# Patient Record
Sex: Male | Born: 1955 | Race: White | Hispanic: No | Marital: Married | Smoking: Never smoker
Health system: Southern US, Community
[De-identification: ages and names within clinical notes are randomized; demographics above are authoritative.]

## PROBLEM LIST (undated history)

## (undated) DIAGNOSIS — J45909 Unspecified asthma, uncomplicated: Secondary | ICD-10-CM

## (undated) DIAGNOSIS — Z8719 Personal history of other diseases of the digestive system: Secondary | ICD-10-CM

## (undated) DIAGNOSIS — M1990 Unspecified osteoarthritis, unspecified site: Secondary | ICD-10-CM

## (undated) DIAGNOSIS — K859 Acute pancreatitis without necrosis or infection, unspecified: Secondary | ICD-10-CM

## (undated) DIAGNOSIS — K851 Biliary acute pancreatitis without necrosis or infection: Secondary | ICD-10-CM

## (undated) DIAGNOSIS — A0472 Enterocolitis due to Clostridium difficile, not specified as recurrent: Secondary | ICD-10-CM

## (undated) DIAGNOSIS — K469 Unspecified abdominal hernia without obstruction or gangrene: Secondary | ICD-10-CM

## (undated) DIAGNOSIS — G35 Multiple sclerosis: Secondary | ICD-10-CM

## (undated) DIAGNOSIS — Z9889 Other specified postprocedural states: Secondary | ICD-10-CM

## (undated) DIAGNOSIS — I10 Essential (primary) hypertension: Secondary | ICD-10-CM

## (undated) DIAGNOSIS — M545 Low back pain, unspecified: Secondary | ICD-10-CM

## (undated) HISTORY — DX: Multiple sclerosis: G35

## (undated) HISTORY — DX: Biliary acute pancreatitis without necrosis or infection: K85.10

## (undated) HISTORY — PX: KNEE ARTHROSCOPY: SUR90

## (undated) HISTORY — DX: Personal history of other diseases of the digestive system: Z87.19

## (undated) HISTORY — DX: Enterocolitis due to Clostridium difficile, not specified as recurrent: A04.72

## (undated) HISTORY — PX: HERNIA REPAIR: SHX51

## (undated) HISTORY — DX: Low back pain, unspecified: M54.50

## (undated) HISTORY — DX: Low back pain: M54.5

## (undated) HISTORY — PX: TONSILLECTOMY: SUR1361

## (undated) HISTORY — DX: Other specified postprocedural states: Z98.890

## (undated) HISTORY — DX: Acute pancreatitis without necrosis or infection, unspecified: K85.90

## (undated) HISTORY — PX: NOSE SURGERY: SHX723

## (undated) HISTORY — PX: WISDOM TOOTH EXTRACTION: SHX21

## (undated) HISTORY — DX: Unspecified abdominal hernia without obstruction or gangrene: K46.9

## (undated) HISTORY — PX: CYSTECTOMY: SUR359

## (undated) HISTORY — DX: Essential (primary) hypertension: I10

---

## 2000-02-03 ENCOUNTER — Ambulatory Visit (HOSPITAL_COMMUNITY): Admission: RE | Admit: 2000-02-03 | Discharge: 2000-02-03 | Payer: Self-pay | Admitting: Internal Medicine

## 2000-02-03 ENCOUNTER — Encounter: Payer: Self-pay | Admitting: Internal Medicine

## 2000-03-06 ENCOUNTER — Ambulatory Visit (HOSPITAL_BASED_OUTPATIENT_CLINIC_OR_DEPARTMENT_OTHER): Admission: RE | Admit: 2000-03-06 | Discharge: 2000-03-06 | Payer: Self-pay | Admitting: Orthopedic Surgery

## 2000-03-06 ENCOUNTER — Encounter (INDEPENDENT_AMBULATORY_CARE_PROVIDER_SITE_OTHER): Payer: Self-pay | Admitting: Specialist

## 2000-09-11 ENCOUNTER — Encounter: Admission: RE | Admit: 2000-09-11 | Discharge: 2000-09-11 | Payer: Self-pay | Admitting: General Surgery

## 2000-09-11 ENCOUNTER — Encounter: Payer: Self-pay | Admitting: General Surgery

## 2000-09-13 ENCOUNTER — Ambulatory Visit (HOSPITAL_BASED_OUTPATIENT_CLINIC_OR_DEPARTMENT_OTHER): Admission: RE | Admit: 2000-09-13 | Discharge: 2000-09-13 | Payer: Self-pay | Admitting: General Surgery

## 2000-09-13 ENCOUNTER — Encounter (INDEPENDENT_AMBULATORY_CARE_PROVIDER_SITE_OTHER): Payer: Self-pay | Admitting: *Deleted

## 2001-10-03 ENCOUNTER — Ambulatory Visit (HOSPITAL_COMMUNITY): Admission: RE | Admit: 2001-10-03 | Discharge: 2001-10-03 | Payer: Self-pay | Admitting: Neurosurgery

## 2001-10-03 ENCOUNTER — Encounter: Payer: Self-pay | Admitting: Internal Medicine

## 2005-08-18 ENCOUNTER — Ambulatory Visit: Payer: Self-pay | Admitting: Internal Medicine

## 2005-09-12 ENCOUNTER — Ambulatory Visit (HOSPITAL_COMMUNITY): Admission: RE | Admit: 2005-09-12 | Discharge: 2005-09-12 | Payer: Self-pay | Admitting: Internal Medicine

## 2005-12-02 ENCOUNTER — Ambulatory Visit (HOSPITAL_COMMUNITY): Admission: RE | Admit: 2005-12-02 | Discharge: 2005-12-02 | Payer: Self-pay | Admitting: Internal Medicine

## 2005-12-02 ENCOUNTER — Ambulatory Visit: Payer: Self-pay | Admitting: Internal Medicine

## 2006-05-08 ENCOUNTER — Ambulatory Visit: Payer: Self-pay | Admitting: Internal Medicine

## 2006-05-08 LAB — CONVERTED CEMR LAB
ALT: 11 units/L (ref 0–40)
AST: 14 units/L (ref 0–37)
Albumin: 3.8 g/dL (ref 3.5–5.2)
Alkaline Phosphatase: 64 units/L (ref 39–117)
BUN: 7 mg/dL (ref 6–23)
Basophils Absolute: 0 10*3/uL (ref 0.0–0.1)
Calcium: 9.2 mg/dL (ref 8.4–10.5)
Chloride: 101 meq/L (ref 96–112)
Cholesterol: 167 mg/dL (ref 0–200)
Creatinine, Ser: 0.8 mg/dL (ref 0.4–1.5)
Eosinophils Absolute: 0.1 10*3/uL (ref 0.0–0.6)
GFR calc non Af Amer: 108 mL/min
HDL: 41.6 mg/dL (ref >39.0)
Hemoglobin, Urine: NEGATIVE
Ketones, ur: NEGATIVE mg/dL
LDL Cholesterol: 105 mg/dL — ABNORMAL HIGH (ref 0–99)
MCHC: 33.5 g/dL (ref 30.0–36.0)
MCV: 91.4 fL (ref 78.0–100.0)
Nitrite: NEGATIVE
PSA: 0.97 ng/mL (ref 0.10–4.00)
Platelets: 193 10*3/uL (ref 150–400)
RBC: 4.73 M/uL (ref 4.22–5.81)
RDW: 11.5 % (ref 11.5–14.6)
Sodium: 139 meq/L (ref 135–145)
Total Bilirubin: 0.6 mg/dL (ref 0.3–1.2)
Total CHOL/HDL Ratio: 4
Urine Glucose: NEGATIVE mg/dL
Urobilinogen, UA: 0.2 (ref 0.0–1.0)
VLDL: 20 mg/dL (ref 0–40)
pH: 8 (ref 5.0–8.0)

## 2006-05-17 ENCOUNTER — Ambulatory Visit: Payer: Self-pay | Admitting: Internal Medicine

## 2006-05-17 ENCOUNTER — Ambulatory Visit (HOSPITAL_COMMUNITY): Admission: RE | Admit: 2006-05-17 | Discharge: 2006-05-17 | Payer: Self-pay | Admitting: Internal Medicine

## 2006-05-21 ENCOUNTER — Ambulatory Visit: Payer: Self-pay | Admitting: Internal Medicine

## 2006-06-01 ENCOUNTER — Ambulatory Visit: Payer: Self-pay | Admitting: Internal Medicine

## 2006-06-01 LAB — CONVERTED CEMR LAB
Anti Nuclear Antibody(ANA): NEGATIVE
Rhuematoid fact SerPl-aCnc: <20 intl units/mL — ABNORMAL LOW (ref 0.0–20.0)
Vit D, 1,25-Dihydroxy: 24 (ref 20–57)

## 2006-06-09 ENCOUNTER — Encounter: Admission: RE | Admit: 2006-06-09 | Discharge: 2006-06-09 | Payer: Self-pay | Admitting: Internal Medicine

## 2007-04-04 ENCOUNTER — Encounter: Payer: Self-pay | Admitting: Internal Medicine

## 2007-06-07 ENCOUNTER — Ambulatory Visit (HOSPITAL_COMMUNITY)
Admission: RE | Admit: 2007-06-07 | Discharge: 2007-06-07 | Payer: Self-pay | Admitting: Physical Medicine and Rehabilitation

## 2007-06-20 ENCOUNTER — Encounter: Payer: Self-pay | Admitting: Internal Medicine

## 2007-06-27 ENCOUNTER — Encounter: Payer: Self-pay | Admitting: Internal Medicine

## 2007-07-04 ENCOUNTER — Encounter: Admission: RE | Admit: 2007-07-04 | Discharge: 2007-07-04 | Payer: Self-pay | Admitting: Neurology

## 2007-07-24 ENCOUNTER — Encounter (INDEPENDENT_AMBULATORY_CARE_PROVIDER_SITE_OTHER): Payer: Self-pay | Admitting: Neurology

## 2007-07-24 ENCOUNTER — Ambulatory Visit (HOSPITAL_COMMUNITY): Admission: RE | Admit: 2007-07-24 | Discharge: 2007-07-24 | Payer: Self-pay | Admitting: Neurology

## 2008-05-14 ENCOUNTER — Ambulatory Visit: Payer: Self-pay | Admitting: Internal Medicine

## 2008-05-14 DIAGNOSIS — R059 Cough, unspecified: Secondary | ICD-10-CM | POA: Insufficient documentation

## 2008-05-14 DIAGNOSIS — G35 Multiple sclerosis: Secondary | ICD-10-CM | POA: Insufficient documentation

## 2008-05-14 DIAGNOSIS — R05 Cough: Secondary | ICD-10-CM

## 2008-08-24 ENCOUNTER — Emergency Department (HOSPITAL_COMMUNITY): Admission: EM | Admit: 2008-08-24 | Discharge: 2008-08-24 | Payer: Self-pay | Admitting: Emergency Medicine

## 2008-10-02 ENCOUNTER — Emergency Department (HOSPITAL_BASED_OUTPATIENT_CLINIC_OR_DEPARTMENT_OTHER): Admission: EM | Admit: 2008-10-02 | Discharge: 2008-10-02 | Payer: Self-pay | Admitting: Emergency Medicine

## 2008-10-02 ENCOUNTER — Ambulatory Visit: Payer: Self-pay | Admitting: Diagnostic Radiology

## 2009-04-13 ENCOUNTER — Ambulatory Visit: Payer: Self-pay | Admitting: Internal Medicine

## 2009-04-13 DIAGNOSIS — J069 Acute upper respiratory infection, unspecified: Secondary | ICD-10-CM

## 2009-04-19 ENCOUNTER — Ambulatory Visit: Payer: Self-pay | Admitting: Internal Medicine

## 2009-04-19 ENCOUNTER — Encounter: Payer: Self-pay | Admitting: Internal Medicine

## 2009-04-19 DIAGNOSIS — J45901 Unspecified asthma with (acute) exacerbation: Secondary | ICD-10-CM

## 2009-05-03 ENCOUNTER — Ambulatory Visit: Payer: Self-pay | Admitting: Internal Medicine

## 2009-06-09 ENCOUNTER — Ambulatory Visit: Payer: Self-pay | Admitting: Internal Medicine

## 2009-06-09 LAB — CONVERTED CEMR LAB
AST: 32 units/L (ref 0–37)
BUN: 9 mg/dL (ref 6–23)
Basophils Absolute: 0.1 10*3/uL (ref 0.0–0.1)
Bilirubin, Direct: 0 mg/dL (ref 0.0–0.3)
Cholesterol: 191 mg/dL (ref 0–200)
Eosinophils Absolute: 0.1 10*3/uL (ref 0.0–0.7)
GFR calc non Af Amer: 82.65 mL/min (ref >60)
Leukocytes, UA: NEGATIVE
Lymphocytes Relative: 30.1 % (ref 12.0–46.0)
MCHC: 34.3 g/dL (ref 30.0–36.0)
Neutrophils Relative %: 55.8 % (ref 43.0–77.0)
Nitrite: NEGATIVE
PSA: 0.99 ng/mL (ref 0.10–4.00)
Potassium: 4.5 meq/L (ref 3.5–5.1)
RBC: 4.34 M/uL (ref 4.22–5.81)
RDW: 12.5 % (ref 11.5–14.6)
Specific Gravity, Urine: 1.01 (ref 1.000–1.030)
TSH: 1.72 microintl units/mL (ref 0.35–5.50)
Total Bilirubin: 0.5 mg/dL (ref 0.3–1.2)
Total CHOL/HDL Ratio: 5
Triglycerides: 263 mg/dL — ABNORMAL HIGH (ref 0.0–149.0)
Urobilinogen, UA: 0.2 (ref 0.0–1.0)
VLDL: 52.6 mg/dL — ABNORMAL HIGH (ref 0.0–40.0)
Vitamin B-12: 494 pg/mL (ref 211–911)

## 2009-06-14 ENCOUNTER — Ambulatory Visit: Payer: Self-pay | Admitting: Internal Medicine

## 2009-06-14 DIAGNOSIS — M545 Low back pain, unspecified: Secondary | ICD-10-CM | POA: Insufficient documentation

## 2009-06-14 DIAGNOSIS — E559 Vitamin D deficiency, unspecified: Secondary | ICD-10-CM

## 2009-06-14 DIAGNOSIS — L219 Seborrheic dermatitis, unspecified: Secondary | ICD-10-CM | POA: Insufficient documentation

## 2009-08-04 ENCOUNTER — Encounter: Admission: RE | Admit: 2009-08-04 | Discharge: 2009-11-01 | Payer: Self-pay | Admitting: Neurology

## 2009-09-28 ENCOUNTER — Encounter (INDEPENDENT_AMBULATORY_CARE_PROVIDER_SITE_OTHER): Payer: Self-pay | Admitting: *Deleted

## 2009-10-13 ENCOUNTER — Telehealth: Payer: Self-pay | Admitting: Internal Medicine

## 2009-10-15 ENCOUNTER — Ambulatory Visit: Payer: Self-pay | Admitting: Internal Medicine

## 2009-10-15 DIAGNOSIS — G4700 Insomnia, unspecified: Secondary | ICD-10-CM

## 2009-10-28 ENCOUNTER — Encounter: Payer: Self-pay | Admitting: Internal Medicine

## 2010-01-28 ENCOUNTER — Telehealth: Payer: Self-pay | Admitting: Internal Medicine

## 2010-03-08 NOTE — Letter (Signed)
Summary: Out of Work  [facility]  [address] [place], [place] [address]   Phone: [phone]  Fax: [phone]    April 19, 2009   Employee:  [name] [facility] [employer]    To Whom It May Concern:   For Medical reasons, please excuse the above named employee from work for the following dates:  Start:   04/13/09  End:   04/26/09  If you need additional information, please feel free to contact our office.         Sincerely,    Etta Grandchild MD

## 2010-03-08 NOTE — Letter (Signed)
Summary: [facility]  [facility]   Imported By: Lester  11/08/2009 09:40:00  _____________________________________________________________________  External Attachment:    Type:   Image     Comment:   External Document

## 2010-03-08 NOTE — Assessment & Plan Note (Signed)
Summary: 4 month follow up-lb   Vital Signs:  Patient profile:   55 year old male Height:      57 inches Weight:      165 pounds BMI:     35.83 Temp:     97.2 degrees F oral Pulse rate:   72 / minute Pulse rhythm:   regular Resp:     16 per minute BP sitting:   118 / 80  (left arm) Cuff size:   regular  Vitals Entered By: Lanier Prude, Beverly Gust) (October 15, 2009 7:52 AM) CC: f/u Is Patient Diabetic? No   Primary Care Provider:  Tresa Garter MD  CC:  f/u.  History of Present Illness: F/u MS and LBP; hard to sleep  Current Medications (verified): 1)  Copaxone 20 Mg/ml Kit (Glatiramer Acetate) .... Take 1 Injection Q Qhs 2)  Multivitamins  Tabs (Multiple Vitamin) .... Take 1 By Mouth Qd 3)  Zyrtec Allergy 10 Mg Tabs (Cetirizine Hcl) .[name].. 1 By Mouth Daily As Needed For Allergies 4)  Vitamin D 1000 Unit Tabs (Cholecalciferol) .... 2 By Mouth Once Daily 5)  Tramadol Hcl 50 Mg Tabs (Tramadol Hcl) .[name].. 1-2 Tabs By Mouth Two Times A Day As Needed Pain 6)  Prednisone 10 Mg Tabs (Prednisone) .[name].. 1 Once Daily  Allergies (verified): No Known Drug Allergies  Past History:  Past Medical History: Last updated: 06/14/2009 MS - dx 6/09 - Dr. Vickey Huger Ortho Dr Ranell Patrick [place] Vit D def Low back pain  Social History: Last updated: 06/14/2009 Marital Status: Married Occupation: Artist, retired Hotel manager since 1995 Never Smoked Alcohol use-no Drug use-no Regular exercise-yes Able to drive  Review of Systems       The patient complains of difficulty walking and depression.  The patient denies fever and chest pain.    Physical Exam  General:  alert, well-developed, well-nourished, and cooperative to examination.    Head:  normocephalic, atraumatic, no abnormalities observed, and no abnormalities palpated.   Nose:  nasal discharge  and mucosal erythema.  no airflow obstruction, no intranasal foreign body, no nasal polyps, no nasal mucosal lesions, no mucosal  friability, no active bleeding or clots, no sinus percussion tenderness, no septum abnormalities, nasal dischargemucosal pallor, and mucosal erythema.   Mouth:  good dentition, pharynx pink and moist, no erythema, no exudates, no posterior lymphoid hypertrophy, no postnasal drip, no pharyngeal crowing, no lesions, no aphthous ulcers, no erosions, no tongue abnormalities, no leukoplakia, and no petechiae.   Neck:  supple, full ROM, no masses, no thyromegaly, no JVD, normal carotid upstroke, no carotid bruits, no cervical lymphadenopathy, and no neck tenderness.   Lungs:  normal respiratory effort, no intercostal retractions, no accessory muscle use, normal breath sounds, no dullness, no fremitus, no crackles, and no wheezes.   Heart:  normal rate, regular rhythm, no murmur, no gallop, no rub, and no JVD.   Abdomen:  soft, non-tender, normal bowel sounds, no distention, no masses, no guarding, no rigidity, no rebound tenderness, no abdominal hernia, no inguinal hernia, no hepatomegaly, and no splenomegaly.   Msk:  No deformity or scoliosis noted of thoracic or lumbar spine.   Neurologic:  No cranial nerve deficits noted. He is ataxic. LE weak B. Skin:  turgor normal, color normal,  no ecchymoses, no petechiae, no purpura, no ulcerations, and no edema.  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 Psych:  memory intact for recent and remote, normally interactive, good eye contact, and not anxious  appearing.     Impression & Recommendations:  Problem # 1:  LOW BACK PAIN (ICD-724.2) Assessment Unchanged  His updated medication list for this problem includes:    Tramadol Hcl 50 Mg Tabs (Tramadol hcl) .[name]... 1-2 tabs by mouth two times a day as needed pain  Problem # 2:  MULTIPLE SCLEROSIS (ICD-340) Assessment: Unchanged On the regimen of medicine(s) reflected in the chart   Try Metanx  Problem # 3:  VITAMIN D DEFICIENCY (ICD-268.9) Assessment: Improved  Problem # 4:  INSOMNIA, CHRONIC  (ICD-307.42) Assessment: Unchanged Neurontin option was discussed  Complete Medication List: 1)  Copaxone 20 Mg/ml Kit (Glatiramer acetate) .... Take 1 injection q qhs 2)  Multivitamins Tabs (Multiple vitamin) .... Take 1 by mouth qd 3)  Zyrtec Allergy 10 Mg Tabs (Cetirizine hcl) .[name].. 1 by mouth daily as needed for allergies 4)  Vitamin D 1000 Unit Tabs (Cholecalciferol) .... 2 by mouth once daily 5)  Tramadol Hcl 50 Mg Tabs (Tramadol hcl) .[name].. 1-2 tabs by mouth two times a day as needed pain 6)  Prednisone 10 Mg Tabs (Prednisone) .[name].. 1 once daily 7)  Metanx 3-35-2 Mg Tabs (L-methylfolate-b6-b12) .... 2 by mouth qd  Patient Instructions: 1)  Please schedule a follow-up appointment in 6 months well w/labs and Vit D and Vit B12 782.0  268.9. Prescriptions: TRAMADOL HCL 50 MG TABS (TRAMADOL HCL) 1-2 tabs by mouth two times a day as needed pain  #120 x 6   Entered and Authorized by:   Tresa Garter MD   Signed by:   Tresa Garter MD on 10/15/2009   Method used:   Print then Give to Patient   RxID:   [id number] VITAMIN D 1000 UNIT TABS (CHOLECALCIFEROL) 2 by mouth once daily  #200 x 3   Entered and Authorized by:   Tresa Garter MD   Signed by:   Tresa Garter MD on 10/15/2009   Method used:   Print then Give to Patient   RxID:   [id number] METANX 3-35-2 MG TABS (L-METHYLFOLATE-B6-B12) 2 by mouth qd  #60 x 12   Entered and Authorized by:   Tresa Garter MD   Signed by:   Tresa Garter MD on 10/15/2009   Method used:   Print then Give to Patient   RxID:   [id number]

## 2010-03-08 NOTE — Assessment & Plan Note (Signed)
Summary: 2 WK ROV /NWS   Vital Signs:  Patient profile:   55 year old male Height:      57 inches Weight:      167 pounds BMI:     36.27 O2 Sat:      97 % on Room air Temp:     98.0 degrees F oral Pulse rate:   62 / minute Pulse rhythm:   regular Resp:     16 per minute BP sitting:   124 / 82  (right arm) Cuff size:   large  Vitals Entered By: Rock Nephew CMA (May 03, 2009 8:24 AM)  Nutrition Counseling: Patient's BMI is greater than 25 and therefore counseled on weight management options.  O2 Flow:  Room air CC: follow-up visit   Primary Care Provider:  Tresa Garter MD  CC:  follow-up visit.  History of Present Illness: He returns for f/up and states that he feels much better.  Asthma History    Initial Asthma Severity Rating:    Age range: 12+ years    Symptoms: 0-2 days/week    Nighttime Awakenings: 0-2/month    Interferes w/ normal activity: no limitations    SABA use (not for EIB): 0-2 days/week    Asthma Severity Assessment: Intermittent   Preventive Screening-Counseling & Management  Alcohol-Tobacco     Alcohol drinks/day: 0     Smoking Status: never  Medications Prior to Update: 1)  Copaxone 20 Mg/ml Kit (Glatiramer Acetate) .... Take 1 Injection Q Qhs 2)  Multivitamins  Tabs (Multiple Vitamin) .... Take 1 By Mouth Qd 3)  Zyrtec Allergy 10 Mg Tabs (Cetirizine Hcl) .[name].. 1 By Mouth Daily As Needed For Allergies 4)  Ampyra 10 Mg Xr12h-Tab (Dalfampridine) .... Two Times A Day 5)  Vitamin D 1000 Unit Tabs (Cholecalciferol) .[name].. 1 By Mouth Qd 6)  Dulera 100-5 Mcg/act Aero (Mometasone Furo-Formoterol Fum) .... 2 Puffs Two Times A Day  Current Medications (verified): 1)  Copaxone 20 Mg/ml Kit (Glatiramer Acetate) .... Take 1 Injection Q Qhs 2)  Multivitamins  Tabs (Multiple Vitamin) .... Take 1 By Mouth Qd 3)  Zyrtec Allergy 10 Mg Tabs (Cetirizine Hcl) .[name].. 1 By Mouth Daily As Needed For Allergies 4)  Vitamin D 1000 Unit Tabs (Cholecalciferol)  .[name].. 1 By Mouth Qd 5)  Dulera 100-5 Mcg/act Aero (Mometasone Furo-Formoterol Fum) .... 2 Puffs Two Times A Day  Allergies (verified): No Known Drug Allergies  Past History:  Past Medical History: Reviewed history from 04/13/2009 and no changes required. MS - dx 6/09 - Dr. Richardean Chimera Ortho Dr Ranell Patrick  Past Surgical History: Reviewed history from 04/19/2009 and no changes required. Tonsillectomy  Family History: Reviewed history from 04/13/2009 and no changes required. No CAD  Social History: Reviewed history from 04/19/2009 and no changes required. 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   Resp:  Denies chest discomfort, chest pain with inspiration, cough, coughing up blood, hypersomnolence, morning headaches, pleuritic, shortness of breath, sputum productive, and wheezing.  Physical Exam  General:  alert, well-developed, well-nourished, and cooperative to examination.    Head:  normocephalic, atraumatic, no abnormalities observed, and no abnormalities palpated.   Mouth:  good dentition, pharynx pink and moist, no erythema, no exudates, no posterior lymphoid  hypertrophy, no postnasal drip, no pharyngeal crowing, no lesions, no aphthous ulcers, no erosions, no tongue abnormalities, no leukoplakia, and no petechiae.   Neck:  supple, full ROM, no masses, no thyromegaly, no JVD, normal carotid upstroke, no carotid bruits, no cervical lymphadenopathy, and no neck tenderness.   Lungs:  normal respiratory effort, no intercostal retractions, no accessory muscle use, normal breath sounds, no dullness, no fremitus, no crackles, and no wheezes.   Heart:  normal rate,  regular rhythm, no murmur, no gallop, no rub, and no JVD.   Abdomen:  soft, non-tender, normal bowel sounds, no distention, no masses, no guarding, no rigidity, no rebound tenderness, no abdominal hernia, no inguinal hernia, no hepatomegaly, and no splenomegaly.   Msk:  No deformity or scoliosis noted of thoracic or lumbar spine.   Pulses:  R and L carotid,radial,femoral,dorsalis pedis and posterior tibial pulses are full and equal bilaterally Extremities:  No clubbing, cyanosis, edema, or deformity noted with normal full range of motion of all joints.   Neurologic:  No cranial nerve deficits noted. Station and gait are normal. Plantar reflexes are down-going bilaterally. DTRs are symmetrical throughout. Sensory, motor and coordinative functions appear intact. Skin:  turgor normal, color normal, no suspicious lesions, no ecchymoses, no petechiae, no purpura, no ulcerations, and no edema.  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 Cervical Nodes:  no anterior cervical adenopathy and no posterior cervical adenopathy.   Psych:  memory intact for recent and remote, normally interactive, good eye contact, and not anxious appearing.     Impression & Recommendations:  Problem # 1:  ASTHMA NOS W/ACUTE EXACERBATION (ICD-493.92) Assessment Improved  His updated medication list for this problem includes:    Dulera 100-5 Mcg/act Aero (Mometasone furo-formoterol fum) .[name]... 2 puffs two times a day  Pulmonary Functions Reviewed: O2 sat: 97 (05/03/2009)  Problem # 2:  UPPER RESPIRATORY INFECTION, ACUTE (ICD-465.9) Assessment: Improved  His updated medication list for this problem includes:    Zyrtec Allergy 10 Mg Tabs (Cetirizine hcl) .[name]... 1 by mouth daily as needed for allergies  Complete Medication List: 1)  Copaxone 20 Mg/ml Kit (Glatiramer acetate) .... Take 1 injection q qhs 2)  Multivitamins Tabs (Multiple vitamin) .... Take 1 by mouth qd 3)  Zyrtec Allergy 10 Mg Tabs (Cetirizine hcl) .[name].. 1 by mouth  daily as needed for allergies 4)  Vitamin D 1000 Unit Tabs (Cholecalciferol) .[name].. 1 by mouth qd 5)  Dulera 100-5 Mcg/act Aero (Mometasone furo-formoterol fum) .... 2 puffs two times a day  Patient Instructions: 1)  Please schedule a follow-up appointment in 4 months. 2)  It is important to use your inhaler properly. Use a spacer, take slow deep breaths and hold them. Rinse your mouth after using.

## 2010-03-08 NOTE — Letter (Signed)
Summary: Referral - not able to see patient  [facility]  [address] [place], [place] [address]   Phone: [phone]  Fax: [phone]    September 28, 2009    [name], M.D. [address] [place], [place] [address]    Re:   [name] DOB:  04-Sep-1955 MRN:   [id number]    Dear Dr. Posey Rea:  Thank you for your kind referral of the above patient.  We have attempted to schedule the recommended procedure Screening Colonoscopy but have not been able to schedule because:   X  The patient was not available by phone and/or has not returned our calls.  ___ The patient declined to schedule the procedure at this time.  We appreciate the referral and hope that we will have the opportunity to treat this patient in the future.    Sincerely,    [facility] [phone]

## 2010-03-08 NOTE — Miscellaneous (Signed)
Summary: [facility]  [facility]   Imported By: Lester Zena 04/21/2009 07:44:17  _____________________________________________________________________  External Attachment:    Type:   Image     Comment:   External Document

## 2010-03-08 NOTE — Assessment & Plan Note (Signed)
Summary: cough,getting worse/plot pt/cd   Vital Signs:  Patient profile:   55 year old male Height:      5.7 inches Weight:      165 pounds BMI:     3583.47 O2 Sat:      96 % on Room air Temp:     97.6 degrees F oral Pulse rate:   66 / minute Pulse rhythm:   regular Resp:     16 per minute BP sitting:   104 / 78  (left arm) Cuff size:   large  Vitals Entered By: Rock Nephew CMA (April 19, 2009 8:59 AM)  Nutrition Counseling: Patient's BMI is greater than 25 and therefore counseled on weight management options.  O2 Flow:  Room air CC: continued cough w/ yellow mucus and occ fever x 04/13/2009, URI symptoms Is Patient Diabetic? No Pain Assessment Patient in pain? no        Primary Care Provider:  Georgina Quint Plotnikov MD  CC:  continued cough w/ yellow mucus and occ fever x 04/13/2009 and URI symptoms.  History of Present Illness:  URI Symptoms      This is a 55 year old man who presents with URI symptoms.  The symptoms began 3 weeks ago.  The severity is described as moderate.  The patient reports productive cough, but denies purulent nasal discharge and sore throat.  Associated symptoms include fever and wheezing.  The patient denies stiff neck, dyspnea, rash, vomiting, diarrhea, use of an antipyretic, and response to antipyretic.  The patient denies sneezing, seasonal symptoms, headache, muscle aches, and severe fatigue.  The patient denies the following risk factors for Strep sinusitis: unilateral facial pain, unilateral nasal discharge, double sickening, Strep exposure, tender adenopathy, and absence of cough.    Preventive Screening-Counseling & Management  Alcohol-Tobacco     Alcohol drinks/day: 0     Smoking Status: never  Caffeine-Diet-Exercise     Does Patient Exercise: yes  Hep-HIV-STD-Contraception     Hepatitis Risk: no risk noted     HIV Risk: no risk noted     STD Risk: no risk noted      Sexual History:  currently monogamous.        Drug Use:  no.          Blood Transfusions:  no.    Medications Prior to Update: 1)  Copaxone 20 Mg/ml Kit (Glatiramer Acetate) .... Take 1 Injection Q Qhs 2)  Multivitamins  Tabs (Multiple Vitamin) .... Take 1 By Mouth Qd 3)  Zyrtec Allergy 10 Mg Tabs (Cetirizine Hcl) .[name].. 1 By Mouth Daily As Needed For Allergies 4)  Ampyra 10 Mg Xr12h-Tab (Dalfampridine) .... Two Times A Day 5)  Doxycycline Monohydrate 100 Mg Caps (Doxycycline Monohydrate) .[name].. 1 By Mouth Two Times A Day With A Glass of Water 6)  Promethazine-Codeine 6.25-10 Mg/37ml Syrp (Promethazine-Codeine) .... 5-10 Ml By Mouth Q Id As Needed Cough 7)  Vitamin D 1000 Unit Tabs (Cholecalciferol) .[name].. 1 By Mouth Qd  Current Medications (verified): 1)  Copaxone 20 Mg/ml Kit (Glatiramer Acetate) .... Take 1 Injection Q Qhs 2)  Multivitamins  Tabs (Multiple Vitamin) .... Take 1 By Mouth Qd 3)  Zyrtec Allergy 10 Mg Tabs (Cetirizine Hcl) .[name].. 1 By Mouth Daily As Needed For Allergies 4)  Ampyra 10 Mg Xr12h-Tab (Dalfampridine) .... Two Times A Day 5)  Doxycycline Monohydrate 100 Mg Caps (Doxycycline Monohydrate) .[name].. 1 By Mouth Two Times A Day With A Glass of Water 6)  Vitamin D  1000 Unit Tabs (Cholecalciferol) .[name].. 1 By Mouth Qd 7)  Dulera 100-5 Mcg/act Aero (Mometasone Furo-Formoterol Fum) .... 2 Puffs Two Times A Day  Allergies (verified): No Known Drug Allergies  Past History:  Past Medical History: Reviewed history from 04/13/2009 and no changes required. MS - dx 6/09 - Dr. Richardean Chimera Ortho Dr Ranell Patrick  Past Surgical History: Tonsillectomy  Family History: Reviewed history from 04/13/2009 and no changes required. No CAD  Social History: Reviewed history from 05/14/2008 and no changes required. 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  The patient denies anorexia, weight gain, vision loss, decreased hearing, hoarseness, chest pain, syncope, peripheral edema, headaches, hemoptysis, abdominal pain, suspicious skin lesions, and enlarged lymph nodes.   Resp:  Complains of cough, shortness of breath, sputum productive, and wheezing; denies chest discomfort, chest pain with inspiration, coughing up blood, excessive snoring, hypersomnolence, morning headaches, and pleuritic.  Physical Exam  General:  alert, well-developed, well-nourished, and cooperative to examination.    Head:  normocephalic, atraumatic, no abnormalities observed, and no abnormalities palpated.   Ears:  R cerumen impaction and L Cerumen impaction.   Nose:  nasal discharge  and mucosal erythema.  no airflow obstruction, no intranasal foreign body, no nasal polyps, no nasal mucosal lesions, no mucosal friability, no active bleeding or clots, no sinus percussion tenderness, no septum abnormalities, nasal dischargemucosal pallor, and mucosal erythema.   Mouth:  good dentition, pharynx pink and moist, no erythema, no exudates, no posterior lymphoid hypertrophy, no postnasal drip, no pharyngeal crowing, no lesions, no aphthous ulcers, no erosions, no tongue abnormalities, no leukoplakia, and no petechiae.   Neck:  supple, full ROM, no masses, no thyromegaly, no JVD, normal carotid upstroke, no carotid bruits, no cervical lymphadenopathy, and no neck tenderness.   Lungs:  R wheezes and rhonchi during expiration, there is some clearing with cough.  left is CTA. there are no rales or decreased breath sounds. Heart:  normal rate, regular rhythm, no murmur, no gallop, no rub, and no JVD.   Abdomen:  soft, non-tender, normal bowel sounds, no distention, no masses, no guarding, no rigidity, no  rebound tenderness, no abdominal hernia, no inguinal hernia, no hepatomegaly, and no splenomegaly.   Msk:  No deformity or scoliosis noted of thoracic or lumbar spine.   Pulses:  R and L carotid,radial,femoral,dorsalis pedis and posterior tibial pulses are full and equal bilaterally Extremities:  No clubbing, cyanosis, edema, or deformity noted with normal full range of motion of all joints.   Neurologic:  No cranial nerve deficits noted. Station and gait are normal. Plantar reflexes are down-going bilaterally. DTRs are symmetrical throughout. Sensory, motor and coordinative functions appear intact. Skin:  turgor normal, color normal, no suspicious lesions, no ecchymoses, no petechiae, no purpura, no ulcerations, and no edema.  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 Cervical Nodes:  no anterior cervical adenopathy and no posterior cervical adenopathy.   Axillary Nodes:  no R axillary adenopathy and no L axillary adenopathy.   Inguinal Nodes:  no R inguinal adenopathy and no L inguinal adenopathy.   Psych:  memory intact for  recent and remote, normally interactive, good eye contact, and not anxious appearing.     Impression & Recommendations:  Problem # 1:  ASTHMA NOS W/ACUTE EXACERBATION (ICD-493.92) Assessment New he was witnessed to do a good inhalation of dulera in the office. His updated medication list for this problem includes:    Dulera 100-5 Mcg/act Aero (Mometasone furo-formoterol fum) .[name]... 2 puffs two times a day  Pulmonary Functions Reviewed: O2 sat: 96 (04/19/2009)  Problem # 2:  UPPER RESPIRATORY INFECTION, ACUTE (ICD-465.9) Assessment: Unchanged  The following medications were removed from the medication list:    Promethazine-codeine 6.25-10 Mg/17ml Syrp (Promethazine-codeine) .[name]... 5-10 ml by mouth q id as needed cough His updated medication list for this problem includes:    Zyrtec Allergy 10 Mg Tabs (Cetirizine hcl) .[name]... 1 by mouth daily as needed for allergies  Instructed  on symptomatic treatment. Call if symptoms persist or worsen.   Problem # 3:  COUGH (ICD-786.2) Assessment: Deteriorated will repeat the chest xray to see if pna has developed Orders: T-2 View CXR ([id number])  Complete Medication List: 1)  Copaxone 20 Mg/ml Kit (Glatiramer acetate) .... Take 1 injection q qhs 2)  Multivitamins Tabs (Multiple vitamin) .... Take 1 by mouth qd 3)  Zyrtec Allergy 10 Mg Tabs (Cetirizine hcl) .[name].. 1 by mouth daily as needed for allergies 4)  Ampyra 10 Mg Xr12h-tab (Dalfampridine) .... Two times a day 5)  Doxycycline Monohydrate 100 Mg Caps (Doxycycline monohydrate) .[name].. 1 by mouth two times a day with a glass of water 6)  Vitamin D 1000 Unit Tabs (Cholecalciferol) .[name].. 1 by mouth qd 7)  Dulera 100-5 Mcg/act Aero (Mometasone furo-formoterol fum) .... 2 puffs two times a day  Patient Instructions: 1)  Please schedule a follow-up appointment in 2 weeks. 2)  It is important to use your inhaler properly. Use a spacer, take slow deep breaths and hold them. Rinse your mouth after using.  3)  Acute bronchitis symptoms for less than 10 days are not helped by antibiotics. take over the counter cough medications. call if no improvment in  5-7 days, sooner if increasing cough, fever, or new symptoms( shortness of breath, chest pain). Prescriptions: DULERA 100-5 MCG/ACT AERO (MOMETASONE FURO-FORMOTEROL FUM) 2 puffs two times a day  #5 inhs x 0   Entered and Authorized by:   Etta Grandchild MD   Signed by:   Etta Grandchild MD on 04/19/2009   Method used:   Samples Given   RxID:   [id number]

## 2010-03-08 NOTE — Progress Notes (Signed)
Summary: SHINGLES  Phone Note Call from Patient Call back at Home Phone [phone]   Summary of Call: Pt's wife called. Dr Corky Crafts agrees with shingles vaccine. Pt would like to have vaccine at office visit on Friday. *Need to call and inform pt to confirm coverage thru insurance prior to office visit.  Initial call taken by: Lamar Sprinkles, CMA,  October 13, 2009 10:48 AM  Follow-up for Phone Call        No answer................Marland KitchenLamar Sprinkles, CMA  October 13, 2009 11:34 AM   Additional Follow-up for Phone Call Additional follow up Details #1::        Left detailed vm on hm # to check w/ins Additional Follow-up by: Lamar Sprinkles, CMA,  October 13, 2009 4:56 PM

## 2010-03-08 NOTE — Assessment & Plan Note (Signed)
Summary: ?pneumonia or flu-lb   Vital Signs:  Patient profile:   55 year old male Weight:      166 pounds BMI:     3605.19 Temp:     96.6 degrees F Pulse rate:   91 / minute BP sitting:   132 / 80  (right arm)  Vitals Entered By: Tora Perches (April 13, 2009 2:55 PM) CC: pt c/o poss, pneu. or flu Is Patient Diabetic? No   Primary Care Provider:  Tresa Garter MD  CC:  pt c/o poss and pneu. or flu.  History of Present Illness: C/o cough and heavy in the chest 3-4 d  Preventive Screening-Counseling & Management  Alcohol-Tobacco     Smoking Status: never  Current Medications (verified): 1)  Copaxone 20 Mg/ml Kit (Glatiramer Acetate) .... Take 1 Injection Q Qhs 2)  Calcium 600/vitamin D 600-400 Mg-Unit Tabs (Calcium Carbonate-Vitamin D) .... Take 1 By Mouth Qd 3)  Multivitamins  Tabs (Multiple Vitamin) .... Take 1 By Mouth Qd 4)  Zyrtec Allergy 10 Mg Tabs (Cetirizine Hcl) .[name].. 1 By Mouth Daily As Needed For Allergies 5)  Ampyra 10 Mg Xr12h-Tab (Dalfampridine) .... Two Times A Day  Allergies (verified): No Known Drug Allergies  Past History:  Past Surgical History: Last updated: 05/14/2008 none  Social History: Last updated: 05/14/2008 Marital Status: Married Children:  Occupation: Artist, retired Hotel manager since 1995  Past Medical History: MS - dx 6/09 - Dr. Richardean Chimera Ortho Dr Ranell Patrick  Family History: No CAD  Physical Exam  General:  alert, well-developed, well-nourished, and cooperative to examination.    Nose:  no external deformity and no nasal discharge.   Mouth:  Oral mucosa and oropharynx without lesions or exudates. Mild OP ertyhema. Teeth in good repair. Neck:  supple, full ROM, no masses, no thyromegaly; no thyroid nodules or tenderness. no JVD or carotid bruits.   Lungs:  Normal respiratory effort, chest expands symmetrically. Lungs are clear to auscultation, no crackles or wheezes. Heart:  normal rate, regular rhythm, no murmur,  and no rub. BLE without edema. normal DP pulses and normal cap refill in all 4 extremities    Abdomen:  soft, non-tender, normal bowel sounds, no distention; no masses and no appreciable hepatomegaly or splenomegaly.   Extremities:  No clubbing, cyanosis, edema, or deformity noted with normal full range of motion of all joints.   Neurologic:  abnormal gait d/t MS. alert & oriented X3.  ataxic gait.   Skin:  Intact without suspicious lesions or rashes Psych:  memory intact for recent and remote, normally interactive, good eye contact, and not anxious appearing.     Impression & Recommendations:  Problem # 1:  UPPER RESPIRATORY INFECTION, ACUTE (ICD-465.9) Assessment New  His updated medication list for this problem includes:    Zyrtec Allergy 10 Mg Tabs (Cetirizine hcl) .[name]... 1 by mouth daily as needed for allergies    Promethazine-codeine 6.25-10 Mg/68ml Syrp (Promethazine-codeine) .[name]... 5-10 ml by mouth q id as needed cough Sick 3/7-3/14  Problem # 2:  COUGH (ICD-786.2) Assessment: New  Orders: T-2 View CXR, Same Day (71020.5TC)  Problem # 3:  MULTIPLE SCLEROSIS (ICD-340) Assessment: New RTC well exam  Complete Medication List: 1)  Copaxone 20 Mg/ml Kit (Glatiramer acetate) .... Take 1 injection q qhs 2)  Multivitamins Tabs (Multiple vitamin) .... Take 1 by mouth qd 3)  Zyrtec Allergy 10 Mg Tabs (Cetirizine hcl) .[name].. 1 by mouth daily as needed for allergies 4)  Ampyra 10 Mg  Xr12h-tab (Dalfampridine) .... Two times a day 5)  Doxycycline Monohydrate 100 Mg Caps (Doxycycline monohydrate) .[name].. 1 by mouth two times a day with a glass of water 6)  Promethazine-codeine 6.25-10 Mg/72ml Syrp (Promethazine-codeine) .... 5-10 ml by mouth q id as needed cough 7)  Vitamin D 1000 Unit Tabs (Cholecalciferol) .[name].. 1 by mouth qd  Patient Instructions: 1)  Try to eat more raw plant food, fresh and dry fruit, raw almonds, leafy vegetables, whole foods and less red meat, less animal fat. Poultry and  fish is better for you than pork and beef. Avoid processed foods (canned soups, hot dogs, sausage, bacon , frozen dinners). Avoid corn syrup, high fructose syrup or aspartam and Splenda  containing drinks. Honey, Agave and Stevia are better sweeteners. Make your own  dressing with olive oil, wine vinegar, lemon juce, garlic etc. for your salads. 2)  Call if you are not better in a reasonable amount of time or if worse. Go to ER if feeling really bad! 3)  Please schedule a follow-up appointment in 2 months well w/labs. 4)  BMP prior to visit, ICD-9: 5)  Hepatic Panel prior to visit, ICD-9: 6)  Lipid Panel prior to visit, ICD-9: 7)  TSH prior to visit, ICD-9: 8)  CBC w/ Diff prior to visit, ICD-9: 9)  Urine-dip prior to visit, ICD-9: 10)  PSA prior to visit, ICD-9: 11)  Vit D 12)  Vit B12 v70.0 340 Prescriptions: PROMETHAZINE-CODEINE 6.25-10 MG/5ML SYRP (PROMETHAZINE-CODEINE) 5-10 ml by mouth q id as needed cough  #300 ml x 0   Entered and Authorized by:   Tresa Garter MD   Signed by:   Tresa Garter MD on 04/13/2009   Method used:   Print then Give to Patient   RxID:   [id number] DOXYCYCLINE MONOHYDRATE 100 MG CAPS (DOXYCYCLINE MONOHYDRATE) 1 by mouth two times a day with a glass of water  #20 x 0   Entered and Authorized by:   Tresa Garter MD   Signed by:   Tresa Garter MD on 04/13/2009   Method used:   Print then Give to Patient   RxID:   [id number]

## 2010-03-08 NOTE — Assessment & Plan Note (Signed)
Summary: cpx-lb   Vital Signs:  Patient profile:   55 year old male Height:      57 inches Weight:      168.25 pounds BMI:     36.54 O2 Sat:      98 % on Room air Temp:     98.2 degrees F oral Pulse rate:   71 / minute BP sitting:   120 / 76  (left arm) Cuff size:   regular  Vitals Entered By: Lucious Groves (Jun 14, 2009 10:30 AM)  O2 Flow:  Room air CC: CPX./kb Is Patient Diabetic? No Pain Assessment Patient in pain? no        Primary Care Provider:  Tresa Garter MD  CC:  CPX./kb.  History of Present Illness: The patient presents for a wellness examination   Current Medications (verified): 1)  Copaxone 20 Mg/ml Kit (Glatiramer Acetate) .... Take 1 Injection Q Qhs 2)  Multivitamins  Tabs (Multiple Vitamin) .... Take 1 By Mouth Qd 3)  Zyrtec Allergy 10 Mg Tabs (Cetirizine Hcl) .[name].. 1 By Mouth Daily As Needed For Allergies 4)  Vitamin D 1000 Unit Tabs (Cholecalciferol) .[name].. 1 By Mouth Qd 5)  Dulera 100-5 Mcg/act Aero (Mometasone Furo-Formoterol Fum) .... 2 Puffs Two Times A Day  Allergies (verified): No Known Drug Allergies  Past History:  Past Surgical History: Last updated: 04/19/2009 Tonsillectomy  Family History: Last updated: 04/13/2009 No CAD  Past Medical History: MS - dx 6/09 - Dr. Vickey Huger Ortho Dr Ranell Patrick [place] Vit D def Low back pain  Social History: Marital Status: Married Occupation: Artist, retired Hotel manager since 1995 Never Smoked Alcohol use-no Drug use-no Regular exercise-yes Able to drive  Review of Systems       The patient complains of muscle weakness, suspicious skin lesions, difficulty walking, and depression.  The patient denies anorexia, fever, weight loss, weight gain, vision loss, decreased hearing, hoarseness, chest pain, syncope, dyspnea on exertion, peripheral edema, prolonged cough, headaches, hemoptysis, abdominal pain, melena, hematochezia, severe indigestion/heartburn, hematuria, incontinence, genital  sores, transient blindness, unusual weight change, abnormal bleeding, enlarged lymph nodes, angioedema, and testicular masses.    Physical Exam  General:  alert, well-developed, well-nourished, and cooperative to examination.    Head:  normocephalic, atraumatic, no abnormalities observed, and no abnormalities palpated.   Eyes:  No corneal or conjunctival inflammation noted. EOMI. Perrla.  Ears:  R cerumen impaction and L Cerumen impaction.   Nose:  nasal discharge  and mucosal erythema.  no airflow obstruction, no intranasal foreign body, no nasal polyps, no nasal mucosal lesions, no mucosal friability, no active bleeding or clots, no sinus percussion tenderness, no septum abnormalities, nasal dischargemucosal pallor, and mucosal erythema.   Mouth:  good dentition, pharynx pink and moist, no erythema, no exudates, no posterior lymphoid hypertrophy, no postnasal drip, no pharyngeal crowing, no lesions, no aphthous ulcers, no erosions, no tongue abnormalities, no leukoplakia, and no petechiae.   Neck:  supple, full ROM, no masses, no thyromegaly, no JVD, normal carotid upstroke, no carotid bruits, no cervical lymphadenopathy, and no neck tenderness.   Lungs:  normal respiratory effort, no intercostal retractions, no accessory muscle use, normal breath sounds, no dullness, no fremitus, no crackles, and no wheezes.   Heart:  normal rate, regular rhythm, no murmur, no gallop, no rub, and no JVD.   Abdomen:  soft, non-tender, normal bowel sounds, no distention, no masses, no guarding, no rigidity, no rebound tenderness, no abdominal hernia, no inguinal hernia, no hepatomegaly, and  no splenomegaly.   Rectal:  Def to GI Prostate:  Def to GI Msk:  No deformity or scoliosis noted of thoracic or lumbar spine.   Pulses:  R and L carotid,radial,femoral,dorsalis pedis and posterior tibial pulses are full and equal bilaterally Extremities:  No clubbing, cyanosis, edema, or deformity noted with normal full range  of motion of all joints.   Neurologic:  No cranial nerve deficits noted. He is ataxic. LE weak B. Skin:  turgor normal, color normal,  no ecchymoses, no petechiae, no purpura, no ulcerations, and no edema.  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 Cervical Nodes:  no anterior cervical adenopathy and no posterior cervical adenopathy.   Inguinal Nodes:  no R inguinal adenopathy and no L inguinal adenopathy.   Psych:  memory intact for recent and remote, normally interactive, good eye contact, and not anxious appearing.     Impression & Recommendations:  Problem # 1:  PHYSICAL EXAMINATION (ICD-V70.0) Assessment New  Health and age related issues were discussed. Available screening tests and vaccinations were discussed as well. Healthy life style including good diet and execise was discussed.  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 1-2 tabs by mouth two times a day as needed pain  Complete Medication List: 1)  Copaxone 20 Mg/ml Kit (Glatiramer acetate) .... Take 1 injection q qhs 2)  Multivitamins Tabs (Multiple vitamin) .... Take 1 by mouth qd 3)  Zyrtec Allergy 10 Mg Tabs (Cetirizine hcl) .[name].. 1 by mouth daily as needed for allergies 4)  Vitamin D 1000 Unit Tabs (Cholecalciferol) .... 2 by mouth once daily 5)  Lotrisone 1-0.05 % Crea (Clotrimazole-betamethasone) .... Use bid 6)  Tramadol Hcl 50 Mg Tabs (Tramadol hcl) .[name].. 1-2 tabs by mouth two times a day as needed pain  Patient  Instructions: 1)  Use stretching and balance exercises that I have provided (15 min. or longer every day)  2)  Please schedule a follow-up appointment in 4 months. 3)  BMP prior to visit, ICD-9: 4)  Hepatic Panel prior to visit, ICD-9: 5)  Lipid Panel prior to visit, ICD-9: 995.20 6)  Vit D 268.9 Prescriptions: TRAMADOL HCL 50 MG TABS (TRAMADOL HCL) 1-2 tabs by mouth two times a day as needed pain  #120 x 6   Entered and Authorized by:   Tresa Garter MD   Signed by:   Tresa Garter MD on 06/14/2009   Method used:   Print then Give to Patient   RxID:   [id number] VITAMIN D 1000 UNIT TABS (CHOLECALCIFEROL) 2 by mouth once daily  #200 x 3   Entered and Authorized by:   Tresa Garter MD   Signed by:   Tresa Garter MD on 06/14/2009   Method used:   Print then Give to Patient   RxID:   [id number] LOTRISONE 1-0.05 % CREA (CLOTRIMAZOLE-BETAMETHASONE) use bid  #45 g x 1   Entered and Authorized by:   Tresa Garter MD   Signed by:  Tresa Garter MD on 06/14/2009   Method used:   Print then Give to Patient   RxID:   [id number]    Pneumovax Vaccine    Vaccine Type: Pneumovax    Site: right deltoid    Mfr: Merck    Dose: 0.5 ml    Route: IM    Given by: Lucious Groves    Exp. Date: 12/01/2010    Lot #: [id number]    VIS given: 09/04/95 version given Jun 14, 2009.

## 2010-03-10 NOTE — Progress Notes (Signed)
Summary: Rf Tramadol  Phone Note Refill Request Message from:  Pharmacy  Refills Requested: Medication #1:  TRAMADOL HCL 50 MG TABS 1-2 tabs by mouth two times a day as needed pain   Dosage confirmed as above?Dosage Confirmed   Supply Requested: 120   Last Refilled: 12/27/2009  Method Requested: Electronic Initial call taken by: Lanier Prude, [facility]),  January 28, 2010 12:02 PM  Follow-up for Phone Call        ok 3 ref Follow-up by: Tresa Garter MD,  January 28, 2010 1:25 PM    Prescriptions: TRAMADOL HCL 50 MG TABS (TRAMADOL HCL) 1-2 tabs by mouth two times a day as needed pain  #120 x 2   Entered by:   Margaret Pyle, CMA   Authorized by:   Tresa Garter MD   Signed by:   Margaret Pyle, CMA on 01/28/2010   Method used:   [facility]ron[facility] to        [phone][facility] (retail)       [address]       [place], [place]  [address]       Ph: [phone] or [phone]       Fax: [phone]   RxID:   [id number]

## 2010-04-05 ENCOUNTER — Encounter: Payer: Self-pay | Admitting: Internal Medicine

## 2010-04-05 ENCOUNTER — Ambulatory Visit (INDEPENDENT_AMBULATORY_CARE_PROVIDER_SITE_OTHER): Payer: Managed Care, Other (non HMO) | Admitting: Internal Medicine

## 2010-04-05 ENCOUNTER — Other Ambulatory Visit

## 2010-04-05 ENCOUNTER — Encounter (INDEPENDENT_AMBULATORY_CARE_PROVIDER_SITE_OTHER): Payer: Self-pay | Admitting: *Deleted

## 2010-04-05 ENCOUNTER — Other Ambulatory Visit: Payer: Self-pay | Admitting: Internal Medicine

## 2010-04-05 DIAGNOSIS — R109 Unspecified abdominal pain: Secondary | ICD-10-CM

## 2010-04-05 DIAGNOSIS — E559 Vitamin D deficiency, unspecified: Secondary | ICD-10-CM

## 2010-04-05 DIAGNOSIS — R51 Headache: Secondary | ICD-10-CM

## 2010-04-05 DIAGNOSIS — R269 Unspecified abnormalities of gait and mobility: Secondary | ICD-10-CM

## 2010-04-05 LAB — BASIC METABOLIC PANEL
BUN: 9 mg/dL (ref 6–23)
CO2: 29 mEq/L (ref 19–32)
Chloride: 106 mEq/L (ref 96–112)
Creatinine, Ser: 0.9 mg/dL (ref 0.4–1.5)

## 2010-04-05 LAB — URINALYSIS, ROUTINE W REFLEX MICROSCOPIC
Hgb urine dipstick: NEGATIVE
Leukocytes, UA: NEGATIVE
Nitrite: NEGATIVE
Total Protein, Urine: NEGATIVE
Urobilinogen, UA: 0.2 (ref 0.0–1.0)

## 2010-04-05 LAB — CBC WITH DIFFERENTIAL/PLATELET
Basophils Relative: 0.4 % (ref 0.0–3.0)
Eosinophils Absolute: 0.2 10*3/uL (ref 0.0–0.7)
MCHC: 34.5 g/dL (ref 30.0–36.0)
MCV: 93.5 fl (ref 78.0–100.0)
Monocytes Absolute: 0.6 10*3/uL (ref 0.1–1.0)
Neutrophils Relative %: 77.5 % — ABNORMAL HIGH (ref 43.0–77.0)
RBC: 4.32 Mil/uL (ref 4.22–5.81)

## 2010-04-05 LAB — HEPATIC FUNCTION PANEL
Albumin: 4.1 g/dL (ref 3.5–5.2)
Bilirubin, Direct: 0.1 mg/dL (ref 0.0–0.3)
Total Protein: 6.5 g/dL (ref 6.0–8.3)

## 2010-04-05 LAB — LIPASE: Lipase: 37 U/L (ref 11.0–59.0)

## 2010-04-05 LAB — CONVERTED CEMR LAB: Vit D, 25-Hydroxy: 51 ng/mL (ref 30–89)

## 2010-04-06 ENCOUNTER — Telehealth: Payer: Self-pay | Admitting: Internal Medicine

## 2010-04-06 ENCOUNTER — Other Ambulatory Visit: Payer: Self-pay | Admitting: Internal Medicine

## 2010-04-07 ENCOUNTER — Inpatient Hospital Stay (HOSPITAL_COMMUNITY): Payer: Managed Care, Other (non HMO)

## 2010-04-07 ENCOUNTER — Emergency Department (HOSPITAL_COMMUNITY): Payer: Managed Care, Other (non HMO)

## 2010-04-07 ENCOUNTER — Inpatient Hospital Stay (HOSPITAL_COMMUNITY)
Admission: EM | Admit: 2010-04-07 | Discharge: 2010-04-10 | DRG: 419 | Disposition: A | Discharge status: Home / Self Care | Payer: Managed Care, Other (non HMO) | Attending: Internal Medicine | Admitting: Internal Medicine

## 2010-04-07 DIAGNOSIS — K859 Acute pancreatitis without necrosis or infection, unspecified: Secondary | ICD-10-CM

## 2010-04-07 DIAGNOSIS — R932 Abnormal findings on diagnostic imaging of liver and biliary tract: Secondary | ICD-10-CM

## 2010-04-07 DIAGNOSIS — R29898 Other symptoms and signs involving the musculoskeletal system: Secondary | ICD-10-CM | POA: Diagnosis present

## 2010-04-07 DIAGNOSIS — J3489 Other specified disorders of nose and nasal sinuses: Secondary | ICD-10-CM | POA: Diagnosis not present

## 2010-04-07 DIAGNOSIS — G35 Multiple sclerosis: Secondary | ICD-10-CM | POA: Diagnosis present

## 2010-04-07 DIAGNOSIS — K8050 Calculus of bile duct without cholangitis or cholecystitis without obstruction: Secondary | ICD-10-CM | POA: Diagnosis present

## 2010-04-07 DIAGNOSIS — K8020 Calculus of gallbladder without cholecystitis without obstruction: Secondary | ICD-10-CM

## 2010-04-07 HISTORY — PX: CHOLECYSTECTOMY: SHX55

## 2010-04-07 LAB — COMPREHENSIVE METABOLIC PANEL
Alkaline Phosphatase: 93 U/L (ref 39–117)
BUN: 5 mg/dL — ABNORMAL LOW (ref 6–23)
CO2: 27 mEq/L (ref 19–32)
Calcium: 9.1 mg/dL (ref 8.4–10.5)
GFR calc non Af Amer: >60 mL/min (ref >60)
Glucose, Bld: 112 mg/dL — ABNORMAL HIGH (ref 70–99)
Total Protein: 6.7 g/dL (ref 6.0–8.3)

## 2010-04-07 LAB — URINALYSIS, ROUTINE W REFLEX MICROSCOPIC
Leukocytes, UA: NEGATIVE
Nitrite: NEGATIVE
Protein, ur: NEGATIVE mg/dL
Urine Glucose, Fasting: NEGATIVE mg/dL

## 2010-04-07 LAB — CBC
HCT: 38.7 % — ABNORMAL LOW (ref 39.0–52.0)
Hemoglobin: 13.6 g/dL (ref 13.0–17.0)
MCHC: 35.1 g/dL (ref 30.0–36.0)
MCV: 89.2 fL (ref 78.0–100.0)
RDW: 12.3 % (ref 11.5–15.5)

## 2010-04-07 LAB — DIFFERENTIAL
Eosinophils Relative: 1 % (ref 0–5)
Lymphocytes Relative: 14 % (ref 12–46)
Lymphs Abs: 1.1 10*3/uL (ref 0.7–4.0)
Monocytes Absolute: 0.7 10*3/uL (ref 0.1–1.0)
Monocytes Relative: 8 % (ref 3–12)

## 2010-04-07 LAB — URINE MICROSCOPIC-ADD ON

## 2010-04-08 ENCOUNTER — Other Ambulatory Visit

## 2010-04-08 ENCOUNTER — Other Ambulatory Visit: Payer: Self-pay

## 2010-04-08 LAB — COMPREHENSIVE METABOLIC PANEL
BUN: 5 mg/dL — ABNORMAL LOW (ref 6–23)
CO2: 24 mEq/L (ref 19–32)
Calcium: 8.5 mg/dL (ref 8.4–10.5)
Chloride: 109 mEq/L (ref 96–112)
Creatinine, Ser: 0.75 mg/dL (ref 0.4–1.5)
GFR calc Af Amer: >60 mL/min (ref >60)
GFR calc non Af Amer: >60 mL/min (ref >60)
Glucose, Bld: 94 mg/dL (ref 70–99)
Total Bilirubin: 1.1 mg/dL (ref 0.3–1.2)

## 2010-04-08 LAB — CBC
HCT: 34.4 % — ABNORMAL LOW (ref 39.0–52.0)
Hemoglobin: 11.9 g/dL — ABNORMAL LOW (ref 13.0–17.0)
MCH: 30.9 pg (ref 26.0–34.0)
MCHC: 34.6 g/dL (ref 30.0–36.0)
MCV: 89.4 fL (ref 78.0–100.0)
RBC: 3.85 MIL/uL — ABNORMAL LOW (ref 4.22–5.81)

## 2010-04-08 LAB — LIPASE, BLOOD: Lipase: 167 U/L — ABNORMAL HIGH (ref 11–59)

## 2010-04-09 LAB — CBC
Hemoglobin: 12.4 g/dL — ABNORMAL LOW (ref 13.0–17.0)
MCH: 30.5 pg (ref 26.0–34.0)
Platelets: 188 10*3/uL (ref 150–400)
RBC: 4.06 MIL/uL — ABNORMAL LOW (ref 4.22–5.81)
WBC: 7.4 10*3/uL (ref 4.0–10.5)

## 2010-04-09 LAB — COMPREHENSIVE METABOLIC PANEL
ALT: 82 U/L — ABNORMAL HIGH (ref 0–53)
AST: 25 U/L (ref 0–37)
Albumin: 3.5 g/dL (ref 3.5–5.2)
CO2: 23 mEq/L (ref 19–32)
Chloride: 109 mEq/L (ref 96–112)
Creatinine, Ser: 0.75 mg/dL (ref 0.4–1.5)
GFR calc Af Amer: >60 mL/min (ref >60)
GFR calc non Af Amer: >60 mL/min (ref >60)
Sodium: 141 mEq/L (ref 135–145)
Total Bilirubin: 1.1 mg/dL (ref 0.3–1.2)

## 2010-04-10 LAB — CBC
MCH: 31 pg (ref 26.0–34.0)
MCV: 88.5 fL (ref 78.0–100.0)
Platelets: 181 10*3/uL (ref 150–400)
RDW: 12.4 % (ref 11.5–15.5)
WBC: 9.3 10*3/uL (ref 4.0–10.5)

## 2010-04-10 LAB — COMPREHENSIVE METABOLIC PANEL
Albumin: 3.5 g/dL (ref 3.5–5.2)
BUN: 2 mg/dL — ABNORMAL LOW (ref 6–23)
Creatinine, Ser: 0.79 mg/dL (ref 0.4–1.5)
Total Protein: 6 g/dL (ref 6.0–8.3)

## 2010-04-11 ENCOUNTER — Other Ambulatory Visit: Payer: Self-pay | Admitting: General Surgery

## 2010-04-13 NOTE — Op Note (Addendum)
[name], [name]               ACCOUNT NO.:  [id number]  MEDICAL RECORD NO.:  [id number]           PATIENT TYPE:  I  LOCATION:  4506                         FACILITY:  [facility]  PHYSICIAN:  Gabrielle Dare. Janee Morn, M.D.DATE OF BIRTH:  Apr 23, 1955  DATE OF PROCEDURE:  04/09/2010 DATE OF DISCHARGE:                              OPERATIVE REPORT   PREOPERATIVE DIAGNOSIS:  Biliary pancreatitis.  POSTOPERATIVE DIAGNOSIS:  Biliary pancreatitis.  PROCEDURE:  Laparoscopic cholecystectomy.  SURGEON:  Gabrielle Dare. Janee Morn, MD  ASSISTANT:  Currie Paris, MD  FINDINGS:  Inflamed gallbladder and we were unable to get a cholangiogram.  PROCEDURE IN DETAIL:  Informed consent was obtained.  The patient was identified in the preop holding area.  He received intravenous antibiotics.  He was brought to the operating room.  General endotracheal anesthesia was administered by the anesthesia staff.  His abdomen was prepped and draped in a sterile fashion.  We did a time-out procedure.  Infraumbilical area was infiltrated with 0.25% Marcaine with epinephrine.  Infraumbilical incision was made.  Subcutaneous tissues were dissected down revealing the anterior fascia.  This was divided sharply along the midline.  The peritoneal cavity was entered under direct vision without difficulty.  A 0 Vicryl purse-string suture was placed around the fascial opening and the Hasson trocar was inserted into the abdomen.  The abdomen was insufflated with carbon dioxide in a sterile fashion.  Under direct vision, an 5-mm epigastric and two 5-mm lateral ports were placed.  Marcaine 0.25% with epinephrine was used at the each port sites.  These were placed under direct vision. Laparoscopic exploration revealed some filmy omental adhesions to the gallbladder.  The dome of the gallbladder was retracted superomedially. The filmy omental adhesions were taken down and cautery was used to get good hemostasis.  This revealed  the infundibulum that had much of filmy adhesions over from the duodenum.  These were all swept down revealing the infundibulum well.  There were a couple areas of bleeding on the omentum from the adhesions.  We carefully cauterized those to get good hemostasis.  I then retracted the infundibulum inferolaterally. Dissection began laterally and progressed medially easily identifying the cystic duct and the cystic artery.  We continued dissecting until we had an excellent window between the cystic duct, infundibulum, and the liver.  We then placed a clip on the infundibulum-cystic duct junction. A small nick was made in the cystic duct.  We then tried to insert a Cook catheter several times to do a cholangiogram, but there was a lot of valves in the cystic duct and we were not able to get it sealed in there with clip, so we could not do cholangiogram.  Catheter was removed.  Three clips were placed proximally on the cystic duct and it was divided.  The cystic artery was then clipped twice proximally and once distally and divided.  Gallbladder was taken off the liver bed with Bovie cautery.  We did encounter a small additional blood vessel which was clipped twice proximally and divided this with cautery.  The gallbladder was taken the rest away off the liver bed  intact and placed in an Endocatch bag and removed from the infraumbilical port site.  The liver bed was copiously irrigated.  Clips were in good position. Hemostasis was assured.  The abdomen was irrigated and irrigation fluid returned clear.  Liver bed remained dry.  Ports were then removed under direct vision after evacuating the irrigation fluid.  Pneumoperitoneum was released.  The infraumbilical fascia was closed by tying the 0 Vicryl purse-string suture with care not to trap any intraabdominal contents.  All 4 wounds were copiously irrigated and skin of each was closed with running 4-0 Vicryl subcuticular stitch followed  by Dermabond.  Sponge, needle, and instrument counts were all correct.  The patient tolerated the procedure well without apparent complication and was taken to the recovery room in stable condition.     Gabrielle Dare Janee Morn, M.D.     BET/MEDQ  D:  04/09/2010  T:  04/10/2010  Job:  [id number]  cc:   Graylin Shiver, M.D.  Electronically Signed by Violeta Gelinas M.D. on 04/12/2010 04:38:05 PM Electronically Signed by Violeta Gelinas M.D. on 04/12/2010 05:19:34 PM Electronically Signed by Violeta Gelinas M.D. on 04/12/2010 07:43:20 PM

## 2010-04-14 LAB — CULTURE, BLOOD (ROUTINE X 2)
Culture  Setup Time: 201203020130
Culture  Setup Time: 201203020130
Culture: NO GROWTH

## 2010-04-14 NOTE — Progress Notes (Signed)
Summary: Rx clarification  ---- Converted from flag ---- ---- 04/06/2010 9:32 AM, Corwin Levins MD wrote: yes, this is the 24 hr tramadol kind, and he can vary the dosing from 1-2 tabs per 24 hrs  ---- 04/06/2010 9:17 AM, Margaret Pyle, CMA wrote: [facility] [address] [facility] ask MD to clarify Tramadol Rx, per phamacist Tramadol XR 100mg  tab is a once daily Rx but JWJ wrote for 1-2 two times a day as needed, please advise ------------------------------       New/Updated Medications: TRAMADOL HCL 100 MG XR24H-TAB (TRAMADOL HCL) 1 - 2 by mouth once daily as needed Prescriptions: TRAMADOL HCL 100 MG XR24H-TAB (TRAMADOL HCL) 1 - 2 by mouth once daily as needed  #60 x 2   Entered and Authorized by:   Margaret Pyle, CMA   Signed by:   Margaret Pyle, CMA on 04/06/2010   Method used:   Telephoned to ...       [facility] [address] (retail)       [address]       [place], [place]  [address]       Ph: [phone] or [phone]       Fax: [phone]   RxID:   [id number]

## 2010-04-14 NOTE — Letter (Signed)
Summary: New Patient letter  [facility]  [address] [place], [place] [address]   Phone: [phone]  Fax: [phone]       04/05/2010 MRN: [id number]  [name] [address] [place], [place]  [address]  [place]  Dear [name],  Welcome to the Gastroenterology Division at [facility].    You are scheduled to see Dr.  Melvia Heaps on May 16, 2010 at 10:00am on the 3rd floor at [facility], [address]  We ask that you try to arrive at our office 15 minutes prior to your appointment time to allow for check-in.  We would like you to complete the enclosed self-administered evaluation form prior to your visit and bring it with you on the day of your appointment.  We will review it with you.  Also, please bring a complete list of all your medications or, if you prefer, bring the medication bottles and we will list them.  Please bring your insurance card so that we may make a copy of it.  If your insurance requires a referral to see a specialist, please bring your referral form from your primary care physician.  Co-payments are due at the time of your visit and may be paid by cash, check or credit card.     Your office visit will consist of a consult with your physician (includes a physical exam), any laboratory testing he/she may order, scheduling of any necessary diagnostic testing (e.g. x-ray, ultrasound, CT-scan), and scheduling of a procedure (e.g. Endoscopy, Colonoscopy) if required.  Please allow enough time on your schedule to allow for any/all of these possibilities.    If you cannot keep your appointment, please call [phone] to cancel or reschedule prior to your appointment date.  This allows [place] the opportunity to schedule an appointment for another patient in need of care.  If you do not cancel or reschedule by 5 p.m. the business day prior to your appointment date, you will be charged a $50.00 late cancellation/no-show fee.    Thank you  for choosing [facility] for your medical needs.  We appreciate the opportunity to care for you.  Please visit [place] at our website  to learn more about our practice.                     Sincerely,                                                             The Gastroenterology Division

## 2010-04-14 NOTE — Assessment & Plan Note (Signed)
Summary: indigestion/cant eat or drink without getting worse/plot/lb   Vital Signs:  Patient profile:   55 year old male Height:      67 inches Weight:      159 pounds BMI:     24.99 O2 Sat:      97 % on Room air Temp:     98.5 degrees F oral Pulse rate:   80 / minute BP sitting:   120 / 78  (left arm) Cuff size:   regular  Vitals Entered By: Zella Ball Ewing CMA (AAMA) (April 05, 2010 11:20 AM)  O2 Flow:  Room air CC: Stomach pain, headache/RE   Primary Care Provider:  Tresa Garter MD  CC:  Stomach pain and headache/RE.  History of Present Illness: here to f/u - acute visit with abd pain;  seeemed to start with a sip or ice tea at the restrauant for lunch on sun Feb 26th;  took food home  b/c pain started and would not start;  took pepci (and more ice tea) when got home , which seemed to exac the pain worse;  pain finally seemed to improve, but still constant, persistent, mild to mod, epigastric, with some nausea with the worst pan, but o/w no n/v, no radiation,  no constipation or bowel change, no blood.  No fever per se and not felt warm or chill, no hot and cold.  Also with onset severe HA starteed about 9AM feb 27th, frontal and back of the head started at same time, severe, nonthrobbing, but constant,  no photophobia, no blurred vision, has stable dizziness with MS but now worse, no ENT issue o/w such as St, earache, ST or cough. Pt denies CP, worsening sob, doe, wheezing, orthopnea, pnd, worsening LE edema, palps, dizziness or syncope  Pt denies new neuro symptoms such as facial or extremity weakness   Pt denies polydipsia, polyuria,  Overall good compliance with meds, trying to follow low chol diet, wt stable, little excercise however . Last  MRI for head approx april 2012 , as he has MRI yearly per [facility] neurology.  Has chronic left sided symtpoms with the MS, - tingling and cold on the left, but recently also on the right (new for him).  No bowel change, gait change, or  falls or injury.  No fever, wt loss, night sweats, loss of appetite (though low to start with) or other constitutional symptoms .    Preventive Screening-Counseling & Management      Drug Use:  no.    Problems Prior to Update: 1)  Headache  (ICD-784.0) 2)  Abdominal Pain  (ICD-789.00) 3)  Abnormality of Gait  (ICD-781.2) 4)  Insomnia, Chronic  (ICD-307.42) 5)  Low Back Pain  (ICD-724.2) 6)  Vitamin D Deficiency  (ICD-268.9) 7)  Seborrheic Dermatitis  (ICD-690.10) 8)  Physical Examination  (ICD-V70.0) 9)  Asthma Nos W/acute Exacerbation  (ICD-493.92) 10)  Upper Respiratory Infection, Acute  (ICD-465.9) 11)  Cough  (ICD-786.2) 12)  Multiple Sclerosis  (ICD-340)  Medications Prior to Update: 1)  Copaxone 20 Mg/ml Kit (Glatiramer Acetate) .... Take 1 Injection Q Qhs 2)  Multivitamins  Tabs (Multiple Vitamin) .... Take 1 By Mouth Qd 3)  Zyrtec Allergy 10 Mg Tabs (Cetirizine Hcl) .[name].. 1 By Mouth Daily As Needed For Allergies 4)  Vitamin D 1000 Unit Tabs (Cholecalciferol) .... 2 By Mouth Once Daily 5)  Tramadol Hcl 50 Mg Tabs (Tramadol Hcl) .[name].. 1-2 Tabs By Mouth Two Times A Day As Needed Pain 6)  Prednisone 10 Mg Tabs (Prednisone) .[name].. 1 Once Daily 7)  Metanx 3-35-2 Mg Tabs (L-Methylfolate-B6-B12) .... 2 By Mouth Qd  Current Medications (verified): 1)  Copaxone 20 Mg/ml Kit (Glatiramer Acetate) .... Take 1 Injection Q Qhs 2)  Multivitamins  Tabs (Multiple Vitamin) .... Take 1 By Mouth Qd 3)  Zyrtec Allergy 10 Mg Tabs (Cetirizine Hcl) .[name].. 1 By Mouth Daily As Needed For Allergies 4)  Vitamin D 1000 Unit Tabs (Cholecalciferol) .... 2 By Mouth Once Daily 5)  Tramadol Hcl 100 Mg Xr24h-Tab (Tramadol Hcl) .[name].. 1 - 2 By Mouth Two Times A Day As Needed 6)  Prednisone 10 Mg Tabs (Prednisone) .[name].. 1 Once Daily 7)  Metanx 3-35-2 Mg Tabs (L-Methylfolate-B6-B12) .... 2 By Mouth Qd 8)  Nexium 40 Mg Cpdr (Esomeprazole Magnesium) .[name].. 1po Once Daily  Allergies (verified): No Known Drug  Allergies  Past History:  Past Surgical History: Last updated: 04/19/2009 Tonsillectomy  Family History: Last updated: 04/13/2009 No CAD  Social History: Last updated: 06/14/2009 Marital Status: Married Occupation: Artist, retired Hotel manager since 1995 Never Smoked Alcohol use-no Drug use-no Regular exercise-yes Able to drive  Risk Factors: Alcohol Use: 0 (05/03/2009) Exercise: yes (04/19/2009)  Risk Factors: Smoking Status: never (05/03/2009)  Past Medical History: MS - dx 6/09 - Dr. Dohmeirr/DrPharr - [facility] Dr Ranell Patrick [place] Vit D def Low back pain  Review of Systems  The patient denies anorexia, fever, vision loss, decreased hearing, hoarseness, chest pain, syncope, dyspnea on exertion, peripheral edema, prolonged cough, hemoptysis, melena, hematochezia, severe indigestion/heartburn, hematuria, muscle weakness, suspicious skin lesions, transient blindness, difficulty walking, depression, unusual weight change, abnormal bleeding, enlarged lymph nodes, and angioedema.         all otherwise negative per pt -  due for vit b12 and D with next labs per Dr Posey Rea  Physical Exam  General:  alert and underweight appearing.   Head:  normocephalic and atraumatic.   Eyes:  vision grossly intact, pupils equal, and pupils round.   Ears:  R ear normal and L ear normal.   Nose:  no external deformity and no nasal discharge.   Mouth:  no gingival abnormalities and pharynx pink and moist.   Neck:  supple and no masses.   Lungs:  normal respiratory effort and normal breath sounds.   Heart:  normal rate and regular rhythm.   Abdomen:  soft and normal bowel sounds.with tender to epigstric area and somewhat RUQ as well;  no guarding or rebound, and no HSM Msk:  no joint tenderness and no joint swelling.   Extremities:  no edema, no erythema  Neurologic:  No cranial nerve deficits noted. He is ataxic. LE weak B. - no sigtnficant change today Skin:  color normal and  no rashes.   Psych:  not depressed appearing and slightly anxious.     Impression & Recommendations:  Problem # 1:  ABDOMINAL PAIN (ICD-789.00) unclear etiology, but new complaint, not clearly simple reflux, cant r/o gastritis, GB or PUD or other;  for labs ot include the h pylori, refer GI, abd u/s  Orders: TLB-BMP (Basic Metabolic Panel-BMET) (80048-METABOL) TLB-CBC Platelet - w/Differential (85025-CBCD) TLB-Hepatic/Liver Function Pnl (80076-HEPATIC) TLB-Lipase (83690-LIPASE) TLB-Udip w/ Micro (81001-URINE) TLB-H. Pylori Abs(Helicobacter Pylori) (86677-HELICO) Gastroenterology Referral (GI) Radiology Referral (Radiology)  Problem # 2:  HEADACHE (ICD-784.0)  His updated medication list for this problem includes:    Tramadol Hcl 100 Mg Xr24h-tab (Tramadol hcl) .[name]... 1 - 2 by mouth two times a day as needed etiology unclear,  gave toradol IM today, and to adjust the tramadol to the ER form as above,  to conisder MRI but he declines as is due for next in April in per neuro at [facility]  Orders: Ketorolac-Toradol 15mg  [phone]) Admin of Therapeutic Inj  intramuscular or subcutaneous (57846)  Problem # 3:  VITAMIN D DEFICIENCY (ICD-268.9) to check labs with next draw, has appt with Dr Posey Rea approx one wk Orders: T-Vitamin D (25-Hydroxy) (96295-28413)  Problem # 4:  ABNORMALITY OF GAIT (ICD-781.2) no change, due to MS, tx and eval as above, also check B12 Orders: TLB-B12 + Folate Pnl ([id number])  Complete Medication List: 1)  Copaxone 20 Mg/ml Kit (Glatiramer acetate) .... Take 1 injection q qhs 2)  Multivitamins Tabs (Multiple vitamin) .... Take 1 by mouth qd 3)  Zyrtec Allergy 10 Mg Tabs (Cetirizine hcl) .[name].. 1 by mouth daily as needed for allergies 4)  Vitamin D 1000 Unit Tabs (Cholecalciferol) .... 2 by mouth once daily 5)  Tramadol Hcl 100 Mg Xr24h-tab (Tramadol hcl) .[name].. 1 - 2 by mouth two times a day as needed 6)  Prednisone 10 Mg Tabs (Prednisone) .[name].. 1 once  daily 7)  Metanx 3-35-2 Mg Tabs (L-methylfolate-b6-b12) .... 2 by mouth qd 8)  Nexium 40 Mg Cpdr (Esomeprazole magnesium) .[name].. 1po once daily  Patient Instructions: 1)  you had the pain shot today in the office (toradol) 2)  Please go to the Lab in the basement for your blood and/or urine tests today 3)  You will be contacted about the referral(s) to: abdomen ultrasound, and GI referral 4)  Please call the number on the [facility] Card for results of your testing  5)  please re-start the nexium 40 mg per day 6)  you can also take the generic for tramadol (the 24 hr form) as needed for pain 7)  Continue all previous medications as before this visit  8)  Please keep your appt with neurology in April as planned, and Dr Posey Rea next wk as planned as well  ( no need for other blood work next wk) Prescriptions: TRAMADOL HCL 100 MG XR24H-TAB (TRAMADOL HCL) 1 - 2 by mouth two times a day as needed  #60 x 2   Entered and Authorized by:   Corwin Levins MD   Signed by:   Corwin Levins MD on 04/05/2010   Method used:   Print then Give to Patient   RxID:   [id number] NEXIUM 40 MG CPDR (ESOMEPRAZOLE MAGNESIUM) 1po once daily  #30 x 11   Entered and Authorized by:   Corwin Levins MD   Signed by:   Corwin Levins MD on 04/05/2010   Method used:   Print then Give to Patient   RxID:   [id number]    Medication Administration  Injection # 1:    Medication: Ketorolac-Toradol 15mg     Diagnosis: HEADACHE (ICD-784.0)    Route: IM    Site: LUOQ gluteus    Exp Date: 02/2011    Lot #: [id number]    Mfr: Novaplus    Comments: Patient received Ketorolac-Toradol 30mg     Patient tolerated injection without complications    Given by: Zella Ball Ewing CMA Duncan Dull) (April 05, 2010 12:58 PM)  Orders Added: 1)  TLB-B12 + Folate Pnl [82746_82607-B12/FOL] 2)  T-Vitamin D (25-Hydroxy) [41660-63016] 3)  TLB-BMP (Basic Metabolic Panel-BMET) [80048-METABOL] 4)  TLB-CBC Platelet - w/Differential [85025-CBCD] 5)   TLB-Hepatic/Liver Function Pnl [80076-HEPATIC] 6)  TLB-Lipase [83690-LIPASE] 7)  TLB-Udip w/ Micro [81001-URINE]  8)  TLB-H. Pylori Abs(Helicobacter Pylori) [86677-HELICO] 9)  Ketorolac-Toradol 15mg  [J1885] 10)  Admin of Therapeutic Inj  intramuscular or subcutaneous [96372] 11)  Gastroenterology Referral [GI] 12)  Radiology Referral [Radiology] 13)  Est. Patient Level V [16109]

## 2010-04-15 ENCOUNTER — Encounter (INDEPENDENT_AMBULATORY_CARE_PROVIDER_SITE_OTHER): Payer: Managed Care, Other (non HMO) | Admitting: Internal Medicine

## 2010-04-15 ENCOUNTER — Encounter: Payer: Self-pay | Admitting: Internal Medicine

## 2010-04-15 DIAGNOSIS — R197 Diarrhea, unspecified: Secondary | ICD-10-CM

## 2010-04-15 DIAGNOSIS — K869 Disease of pancreas, unspecified: Secondary | ICD-10-CM | POA: Insufficient documentation

## 2010-04-15 DIAGNOSIS — R350 Frequency of micturition: Secondary | ICD-10-CM

## 2010-04-17 NOTE — Discharge Summary (Signed)
[name], [name]               ACCOUNT NO.:  [id number]  MEDICAL RECORD NO.:  [id number]           PATIENT TYPE:  I  LOCATION:  4506                         FACILITY:  [facility]  PHYSICIAN:  [name], M.D.  DATE OF BIRTH:  1955/03/07  DATE OF ADMISSION:  04/07/2010 DATE OF DISCHARGE:  04/10/2010                              DISCHARGE SUMMARY   PRIMARY MD:  Herbert Quint. Plotnikov, MD  PRIMARY NEUROLOGIST:  At [facility], MD  PRIMARY NEUROLOGIST:  At [facility], [name].  DISCHARGE DIAGNOSES: 1. Acute gallstone pancreatitis. 2. Cholelithiasis/choledocholithiasis, status post laparoscopic     cholecystectomy, April 09, 2010. 3. Multiple sclerosis.  DISCHARGE MEDICATIONS: 1. Flonase nasal spray, one spray each nostril p.r.n. daily for nasal     congestion. 2. Oxycodone/APAP 5/325 one to two tablets p.o. p.r.n. q.4 hourly for     pain, a total of 20 pills have been dispensed. 3. Protonix 40 mg p.o. daily for 7 days. 4. Baclofen 10 mg p.o. t.i.d. 5. Gas-X OTC 1 capsule p.o. p.r.n. daily for gas. 6. Pepcid OTC 1 tablet p.o. p.r.n. q. evening for heartburn. 7. Tramadol 50 mg 1-2 tablets p.o. p.r.n. b.i.d. for pain. 8. Vitamin D 1000 units p.o. b.i.d.  PROCEDURES: 1. Abdominal ultrasound scan on April 07, 2010.  This showed biliary     sludge with probable multiple small stones.  The patient is tender     over gallbladder but there are no other sonographic changes to     suggest acute cholecystitis.  Pancreas is obscured.  The common     bile duct is mildly dilated without demonstration of intraluminal     stones.  No other acute or significant findings.  CONSULTATIONS: 1. Herbert Boop, MD, [facility], gastroenterologist. 2. Herbert Mould. Derrell Lolling, MD, general surgeon. 3. Herbert Dare. Janee Morn, MD, general surgeon.  ADMISSION HISTORY:  As in H and P notes of April 07, 2010, dictated by this MD; however, in brief this is a 55 year old male with known history of  primary progressive multiple sclerosis diagnosed in 1999.  He has synovitis right index and long finger extensors, requiring surgery on February 18, 2010, status post right inguinal hernia repair with mesh in April 2002, asymptomatic left inguinal hernia, status post arthroscopic right knee surgery in 1995, status post nasal septal surgery for deviation in 1988, presenting with an approximately a 5-day history of abdominal pain on and off.  He was seen by his primary MD on April 05, 2010, for continued symptoms, evaluated by [name].  He was scheduled for abdominal ultrasound scan on April 08, 2010; however, because of escalating symptoms he presented to the emergency department on April 07, 2010, and was admitted for further evaluation, investigation, and management.  CLINICAL COURSE: 1. Acute gallstone pancreatitis.  Review of the patient's medical     records demonstrated that LFTs as of April 05, 2010, showed     alkaline phosphatase of 64, AST 15, lipase of 37.  However, the     same labs on April 07, 2010, showed alkaline phosphatase of 93, AST     227, ALT  182, lipase of 2000.  It was felt that the patient had     intermittent obstructing choledocholithiasis, resulting in acute     gallstone pancreatitis.  Abdominal ultrasound scan confirmed     multiple small gallstones and mild common bile duct dilatation.     The patient was placed on bowel rest, intravenous fluid hydration,     parenteral proton pump inhibitors, analgesics, and empiric Zosyn.     GI consultation was kindly provided by [name], who     discontinued the Zosyn as there appeared to be no active evidence     of infection, but felt the patient may indeed have passed a     common bile duct stone and did not recommend ERCP.  Alternatively,     he did recommend surgical consultation.  Over the next couple of     days of hospitalization, the patient's acute pancreatitis speedy     resolved with steady  diminution of lipase levels.  As a matter of     fact, there was a dramatic drop in lipase to 167 on April 08, 2010,     and 65 on April 09, 2010.  By April 10, 2010, lipase level had     normalized completely at 39, and the patient was able to tolerate a     regular diet.  2. Cholelithiasis.  As described above, surgical consultation was     requested, which was kindly provided by [name].  As the     patient did indeed have symptomatic cholecystitis, laparoscopic     cholecystectomy was recommended.  This was carried out on April 09, 2010, in an uncomplicated procedure.  By April 10, 2010, the patient     had return of bowel function, was able to tolerate a regular diet,     was ambulating, did not exhibit acute pain.  He was therefore     cleared by surgeons for discharge.  3. Multiple sclerosis.  The patient has primary progressive multiple     sclerosis and is under the care of [name] at [facility], as well as [name] at [facility].  There were no problems referable to this, during the     course of his hospitalization.  DISPOSITION:  The patient was on April 10, 2010, cleared by surgeons for discharge, and was therefore discharged accordingly.  He did have some nasal congestion during this hospitalization for which he has been prescribed p.r.n. Flonase nasal spray.  ACTIVITY:  As tolerated.  Recommended to increase activity slowly.  DIET:  Low-fat diet.  SPECIAL INSTRUCTIONS:  The patient is recommended to return to regular duties on April 24, 2010, FMLA form has been completed.  FOLLOWUP INSTRUCTIONS:  The patient is to follow up with his primary MD, [name], in coming week and has been instructed to call for an appointment.  He is to follow up with [name], surgeon, in 2-3 weeks, telephone number [phone].  He is instructed to call for an appointment and has verbalized  understanding.  In addition, he is follow up with his primary neurologist, [name], and [name] at prior scheduled appointment.     [name], M.D.     CO/MEDQ  D:  04/10/2010  T:  04/11/2010  Job:  [id number]  cc:   Herbert Quint. Plotnikov, MD Melvyn Novas, M.D. Laurell Josephs  E. Janee [name], M.D. [name].  Electronically Signed by [name] M.D. on 04/17/2010 09:33:55 AM

## 2010-04-17 NOTE — H&P (Addendum)
[name], [name]               ACCOUNT NO.:  [id number]  MEDICAL RECORD NO.:  [id number]           PATIENT TYPE:  E  LOCATION:  [facility]                         FACILITY:  [facility]  PHYSICIAN:  Isidor Holts, M.D.  DATE OF BIRTH:  1955-11-22  DATE OF ADMISSION:  04/07/2010 DATE OF DISCHARGE:                             HISTORY & PHYSICAL   PRIMARY PHYSICIAN:  [name], MD  PRIMARY NEUROLOGIST:  Melvyn Novas, MD, at [facility].  Primary neurologist at [facility], Dr. [name].  CHIEF COMPLAINT:  Upper abdominal pain on and off for approximately 5 days, now much worse, also nausea.  HISTORY OF PRESENT ILLNESS:  This is a 55 year old male. For past medical history, see below.  The patient is an excellent historian and history was supplemented by the patient's spouse, who was present at the emergency department.  According to them, the patient had been in his usual state of health until about 1 p.m. on April 03, 2010, while at [employer].  He had a sip of ice tea, and minutes, afterwards he developed sudden onset of upper abdominal pain, which persisted for approximately 45 minutes, without any radiation.  He got home, took 2 Pepcid, and then another sip of ice tea.  This resulted in relapse of the pain, which is continued for approximately 45 minutes, then subsided, with residual fullness.  On March 28, 2010, because of continuing upper abdominal soreness, he went to his primary MD's office, saw Dr. [name] who was covering for the primary MD in the office, and there he was evaluated and given a prescription for Nexium and Ultracet, which he never filled.  Blood work was done and he was scheduled for abdominal ultrasound scan on April 08, 2010.  He managed to go to work on April 02, 2010.  In the evening, he ate the dinner of burger, fries, and ice tea and then as soon as he got to bed at about 11 p.m., the pain recurred i.e., upper abdominal,  with no radiation.  He took Pepcid twice, this was unhelpful.  Pain continued to escalate.  He woke his wife at about 1:13 a.m. on April 07, 2010.  The wife gave him some Pepcid, which did not seem to help.  However, he finally fell asleep at about 4 a.m. on April 07, 2010, although, he still had pain, but this was at least bearable.  He subsequently woke up in the a.m. of April 07, 2010, with continued severe pain and came to the emergency department.  He has had occasional nausea, but no vomiting.  No diarrhea.  No fever or chills. No chest pain or shortness of breath.  PAST MEDICAL HISTORY: 1. Multiple sclerosis diagnosed in 1999, described as primary     progressive. 2. Tenosynovitis of the right index and long finger extensors with his     hand surgery, February 18, 2010. 3. Status post right inguinal hernia repair with mesh, April 2002. 4. Left inguinal hernia, asymptomatic. 5. Status post right knee arthroscopic surgery, 1995. 6. Status post nasoseptal surgery for deviation, 1988.  ALLERGIES:  NO KNOWN DRUG ALLERGIES. MEDICATION  HISTORY: 1. Gas-X OTC 1 capsule p.r.n. daily for "gas." 2. Pepcid OTC 1 tablet p.o. p.r.n. every evening for heartburn. 3. Vitamin D 1000 units p.o. b.i.d. 4. Tramadol 50 mg 1-2 tablets p.o. b.i.d. p.r.n. for pain. 5. Baclofen 10 mg p.o. t.i.d.  REVIEW OF SYSTEMS:  As per history and chief complaint, otherwise negative.  SOCIAL HISTORY:  The patient used to be in [employer], but retired in January 06, 1994.  He is married, has 2 offspring.  Nonsmoker and nondrinker.  Has no history of drug abuse.  FAMILY HISTORY:  The patient's father died at age 53 years.  He had lung cancer, but was otherwise healthy and had quit smoking years prior to that.  The patient's mother is still living.  She has diabetes mellitus and is hypertensive.  PHYSICAL EXAMINATION:  VITAL SIGNS:  Temperature 98.1, pulse 74 per minute regular, respiratory rate 16, BP 146/87  mmHg, and pulse oximetry 97% on room air. GENERAL:  The patient did not appear to be in obvious acute distress at the time of this evaluation following intravenous analgesics administered in the emergency department.  Alert, communicative, and not short of breath at rest. HEENT:  No clinical pallor or jaundice.  No conjunctival injection. Hydration status seems fair. NECK:  Supple.  Throat is clear.  JVP not seen.  No palpable lymphadenopathy.  No palpable goiter. CHEST:  Clear to auscultation.  No wheezes.  No crackles. HEART:  S1 and S2 heard normal, regular.  No murmurs. ABDOMEN:  Flat and soft.  The patient has epigastric tenderness to palpation and also mild right upper quadrant tenderness.  No guarding. However, no rebound.  Bowel sounds are heard.  No palpable organomegaly. EXTREMITIES:  Lower extremity examination showed minimal edema noted bilaterally. MUSCULOSKELETAL:  Quite unremarkable. CENTRAL NERVOUS SYSTEM:  No focal neurologic deficit on gross examination.  INVESTIGATIONS:  CBC; WBC 7.9, hemoglobin 13.6, hematocrit 38.7, and platelets 181.  Electrolytes; sodium 142, potassium 4.0, chloride 107, CO2 of 27, BUN 5, and creatinine 0.82.  Glucose 112.  Alkaline phosphatase 93, AST 227, ALT 182, and lipase over 2000.  Urinalysis is negative.  Abdominal ultrasound scan on April 27, 2010, shows biliary sludge with multiple gallstones.  However, no sonographic changes of acute cholecystitis.  There was mildly dilated common bile duct without demonstration of intraluminal stones.  Pancreas is obscured by overlying gas.  No focal liver lesion is identifying.  Common bile duct measures 8.7 mm.  ASSESSMENT AND PLAN: 1. Acute gallstone pancreatitis.  A review of the patient's electronic     medical records demonstrate that the patient's LFTs as of April 05, 2010, showed an alkaline phosphatase of 64, AST 15, and lipase     at that time was normal at 37.0.  It appears  likely the patient did     have intermittent obstructed choledocholithiasis, associated with     biliary colic, at that time of initial symptomatology on     April 03, 2010.  However, his repeat LFTs of April 03, 2010,     shows alkaline phosphatase of 93, AST 227, ALT 182 with a lipase of     over 2000, consistent with common bile duct obstruction and     gallstone pancreatitis.  This is substantiated by imaging studies     demonstrating dilated common bile duct as well as cholelithiasis.     We shall place the patient on bowel rest, administer with  intravenous fluid hydration, utilize proton pump inhibitor,     analgesics, and antiemetics.  Follow lipase levels and arrange     abdominal/pelvic CT scan to elucidate anatomy.  2. Cholelithiasis/choledocholithiasis.  Refer to #1 above.  We     shall request GI consultation for possible ERCP and also surgical     consultation for cholecystectomy, due to presence of symptomatic     gallstones.  3. History of multiple sclerosis.  This is primary progressive and     appears to be stable at the present time.    Note:  We shall place the patient on empiric broad-spectrum antibiotic coverage with Zosyn     for now, although there is no clinical evidence of infection.  White cell count appears to be     normal.  He is apyrexial, however, should he spike a temperature,     we shall do blood cultures.    Further management will depend on clinical course.     Isidor Holts, M.D.     CO/MEDQ  D:  04/07/2010  T:  04/07/2010  Job:  [id number]  cc:   [name], MD Melvyn Novas, M.D. Drue Flirt, M.D.  Electronically Signed by Isidor Holts M.D. on 05/04/2010 12:52:40 PM

## 2010-04-19 NOTE — Assessment & Plan Note (Signed)
Summary: PHYSICAL / LB /NWS   Vital Signs:  Patient profile:   55 year old male Height:      67 inches Weight:      155 pounds BMI:     24.36 Temp:     98.6 degrees F oral Pulse rate:   76 / minute Pulse rhythm:   regular Resp:     16 per minute BP sitting:   130 / 80  (left arm) Cuff size:   regular  Vitals Entered By: Lanier Prude, Beverly Gust) (April 15, 2010 8:38 AM) CC: f/u Is Patient Diabetic? No Comments pt  had cholecystectomy 1 wk ago. He is not taking Copaxone, Prednisone or Metanex   Primary Care Provider:  Tresa Garter MD  CC:  f/u.  History of Present Illness: The patient presents for a post-hospital visit for   DISCHARGE DIAGNOSES: 1. Acute gallstone pancreatitis. 2. Cholelithiasis/choledocholithiasis status post laparoscopic     cholecystectomy, April 09, 2010. 3. Multiple sclerosis.  DISCHARGE MEDICATIONS: 1. Flonase nasal spray, one spray each nostril p.r.n. daily for nasal     congestion. 2. Oxycodone/APAP 5/325 one to two tablets p.o. p.r.n. q.4 hourly for     pain, a total of 20 pills have been dispensed. 3. Protonix 40 mg p.o. daily for 7 days. 4. Baclofen 10 mg p.o. t.i.d. 5. Gas-X OTC 1 capsule p.o. p.r.n. daily for gas. 6. Pepcid OTC 1 tablet p.o. p.r.n. q. evening for heartburn. 7. Tramadol 50 mg 1-2 tablets p.o. p.r.n. b.i.d. for pain. 8. Vitamin D 1000 units p.o. b.i.d.   C/o urinary frequency x  1 month  Current Medications (verified): 1)  Copaxone 20 Mg/ml Kit (Glatiramer Acetate) .... Take 1 Injection Q Qhs 2)  Multivitamins  Tabs (Multiple Vitamin) .... Take 1 By Mouth Qd 3)  Zyrtec Allergy 10 Mg Tabs (Cetirizine Hcl) .[name].. 1 By Mouth Daily As Needed For Allergies 4)  Vitamin D 1000 Unit Tabs (Cholecalciferol) .... 2 By Mouth Once Daily 5)  Tramadol Hcl 100 Mg Xr24h-Tab (Tramadol Hcl) .[name].. 1 - 2 By Mouth Once Daily As Needed 6)  Prednisone 10 Mg Tabs (Prednisone) .[name].. 1 Once Daily 7)  Metanx 3-35-2 Mg Tabs  (L-Methylfolate-B6-B12) .... 2 By Mouth Qd 8)  Nexium 40 Mg Cpdr (Esomeprazole Magnesium) .[name].. 1po Once Daily 9)  Flonase 50 Mcg/act Susp (Fluticasone Propionate) .[name].. 1 Spray in Each Nostril Daily. 10)  Protonix 40 Mg Tbec (Pantoprazole Sodium) .[name].. 1 By Mouth Once Daily 11)  Baclofen 10 Mg Tabs (Baclofen) .[name].. 1 By Mouth Three Times A Day  Allergies (verified): No Known Drug Allergies  Past History:  Social History: Last updated: 06/14/2009 Marital Status: Married Occupation: Artist, retired Hotel manager since 1995 Never Smoked Alcohol use-no Drug use-no Regular exercise-yes Able to drive  Past Medical History: Reviewed history from 04/05/2010 and no changes required. MS - dx 6/09 - Dr. Dohmeirr/DrPharr - [facility] Dr Ranell Patrick SD Vit D def Low back pain  Past Surgical History: Tonsillectomy Cholecystectomy 2012  Physical Exam  General:  alert and not underweight appearing.   Head:  normocephalic and atraumatic.   Eyes:  vision grossly intact, pupils equal, and pupils round.   Ears:  R ear normal and L ear normal.   Mouth:  no gingival abnormalities and pharynx pink and moist.   Neck:  supple and no masses.   Lungs:  normal respiratory effort and normal breath sounds.   Heart:  normal rate and regular rhythm.   Abdomen:  soft and normal bowel sounds.NT. scars are healingno masses, no hepatomegaly, and no splenomegaly.   Msk:  no joint tenderness and no joint swelling.   Neurologic:  No cranial nerve deficits noted. He is ataxic. LE weak B Skin:  SD on face   Psych:  not depressed appearing and slightly anxious.     Impression & Recommendations:  Problem # 1:  GALLSTONE PANCREATITIS (ICD-577.9) Assessment New We reviewed his recent events and labs with prcedures  Problem # 2:  MULTIPLE SCLEROSIS (ICD-340) Assessment: Unchanged On the regimen of medicine(s) reflected in the chart    Problem # 3:  LOW BACK PAIN (ICD-724.2) Assessment:  Unchanged  His updated medication list for this problem includes:    Tramadol Hcl 100 Mg Xr24h-tab (Tramadol hcl) .[name]... 1 - 2 by mouth once daily as needed    Baclofen 10 Mg Tabs (Baclofen) .[name]... 1 by mouth three times a day  Problem # 4:  VITAMIN D DEFICIENCY (ICD-268.9) Assessment: Improved On the regimen of medicine(s) reflected in the chart    Problem # 5:  FREQUENCY, URINARY (ICD-788.41) Assessment: New  His updated medication list for this problem includes:    Rapaflo 8 Mg Caps (Silodosin) .[name]... 1 by mouth once daily for bladder  Problem # 6:  DIARRHEA, ACUTE (ICD-787.91) post - surg most likely Assessment: New  Complete Medication List: 1)  Copaxone 20 Mg/ml Kit (Glatiramer acetate) .... Take 1 injection q qhs 2)  Multivitamins Tabs (Multiple vitamin) .... Take 1 by mouth qd 3)  Zyrtec Allergy 10 Mg Tabs (Cetirizine hcl) .[name].. 1 by mouth daily as needed for allergies 4)  Vitamin D 1000 Unit Tabs (Cholecalciferol) .... 2 by mouth once daily 5)  Tramadol Hcl 100 Mg Xr24h-tab (Tramadol hcl) .[name].. 1 - 2 by mouth once daily as needed 6)  Prednisone 10 Mg Tabs (Prednisone) .[name].. 1 once daily 7)  Metanx 3-35-2 Mg Tabs (L-methylfolate-b6-b12) .... 2 by mouth qd 8)  Nexium 40 Mg Cpdr (Esomeprazole magnesium) .[name].. 1po once daily 9)  Flonase 50 Mcg/act Susp (Fluticasone propionate) .[name].. 1 spray in each nostril daily. 10)  Protonix 40 Mg Tbec (Pantoprazole sodium) .[name].. 1 by mouth once daily 11)  Baclofen 10 Mg Tabs (Baclofen) .[name].. 1 by mouth three times a day 12)  Rapaflo 8 Mg Caps (Silodosin) .[name].. 1 by mouth once daily for bladder  Patient Instructions: 1)  Please schedule a follow-up appointment in 3 months well w/labs. Prescriptions: PROTONIX 40 MG TBEC (PANTOPRAZOLE SODIUM) 1 by mouth once daily  #30 x 11   Entered and Authorized by:   Tresa Garter MD   Signed by:   Tresa Garter MD on 04/15/2010   Method used:   Print then Give to Patient   RxID:    [id number] RAPAFLO 8 MG CAPS (SILODOSIN) 1 by mouth once daily for bladder  #30 x 12   Entered and Authorized by:   Tresa Garter MD   Signed by:   Tresa Garter MD on 04/15/2010   Method used:   Print then Give to Patient   RxID:   [id number]    Orders Added: 1)  Est. Patient Level IV [84696]

## 2010-04-20 ENCOUNTER — Emergency Department (HOSPITAL_COMMUNITY)
Admission: EM | Admit: 2010-04-20 | Discharge: 2010-04-20 | Disposition: A | Discharge status: Other Acute Inpatient Hospital | Payer: Managed Care, Other (non HMO) | Source: Home / Self Care | Attending: Emergency Medicine | Admitting: Emergency Medicine

## 2010-04-20 ENCOUNTER — Inpatient Hospital Stay (HOSPITAL_COMMUNITY)
Admission: AD | Admit: 2010-04-20 | Discharge: 2010-04-24 | DRG: 439 | Disposition: A | Discharge status: Home / Self Care | Payer: Managed Care, Other (non HMO) | Source: Ambulatory Visit | Attending: Surgery | Admitting: Surgery

## 2010-04-20 DIAGNOSIS — Y836 Removal of other organ (partial) (total) as the cause of abnormal reaction of the patient, or of later complication, without mention of misadventure at the time of the procedure: Secondary | ICD-10-CM | POA: Diagnosis present

## 2010-04-20 DIAGNOSIS — G35 Multiple sclerosis: Secondary | ICD-10-CM | POA: Diagnosis present

## 2010-04-20 DIAGNOSIS — Y92009 Unspecified place in unspecified non-institutional (private) residence as the place of occurrence of the external cause: Secondary | ICD-10-CM

## 2010-04-20 DIAGNOSIS — K8050 Calculus of bile duct without cholangitis or cholecystitis without obstruction: Secondary | ICD-10-CM | POA: Diagnosis present

## 2010-04-20 DIAGNOSIS — K9186 Retained cholelithiasis following cholecystectomy: Secondary | ICD-10-CM | POA: Diagnosis present

## 2010-04-20 DIAGNOSIS — R1011 Right upper quadrant pain: Secondary | ICD-10-CM

## 2010-04-20 DIAGNOSIS — Z8249 Family history of ischemic heart disease and other diseases of the circulatory system: Secondary | ICD-10-CM

## 2010-04-20 DIAGNOSIS — J45909 Unspecified asthma, uncomplicated: Secondary | ICD-10-CM | POA: Diagnosis present

## 2010-04-20 DIAGNOSIS — K859 Acute pancreatitis without necrosis or infection, unspecified: Principal | ICD-10-CM | POA: Diagnosis present

## 2010-04-20 NOTE — Consult Note (Signed)
[name], [place]               ACCOUNT NO.:  [id number]  MEDICAL RECORD NO.:  [id number]           PATIENT TYPE:  I  LOCATION:  4506                         FACILITY:  [facility]  PHYSICIAN:  Angelia Mould. Derrell Lolling, M.D.DATE OF BIRTH:  02/28/1955  DATE OF CONSULTATION:  04/07/2010 DATE OF DISCHARGE:                                CONSULTATION   REFERRING PHYSICIAN:  Isidor Holts, MD  CONSULTING SURGEON:  Angelia Mould. Derrell Lolling, MD  REASON FOR CONSULTATION:  Biliary pancreatitis.  HISTORY OF PRESENT ILLNESS:  Mr. [name] is a pleasant 55 year old gentleman with history of multiple sclerosis, who states he started having midepigastric abdominal discomfort about midday Sunday this past weekend.  He states that the pain stopped him in his tracks just he was about to sit down for lunch, so much so that he had to go home and postpone eating.  He states this pain lasted about 45 minutes to an hour, tried to take a little bit of over-the-counter Pepcid without much relief.  This subsequently eventually faded or became very mild and dull pain and was very tolerable.  He had an additional episode Tuesday evening that lasted well into the night but again gradually subsided to a tolerable level and again last night.  However, at this time, his wife recommended and forced him to come in to the emergency department.  In the meantime, he had seen his primary care physician who had ordered some outpatient labs which were apparently okay, and also ordered an ultrasound which the patient had not yet done.  However, upon coming to the emergency department today, he was having repeat labs and found to have an elevated lipase and subsequently had an ultrasound ordered, showing evidence of gallstones and sludge with a slightly dilated common bile duct.  The patient denies any fever or chills.  He denies any nausea or vomiting, but has had diminished appetite.  He denies any diarrhea or changes in his bowel  habits, having normal-colored stools.  He denies any chest pain or shortness of breath.  He denies any dysuria or hematuria.  He denies any significant prior abdominal pain like this in the past.  He denies any recent travel history, abnormal ingestions, or trauma to his abdomen.  PAST MEDICAL HISTORY:  As noted, the patient has primary progressive multiple sclerosis.  PAST SURGICAL HISTORY:  The patient had a nasal septal repair in 1988, the right knee arthroscopy in 1995, and a right inguinal hernia repair with mesh in 2002 by Dr. Carolynne Edouard.  FAMILY HISTORY:  Noncontributory to the present case.  SOCIAL HISTORY:  The patient is married.  He denies any tobacco or illicit drug use.  He states he previously used alcohol but quit approximately 5 years ago.  MEDICATIONS:  Include tramadol, baclofen, and vitamin D.  ALLERGIES:  No known drug or latex allergies.  REVIEW OF SYSTEMS:  Please see history of present illness for pertinent findings, otherwise complete 10-system review found negative.  PHYSICAL EXAMINATION:  GENERAL:  A 55 year old Caucasian male who does not appear in much significant distress.  He is nontoxic appearing. VITAL SIGNS:  Showed temperature  of 98.1, heart rate of 74, blood pressure 146/87, respiratory rate of 16, oxygen saturation 97% on room air. HEENT:  Unremarkable. NECK:  Supple without lymphadenopathy.  Trachea is midline.  No thyromegaly or masses. LUNGS:  Clear to auscultation.  No wheezes, rhonchi, or rales.  Normal respiratory effort without use of accessory muscles. HEART:  Regular rate and rhythm.  No murmurs, gallops, or rubs. Carotids are 2+ and brisk without bruits.  Peripheral pulses intact and symmetrical. ABDOMEN:  Soft, nondistended.  No mass effect or hernias are appreciated.  Right inguinal scar is evident, consistent with his inguinal hernia history.  The patient is mildly tender in the epigastrium, but no evidence of peritonitis. RECTAL:   Deferred. MUSCULOSKELETAL:  Limited due to the patient's multiple sclerosis. SKIN:  Warm and dry with good turgor.  No rashes, lesions, or nodules. NEUROLOGIC:  The patient is alert and oriented x3.  DIAGNOSTICS:  CBC performed today shows a white blood cell count of 7.9, hemoglobin of 13.6, hematocrit of 38.7, platelet count of 181. Metabolic panel shows a sodium of 142, potassium of 4.0, chloride of 107, CO2 of 27, BUN of 5, creatinine of 0.8, glucose of 112.  Liver enzymes shows a bilirubin of 0.7, alkaline phosphatase of 93, AST of 227, ALT of 182, lipase significantly elevated greater than 2000.  IMAGING:  Ultrasound performed today again shows gallbladder to be filled with sludge and stones.  No evidence of wall thickening or pericholecystic fluid, common bile duct is mildly dilated at 8.7 mm. Liver and pancreas were not visualized to be abnormal.  IMPRESSION: 1. Biliary pancreatitis. 2. Cholelithiasis. 3. Multiple sclerosis.  RECOMMENDATIONS:  Agree with admission and IV fluids and antibiotics as indicated.  The CT scan is pending to evaluate the pancreas as well as possibility of common bile duct stones.  I believe a Gastroenterology consult has been ordered but I believe they will probably wait on the CT scan and followup enzymes.  I agree with the followup enzymes in the morning.  If his enzymes trend down, would recommend cholecystectomy this admission once his pancreatitis is essentially resolved.  He may or may not need of preoperative ERCP per the GI Service.  We will happily follow along with this patient.  Our plan of care and recommendations were discussed with the patient and his wife who understand and agree to comply.     Brayton El, PA-C   ______________________________ Angelia Mould. Derrell Lolling, M.D.    KB/MEDQ  D:  04/07/2010  T:  04/08/2010  Job:  [id number]  cc:   Isidor Holts, M.D.  Electronically Signed by Brayton El  on 04/19/2010 11:01:12  AM Electronically Signed by Claud Kelp M.D. on 04/20/2010 09:36:46 AM

## 2010-04-21 ENCOUNTER — Inpatient Hospital Stay (HOSPITAL_COMMUNITY): Payer: Managed Care, Other (non HMO)

## 2010-04-21 ENCOUNTER — Encounter: Payer: Self-pay | Admitting: Internal Medicine

## 2010-04-21 DIAGNOSIS — R17 Unspecified jaundice: Secondary | ICD-10-CM

## 2010-04-21 DIAGNOSIS — R1011 Right upper quadrant pain: Secondary | ICD-10-CM

## 2010-04-21 DIAGNOSIS — K859 Acute pancreatitis without necrosis or infection, unspecified: Secondary | ICD-10-CM

## 2010-04-21 LAB — COMPREHENSIVE METABOLIC PANEL
ALT: 492 U/L — ABNORMAL HIGH (ref 0–53)
Alkaline Phosphatase: 223 U/L — ABNORMAL HIGH (ref 39–117)
CO2: 27 mEq/L (ref 19–32)
GFR calc non Af Amer: >60 mL/min (ref >60)
Glucose, Bld: 116 mg/dL — ABNORMAL HIGH (ref 70–99)
Potassium: 4.4 mEq/L (ref 3.5–5.1)
Sodium: 138 mEq/L (ref 135–145)
Total Protein: 6.2 g/dL (ref 6.0–8.3)

## 2010-04-21 LAB — LIPASE, BLOOD: Lipase: 1417 U/L — ABNORMAL HIGH (ref 11–59)

## 2010-04-21 LAB — CBC
HCT: 36.9 % — ABNORMAL LOW (ref 39.0–52.0)
Hemoglobin: 12.4 g/dL — ABNORMAL LOW (ref 13.0–17.0)
RBC: 4.11 MIL/uL — ABNORMAL LOW (ref 4.22–5.81)
WBC: 9.3 10*3/uL (ref 4.0–10.5)

## 2010-04-22 ENCOUNTER — Inpatient Hospital Stay (HOSPITAL_COMMUNITY): Payer: Managed Care, Other (non HMO)

## 2010-04-22 DIAGNOSIS — R932 Abnormal findings on diagnostic imaging of liver and biliary tract: Secondary | ICD-10-CM

## 2010-04-22 LAB — COMPREHENSIVE METABOLIC PANEL
Albumin: 3.3 g/dL — ABNORMAL LOW (ref 3.5–5.2)
BUN: 5 mg/dL — ABNORMAL LOW (ref 6–23)
Calcium: 8.4 mg/dL (ref 8.4–10.5)
Creatinine, Ser: 0.65 mg/dL (ref 0.4–1.5)
Potassium: 3.7 mEq/L (ref 3.5–5.1)
Total Protein: 6.3 g/dL (ref 6.0–8.3)

## 2010-04-22 LAB — DIFFERENTIAL
Eosinophils Absolute: 0 10*3/uL (ref 0.0–0.7)
Eosinophils Relative: 0 % (ref 0–5)
Lymphs Abs: 0.7 10*3/uL (ref 0.7–4.0)

## 2010-04-22 LAB — CBC
MCHC: 33.2 g/dL (ref 30.0–36.0)
MCV: 90.5 fL (ref 78.0–100.0)
Platelets: 256 10*3/uL (ref 150–400)
RDW: 13 % (ref 11.5–15.5)
WBC: 11.7 10*3/uL — ABNORMAL HIGH (ref 4.0–10.5)

## 2010-04-22 MED ORDER — GADOBENATE DIMEGLUMINE 529 MG/ML IV SOLN
14.0000 mL | Freq: Once | INTRAVENOUS | Status: AC
  Administered 2010-04-22: 14 mL via INTRAVENOUS

## 2010-04-23 LAB — CBC
Hemoglobin: 11.9 g/dL — ABNORMAL LOW (ref 13.0–17.0)
MCH: 29.9 pg (ref 26.0–34.0)
MCV: 89.7 fL (ref 78.0–100.0)
RBC: 3.98 MIL/uL — ABNORMAL LOW (ref 4.22–5.81)

## 2010-04-23 LAB — COMPREHENSIVE METABOLIC PANEL
ALT: 272 U/L — ABNORMAL HIGH (ref 0–53)
Albumin: 3 g/dL — ABNORMAL LOW (ref 3.5–5.2)
Alkaline Phosphatase: 338 U/L — ABNORMAL HIGH (ref 39–117)
BUN: 4 mg/dL — ABNORMAL LOW (ref 6–23)
Chloride: 104 mEq/L (ref 96–112)
Glucose, Bld: 88 mg/dL (ref 70–99)
Potassium: 4 mEq/L (ref 3.5–5.1)
Sodium: 137 mEq/L (ref 135–145)
Total Bilirubin: 3.6 mg/dL — ABNORMAL HIGH (ref 0.3–1.2)

## 2010-04-23 LAB — DIFFERENTIAL
Lymphocytes Relative: 8 % — ABNORMAL LOW (ref 12–46)
Lymphs Abs: 0.9 10*3/uL (ref 0.7–4.0)
Monocytes Relative: 12 % (ref 3–12)
Neutro Abs: 8.6 10*3/uL — ABNORMAL HIGH (ref 1.7–7.7)
Neutrophils Relative %: 79 % — ABNORMAL HIGH (ref 43–77)

## 2010-04-23 LAB — LIPASE, BLOOD: Lipase: 92 U/L — ABNORMAL HIGH (ref 11–59)

## 2010-04-24 LAB — COMPREHENSIVE METABOLIC PANEL
Albumin: 3 g/dL — ABNORMAL LOW (ref 3.5–5.2)
Alkaline Phosphatase: 294 U/L — ABNORMAL HIGH (ref 39–117)
BUN: 8 mg/dL (ref 6–23)
Chloride: 102 mEq/L (ref 96–112)
Creatinine, Ser: 0.67 mg/dL (ref 0.4–1.5)
Glucose, Bld: 98 mg/dL (ref 70–99)
Total Bilirubin: 1.9 mg/dL — ABNORMAL HIGH (ref 0.3–1.2)

## 2010-04-24 LAB — AMYLASE: Amylase: 103 U/L (ref 0–105)

## 2010-04-24 LAB — LIPASE, BLOOD: Lipase: 58 U/L (ref 11–59)

## 2010-04-26 NOTE — Procedures (Signed)
Summary: ERCP  Patient: Gadiel John Note: All result statuses are Final unless otherwise noted.  Tests: (1) ERCP (ERC)   ERC ERCP                  DONE     [facility]     [address]     [place], [place]  [address]          ERCP PROCEDURE REPORT          PATIENT:  [name], Herbert  MR#:  [id number]     BIRTHDATE:  01/30/1950  GENDER:  male          ENDOSCOPIST:  Hedwig Morton. Juanda Chance, MD     ASSISTANT:          PROCEDURE DATE:  04/21/2010     PROCEDURE:  ERCP, Diagnostic          INDICATIONS:  abdominal pain, abnormal Liver Function Tests s/p     lap chole, recirrent abd. pain, r/o CBD stone          MEDICATIONS:   Versed 10 mg, Fentanyl 100 mcg, glucagon 0.5 mg     TOPICAL ANESTHETIC:  Cetacaine Spray          DESCRIPTION OF PROCEDURE:   After the risks benefits and     alternatives of the procedure were thoroughly explained, informed     consent was obtained.  The XB-[id number] ([id number]) endoscope was     introduced through the mouth and advanced to the second portion of     the duodenum.          The pancreatic duct was filled to the tail and appeared to be     normal. Care was taken not to overfill the ductal system. normal     appearing MPD  The common bile duct was unsuccessfully cannulated.     The scope was then completely withdrawn from the patient and the     procedure terminated.          COMPLICATIONS:  None          ENDOSCOPIC IMPRESSION:     1) Normal pancreatic duct     biliary opacification failed, due to unability to cannulate CBD,     only MPD was filling, Papila appeared closed and there was no bile     exiting from the Papilla     RECOMMENDATIONS:     see chart, will proceed with MRCP, then decide if another     attempt for CBD cannulation          ______________________________     Hedwig Morton. Juanda Chance, MD          CC:          n.     eSIGNED:   Hedwig Morton. Casimir Barcellos at 04/21/2010 04:31 PM          [name],  [id number]  Note: An exclamation mark (!) indicates a result that was not dispersed into the flowsheet. Document Creation Date: 04/21/2010 4:32 PM _______________________________________________________________________  (1) Order result status: Final Collection or observation date-time: 04/21/2010 16:22 Requested date-time:  Receipt date-time:  Reported date-time:  Referring Physician:   Ordering Physician: Lina Sar [phone]) Specimen Source:  Source: Launa Grill Order Number: [id number] Lab site:

## 2010-04-26 NOTE — H&P (Signed)
[name], TALENT               ACCOUNT NO.:  [id number]  MEDICAL RECORD NO.:  [id number]           PATIENT TYPE:  I  LOCATION:  5121                         FACILITY:  [facility]  PHYSICIAN:  Lennie Muckle, MD      DATE OF BIRTH:  1955/04/24  DATE OF ADMISSION:  04/20/2010 DATE OF DISCHARGE:                             HISTORY & PHYSICAL   ADMISSION DIAGNOSIS:  Gallstone pancreatitis.  PRIMARY CARE PHYSICIAN:  [name], MD  HISTORY OF PRESENT ILLNESS:  [name] is a 55 year old male who had laparoscopic cholecystectomy with cholangiogram on April 09, 2010.  He had been admitted on the first with gallstone pancreatitis, had an elevated lipase, which had normalized.  He underwent his cholecystectomy and was unable to have the cholangiogram due to valves within the cystic duct and the inflammation around the gallbladder.  He had resolution of his symptoms, he was actually doing well until around this morning at 3 a.m. with  acute onset of epigastric discomfort.  The pain was severe. He had episode of nausea and vomiting.  He continues to have discomfort in the epigastric region.  No fevers or chills.  Has not had anything to eat today.  He continues to be uncomfortable.  He came in to the office for evaluation.  We will send him over for serum chemistry panel, which revealed a bilirubin of 2.3, he does have elevation of his AST and ALT to 773 and 583 respectively.  Alkaline phosphatase is 176, glucose elevated at 161, and white count 11.1.  PAST MEDICAL HISTORY:  Reflux pancreatitis.  PAST SURGICAL HISTORY: 1. Sinus surgery. 2. Laparoscopic cholecystectomy. 3. Hernia repair. 4. Tonsillectomy. 5. Hand surgery.  FAMILY HISTORY:  Colon cancer and hypertension.  SOCIAL HISTORY:  He is married, works as an Pension scheme manager.  REVIEW OF SYSTEMS:  History of asthma, bronchitis, shortness of breath, history of weight loss, headaches, fevers, joint pain, and  hoarseness.  MEDICATIONS:  Multiple at home and include, 1. Baclofen. 2. Tramadol. 3. Vitamin D. 4. Protonix. 5. Flonase. 6. Vicodin.  PHYSICAL EXAMINATION:  VITAL SIGNS:  He is a ill-appearing male, blood pressure is 142/86, pulse 72, temp 97.4, and weight is 149. HEENT:  Head is normocephalic.  Extraocular muscles are intact.  Pupils are equal and round.  Sclerae and conjunctivae are clear.  No scleral icterus.  Nares are clear without drainage.  Oral mucosa is slightly dry.  Dentition is good. NECK:  Normal range of motion.  No tenderness.  No lymphadenopathy along the neck or supraclavicular area. CHEST:  Clear to auscultation bilaterally.  Normal expansion and excursion. CARDIOVASCULAR:  Regular rate and rhythm.  No murmurs, gallops, or rubs. ABDOMEN:  Mildly distended, mildly tender to palpation.  No organomegaly.  No masses.  Incisions are without evidence of infection. There is no hernias on examination. MUSCULOSKELETAL:  No pain with palpation of joints.  No deformities are seen.  ASSESSMENT/PLAN:  History of gallstone pancreatitis and new onset epigastric discomfort could be possibility of a retained stone.  Due to his elevation of the liver enzymes, I am going to send him  to the [facility] for direct admit.  I will have Gastroenterology consult for possible MRCP prior to having any intervention.  He understands and will admit for IV fluid hydration.  Repeat serum chemistries in the morning.     Lennie Muckle, MD     ALA/MEDQ  D:  04/20/2010  T:  04/20/2010  Job:  [id number]  cc:   [name], MD  Electronically Signed by Bertram Savin MD on 04/26/2010 12:05:46 PM

## 2010-05-15 LAB — POCT CARDIAC MARKERS
CKMB, poc: <1 ng/mL — ABNORMAL LOW (ref 1.0–8.0)
CKMB, poc: <1 ng/mL — ABNORMAL LOW (ref 1.0–8.0)
Myoglobin, poc: 45 ng/mL (ref 12–200)
Troponin i, poc: <0.05 ng/mL (ref 0.00–0.09)

## 2010-05-15 LAB — CBC
HCT: 43.1 % (ref 39.0–52.0)
Platelets: 195 10*3/uL (ref 150–400)
RDW: 12.4 % (ref 11.5–15.5)
WBC: 9.4 10*3/uL (ref 4.0–10.5)

## 2010-05-15 LAB — DIFFERENTIAL
Basophils Absolute: 0 10*3/uL (ref 0.0–0.1)
Eosinophils Relative: 0 % (ref 0–5)
Lymphocytes Relative: 16 % (ref 12–46)
Lymphs Abs: 1.5 10*3/uL (ref 0.7–4.0)
Neutro Abs: 7.2 10*3/uL (ref 1.7–7.7)
Neutrophils Relative %: 76 % (ref 43–77)

## 2010-05-15 LAB — POCT I-STAT, CHEM 8
Calcium, Ion: 1.14 mmol/L (ref 1.12–1.32)
Chloride: 104 mEq/L (ref 96–112)
Glucose, Bld: 95 mg/dL (ref 70–99)
HCT: 45 % (ref 39.0–52.0)
Hemoglobin: 15.3 g/dL (ref 13.0–17.0)
Potassium: 4.3 mEq/L (ref 3.5–5.1)

## 2010-05-15 LAB — PROTIME-INR
INR: 1 (ref 0.00–1.49)
Prothrombin Time: 13.8 seconds (ref 11.6–15.2)

## 2010-05-16 ENCOUNTER — Other Ambulatory Visit (INDEPENDENT_AMBULATORY_CARE_PROVIDER_SITE_OTHER): Payer: Managed Care, Other (non HMO)

## 2010-05-16 ENCOUNTER — Encounter: Payer: Self-pay | Admitting: Physician Assistant

## 2010-05-16 ENCOUNTER — Ambulatory Visit (INDEPENDENT_AMBULATORY_CARE_PROVIDER_SITE_OTHER): Payer: Managed Care, Other (non HMO) | Admitting: Physician Assistant

## 2010-05-16 ENCOUNTER — Ambulatory Visit: Admitting: Gastroenterology

## 2010-05-16 VITALS — BP 120/80 | HR 64 | Ht 67.0 in | Wt 148.6 lb

## 2010-05-16 DIAGNOSIS — K859 Acute pancreatitis without necrosis or infection, unspecified: Secondary | ICD-10-CM

## 2010-05-16 DIAGNOSIS — R932 Abnormal findings on diagnostic imaging of liver and biliary tract: Secondary | ICD-10-CM

## 2010-05-16 DIAGNOSIS — K851 Biliary acute pancreatitis without necrosis or infection: Secondary | ICD-10-CM

## 2010-05-16 DIAGNOSIS — R945 Abnormal results of liver function studies: Secondary | ICD-10-CM

## 2010-05-16 LAB — HEPATIC FUNCTION PANEL
ALT: 39 U/L (ref 0–53)
AST: 24 U/L (ref 0–37)
Albumin: 4 g/dL (ref 3.5–5.2)
Alkaline Phosphatase: 102 U/L (ref 39–117)
Bilirubin, Direct: 0.3 mg/dL (ref 0.0–0.3)
Total Protein: 6.8 g/dL (ref 6.0–8.3)

## 2010-05-16 NOTE — Progress Notes (Signed)
Subjective:    Patient ID: [name], male    DOB: 06-Jul-1955, 55 y.o.   MRN: [id number]  HPI [name] is a very nice 55 year old white male who was initially seen by Dr. Leone Payor in consultation while he was hospitalized in March. He had presented with abdominal pain and was found to have gallstone-induced pancreatitis. His symptoms rapidly improved, and he had surgical consultation with Dr. Derrell Lolling. He underwent laparoscopic cholecystectomy on 3/3 and tolerated this well. He did not have a cholangiogram done. The patient was discharged in approximately 10 days later developed recurrent intense upper abdominal pain and was readmitted to the hospital. At that time he had elevated liver enzymes with a bilirubin of 5.4 as well as elevated transaminases. His lipase was also greater than 1400. He was then seen in consultation by Dr. Lina Sar  and underwent ERCP on 04/21/2010. This was unsuccessful with inability to cannulate the common bile duct. He then underwent MRCP which showed common bile duct dilation extending into the biliary tree with intrahepatic biliary ductal dilation there was also pancreatic duct dilation consistent with obstruction at the ampulla, no etiology identified- differential suggested including edema/mass effect from pancreatitis versus occult lesion. The patient says his pain lasted for about 13 hours and then gradually resolved. He has not had any recurrent abdominal pain since discharge from the hospital and has been feeling well. His bilirubin had dropped to 1.9 at the time of discharge. He presents now for followup. He had lost weight with the  gallstone pancreatitis and has actually gained 3 pounds back he denies any problems with nausea indigestion abdominal pain fever etc. He does have an acid and says that his legs have been worse since he was ill    Review of Systems  HENT: Negative.   Eyes: Negative.   Respiratory: Negative.   Cardiovascular: Negative.     Gastrointestinal: Negative.   Genitourinary: Negative.   Musculoskeletal: Positive for gait problem.  Skin: Negative.   Neurological: Positive for weakness.  Hematological: Negative.   Psychiatric/Behavioral: Negative.        Objective:   Physical Exam Well-developed white male in no acute distress, he ambulates with difficulty.  Alert and oriented x3  HEENT; nontraumatic normocephalic EOMI PERRLA sclera anicteric  Neck; supple no JVD cardiovascular regular rate and rhythm with S1-S2 no murmur rub or gallop Pulmonary; clear bilaterally  Abdomen; soft nontender nondistended no palpable mass or hepatosplenomegal y Rectal exam; not done Skin benign  Psych; mood and affect normal and appropriate   .                                         Outpatient Prescriptions Prior to Visit  Medication Sig Dispense Refill  . baclofen (LIORESAL) 10 MG tablet Take 10 mg by mouth 3 (three) times daily.        . cetirizine (ZYRTEC) 10 MG tablet Take 10 mg by mouth daily.        . cholecalciferol (VITAMIN D) 1000 UNITS tablet Take 1,000 Units by mouth daily.        . fluticasone (FLONASE) 50 MCG/ACT nasal spray 2 sprays by Nasal route daily.        . Multiple Vitamin (MULTIVITAMIN) capsule Take 1 capsule by mouth daily.        . pantoprazole (PROTONIX) 40 MG tablet Take 40 mg by mouth daily.        [name]  traMADol (ULTRAM-ER) 100 MG 24 hr tablet Take 100 mg by mouth daily.        . Silodosin (RAPAFLO) 8 MG CAPS Take by mouth.        . esomeprazole (NEXIUM) 40 MG capsule Take 40 mg by mouth daily before breakfast.        . glatiramer (COPAXONE) 20 MG/ML injection Inject 20 mg into the skin daily.        [name] L-Methylfolate-B6-B12 (METANX) 3-35-2 MG TABS Take by mouth 2 (two) times daily.        . predniSONE (DELTASONE) 10 MG tablet Take 10 mg by mouth daily.             Assessment & Plan:  #17 55 year old white male with recent episode of gallstone pancreatitis. He is status post cholecystectomy on 04/09/2010  and re- presented about 10 days later with recurrent abdominal pain and at that time had elevated LFTs and evidence for pancreatitis. ERCP was unsuccessful with inability to cannulate the common bile duct and MRCP showed ductal dilation but no evidence for filling defect- there was question of obstruction at the level of the ampulla possibly secondary to edema from pancreatitis.  Patient currently asymptomatic and feeling well  Plan Will repeat liver enzymes and lipase today Schedule followup office visit with Dr. Leone Payor in approximately one month as he will likely need followup ERCP. Patient is advised to call for any recurrent symptoms in the interim  #2 Multiple sclerosis;

## 2010-05-16 NOTE — Progress Notes (Signed)
I think he should have ERCP ASAP since he's at increased risk for recurrent pancreatitis

## 2010-05-16 NOTE — Patient Instructions (Signed)
Please go to the basement level to have your labs drawn.  We will call you with the results. We have made you an appointment to follow up with Dr. Stan Head on 06-21-2010 at 9:45 AM. Appointment card given.

## 2010-05-17 ENCOUNTER — Telehealth: Payer: Self-pay | Admitting: *Deleted

## 2010-05-17 NOTE — Telephone Encounter (Signed)
Left a message for patient to call me back.

## 2010-05-17 NOTE — Telephone Encounter (Signed)
Message copied by Jesse Fall on Tue May 17, 2010  8:25 AM ------      Message from: [place], [place]      Created: Mon May 16, 2010  3:43 PM       Please let pt know his labs are normal. I will review with DR. Gessner-we may  Want to do ercp sooner. We will call him next week after DR gessner returns

## 2010-05-18 ENCOUNTER — Telehealth: Payer: Self-pay | Admitting: *Deleted

## 2010-05-18 NOTE — Telephone Encounter (Signed)
Patient given results and recommendations as per Mike Gip, PA

## 2010-05-18 NOTE — Telephone Encounter (Signed)
Patient given lab results and recommendations as per Mike Gip, PA

## 2010-05-18 NOTE — Telephone Encounter (Signed)
Message copied by Jesse Fall on Wed May 18, 2010  8:23 AM ------      Message from: [place], [place]      Created: Mon May 16, 2010  3:43 PM       Please let pt know his labs are normal. I will review with DR. Gessner-we may  Want to do ercp sooner. We will call him next week after DR gessner returns

## 2010-05-24 ENCOUNTER — Encounter: Payer: Self-pay | Admitting: Internal Medicine

## 2010-05-26 ENCOUNTER — Telehealth: Payer: Self-pay | Admitting: Internal Medicine

## 2010-05-26 NOTE — Telephone Encounter (Signed)
This encounter was created in error - please disregard.

## 2010-05-26 NOTE — Telephone Encounter (Signed)
I called the patient and explained that Dr. Christella Hartigan and I think an EUS is appropriate to evaluate for persistent bile duct abnormality and any retained CBD stones. Dr. Christella Hartigan has agreed to do this so we will need to schedule an EUS for the patient - will need to check with him re: exactly what he needs.

## 2010-05-27 ENCOUNTER — Telehealth: Payer: Self-pay | Admitting: Gastroenterology

## 2010-05-27 DIAGNOSIS — K859 Acute pancreatitis without necrosis or infection, unspecified: Secondary | ICD-10-CM

## 2010-05-27 NOTE — Telephone Encounter (Signed)
Patty, Can you please get in touch with him to schedule upper EUS, radial +/- linear, next available EUS Thursday (may3?).  Diagnosis recent pancreatitis, 793.3

## 2010-05-27 NOTE — Telephone Encounter (Signed)
Message copied by Chales Abrahams on Fri May 27, 2010 11:23 AM ------      Message from: Stan Head      Created: Thu May 26, 2010  4:51 PM      Regarding: schedule EUS       Please see telephone note in chart.      I explained rational for EUS and he is agreeable so please arrange.      Thanks

## 2010-05-27 NOTE — Telephone Encounter (Signed)
Pt scheduled for 06/09/10 EUS

## 2010-05-30 NOTE — Telephone Encounter (Signed)
Ok, thanks.

## 2010-06-07 NOTE — Discharge Summary (Signed)
NAMEJOESIAH, LONON               ACCOUNT NO.:  [id number]  MEDICAL RECORD NO.:  [id number]           PATIENT TYPE:  I  LOCATION:  5148                         FACILITY:  [facility]  PHYSICIAN:  Velora Heckler, MD      DATE OF BIRTH:  [birth year]  DATE OF ADMISSION:  04/21/2010 DATE OF DISCHARGE:  04/24/2010                              DISCHARGE SUMMARY   HISTORY OF PRESENT ILLNESS:  Mr. Everage is a pleasant 55 year old gentleman who was originally seen in early March with evidence of biliary pancreatitis.  He improved and did not need an ERCP at that time.  He underwent a laparoscopic cholecystectomy.  However, the patient was unable to get intraoperative cholangiogram after the time of surgery.  However, he did well from the procedure.  His postoperative enzymes were within normal limits and therefore decision was made to discharge the patient on April 11, 2010.  However, the patient subsequently after almost 2 weeks developed severe abdominal pain again with some nausea and vomiting.  He presented back to the office and Dr. Freida Busman evaluated the patient, sent him for outpatient labs, which showed elevated bilirubin and elevated liver enzymes as well as lipase.  There was concern the patient had residual pancreatitis secondary to a retained common bile duct stone.  Therefore, the decision was made to admit the patient on April 20, 2010, for inpatient management, Gastroenterology consult, and possible ERCP.  SUMMARY OF HOSPITAL COURSE:  The patient was admitted on April 20, 2010. At the time of his admission, his bilirubin was elevated at 5.4 as well as elevated transaminases.  His lipase was elevated at greater than 1400.  His gastroenterologist consulted on the patient and subsequently planned for ERCP.  The ERCP was performed and apparently they were unable to cannulate the ampulla.  The patient was subsequently ordered an MRCP on April 22, 2010, which showed evidence of pancreatic  duct as well as a common bile duct to be dilated.  There were no obvious filling defects.  This was suggested to be either due to retained stone causing obstruction or possibly mass effect from edema from the pancreatitis. The patient's lipase and liver enzymes, however, began trending down with his bilirubin going down to approximately 3 and then ultimately to 1.9 and a lipase trending down ultimately to a normal level.  The patient symptomatically improved as well and was started on a liquid diet, which he tolerated and eventually was advanced to a thicker diet, which he tolerated as well.  In conjunction with Gastroenterology, it was felt that a repeat ERCP was not necessary, and the patient would be stable for discharge home as of April 24, 2010.  DISCHARGE DIAGNOSES: 1. 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 2. Status post laparoscopic cholecystectomy in March 2012. 3. History of primary progressive multiple sclerosis.  PLAN:  The patient would be discharged in a satisfactory condition.  He has postoperative plans for followup with both [facility] as well as Gastroenterology.  He is given prescription for pain medicine p.r.n. to use.  He is to contact  our office with any questions or concerns.     Brayton El, PA-C   ______________________________ Velora Heckler, MD    KB/MEDQ  D:  05/10/2010  T:  05/11/2010  Job:  [id number]  Electronically Signed by Brayton El  on 05/30/2010 02:08:25 PM Electronically Signed by Darnell Level MD on 06/07/2010 11:37:19 AM

## 2010-06-09 ENCOUNTER — Ambulatory Visit (HOSPITAL_COMMUNITY)
Admission: RE | Admit: 2010-06-09 | Discharge: 2010-06-09 | Disposition: A | Discharge status: Home / Self Care | Payer: Managed Care, Other (non HMO) | Source: Ambulatory Visit | Attending: Gastroenterology | Admitting: Gastroenterology

## 2010-06-09 ENCOUNTER — Encounter: Payer: Managed Care, Other (non HMO) | Admitting: Gastroenterology

## 2010-06-09 DIAGNOSIS — R932 Abnormal findings on diagnostic imaging of liver and biliary tract: Secondary | ICD-10-CM

## 2010-06-09 DIAGNOSIS — G35 Multiple sclerosis: Secondary | ICD-10-CM | POA: Insufficient documentation

## 2010-06-09 DIAGNOSIS — K449 Diaphragmatic hernia without obstruction or gangrene: Secondary | ICD-10-CM

## 2010-06-09 DIAGNOSIS — K838 Other specified diseases of biliary tract: Secondary | ICD-10-CM

## 2010-06-09 DIAGNOSIS — K859 Acute pancreatitis without necrosis or infection, unspecified: Secondary | ICD-10-CM

## 2010-06-09 DIAGNOSIS — Z9089 Acquired absence of other organs: Secondary | ICD-10-CM | POA: Insufficient documentation

## 2010-06-09 DIAGNOSIS — R7989 Other specified abnormal findings of blood chemistry: Secondary | ICD-10-CM | POA: Insufficient documentation

## 2010-06-14 ENCOUNTER — Telehealth: Payer: Self-pay

## 2010-06-14 NOTE — Telephone Encounter (Signed)
I spoke with the patient he is feeling much better.  I will cancel the upcoming appointment.  He declines to schedule a screening colon at this time.  He states he will think about it and call back if he is interested.

## 2010-06-14 NOTE — Telephone Encounter (Signed)
Message copied by Darcey Nora on Tue Jun 14, 2010  9:09 AM ------      Message from: Stan Head      Created: Fri Jun 10, 2010  5:00 PM       He is coming to see me later in May.      If he is feeling well he does not need to see me.      i do not think as far as I can tell, that he has had a screening colonoscopy. He should if he has not. Could set that up and just have a pre-visit or could see me in office and we could do then.            ----- Message -----         From: Rob Bunting, MD         Sent: 06/09/2010   9:56 AM           To: Iva Boop, MD                        EUS looked normal: he feels perfectly fine.            Can follow clinically.            dan

## 2010-06-21 ENCOUNTER — Ambulatory Visit: Payer: Managed Care, Other (non HMO) | Admitting: Internal Medicine

## 2010-06-22 ENCOUNTER — Encounter (INDEPENDENT_AMBULATORY_CARE_PROVIDER_SITE_OTHER): Payer: Self-pay | Admitting: General Surgery

## 2010-06-24 NOTE — Assessment & Plan Note (Signed)
[facility]                           PRIMARY CARE OFFICE NOTE   [name], [name]                      MRN:          [id number]  DATE:05/17/2006                            DOB:          [birth year]    The patient is a 55 year old male who presents for a wellness  examination.   ALLERGIES:  None.   PAST MEDICAL HISTORY:  Hernia surgery several years ago.  History of  right knee surgery.   FAMILY HISTORY:  Father had cancer, likely of the lungs.  Mother has  diabetes.   SOCIAL HISTORY:  He is married with 2 grown children.  He does not  smoke.  Exercising regular.  He has been working on [employer].   REVIEW OF SYSTEMS:  No chest pain or shortness of breath.  No syncope.  No neurologic complaints.  Complains of pain in the left lower back.  Often times when it is cold he feels he starts to limp and feels like  his left hip is locking on him.  No other joint involvement.  No fever  or chills, no urinary symptoms.  The rest of the 18 point review of  systems is negative.   PHYSICAL EXAMINATION:  Blood pressure 104/69, pulse 69, temperature  97.3, weight 155 pounds.  Looks well, in no acute distress.  HEENT:  With moist mucosa.  NECK:  Supple.  No thyromegalyor bruit.  LUNGS:  Clear.  No wheeze or rales.  HEART:  S1 and S2.  No murmur.  No gallop.  ABDOMEN:  Soft and nontender.  No organomegaly or mass felt.  There is a  small left inguinal hernia palpable.  Lower extremities without edema.  He is alert, oriented and cooperative.  He denies being depressed.  Skin examination reveals flaky skin consistent with seborrheic  dermatitis on the face.  LS spine without deformities.  Left sacroiliac joint is tender to  palpation and painfull with the range of motion.  Hips  are normal.  Straight leg elevations negative bilaterally.  Prostate exam normal. Rectal - normal.   Labs on May 08, 2006 CBC normal, CMET normal, cholesterol 167, TSH  normal, PSA 0.97.  Urinalysis normal.  EKG with normal sinus rhythm.   ASSESSMENT/PLAN:  1. Normal wellness examination.  Age/health related issues discussed.      Healthy lifestyle discussed.  Obtain chest x-ray.  He will check      his house for radon presence.  Get a colonoscopy with Dr. Marina Goodell.      Repeat exam in 12 months.  2. 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 Obtain x-ray of      the left SI joint.  Naproxen 500 p.o. b.i.d.  Osteopathic consult      per Dr. Artist Pais.  May need additional blood work.  3. Cough.  Have radiology obtain chest x-ray.  4. Seborrheic dermatitis of the face.  Lotrisone prescribed to use      twice daily.  5. Left inguinal hernia, asymptomatic.  He will let me know if it      needs to be repaired.  [name], MD  Electronically Signed    AVP/MedQ  DD: 05/21/2006  DT: 05/21/2006  Job #: [id number]   cc:   Tama Headings. Marina Goodell, M.D.  Barbette Hair. Artist Pais, DO

## 2010-06-24 NOTE — Op Note (Signed)
[place]. [facility]  Patient:    [name], [name]                      MRN: [id number] Proc. Date: 09/13/00 Adm. Date:  [id number] Disc. Date: [id number] Attending:  Caleen Essex                           Operative Report  PREOPERATIVE DIAGNOSIS:  Right inguinal hernia.  POSTOPERATIVE DIAGNOSIS:  Right inguinal hernia.  PROCEDURE:  Right inguinal hernia with mesh.  SURGEON:  Ollen Gross. Vernell Morgans, M.D.  ANESTHESIA:  General endotracheal.  DESCRIPTION OF PROCEDURE:  After informed consent was obtained, the patient was brought to the operating room and placed in the supine position on the operating room table.  After adequate induction of general anesthesia, the patients right groin and abdomen were prepped with Betadine and draped in the usual sterile manner.  An incision was then made with a 15 blade knife from the right edge of the pubic bone towards the anterior-superior iliac spine. This incision was carried down through the skin and subcutaneous tissue using the Bovie electrocautery until the fascia of the external oblique was encountered.  The external oblique fascia was incised with the 15 blade knife along its fibers towards the apex of the external ring.  A pair of Metzenbaum scissors was then placed underneath this fascia layer to make sure there were no adhesions of the nerve, and then the Metzenbaums were then used to open the external oblique layer to the apex of the external ring.  The spermatic cord was readily identified and surrounded between two fingers at the edge of the pubic tubercle.  A 1/2-inch Penrose drain was then placed around the cord structures for retraction purposes.  The vas deferens was identified, and care was take to keep this separate from the dissection area.  Using blunt dissection and DeBakey forceps, the structures of the spermatic cord were separated, and the hernia sac was identified.  The hernia sac was opened,  and there were no visceral contents within the sac.  The sac was then ligated near its base with a 2-0 suture ligature.  The sac was excised and sent to pathology for identification.  The floor of the inguinal canal was then reinforced with a 3 x 6 piece of Prolene mesh.  This was sewed inferiorly in a running fashion with a 2-0 Prolene stitch to the shelving edge of the inguinal ligament.  The mesh was then tacked in multiple places superiorly also using 2-0 Prolene sutures to the strength layer of the aponeurotic muscular transversalis layer.  The tails of the mesh were wrapped around the spermatic cord, and these were anchored laterally with interrupted 2-0 Prolene sutures, taking care not to make the opening for the cord too tight.  The rest of the tails of the mesh was then tucked up laterally underneath the external oblique fascia.  Once this was complete, the external oblique was reapproximated using a running 2-0 Prolene stitch.  The Campers fascia and the subcutaneous tissue was reapproximated using interrupted 3-0 Vicryl stitches, and the skin was closed with a running 4-0 Monocryl subcuticular stitch.  Benzoin and Steri-Strips, and a sterile dressing were applied.  The patient tolerated the procedure well.  At the end of the case, all needle, sponge, and instrument counts were correct.  The patient was awakened and taken to the recovery room in  stable condition. DD:  09/17/00 TD:  09/17/00 Job: [id number]

## 2010-06-24 NOTE — Op Note (Signed)
[place]. [facility]  Patient:    [name], [name]                        MRN: [id number] Proc. Date: 03/06/00 Adm. Date:  [id number] Attending:  Susa Day                           Operative Report  PREOPERATIVE DIAGNOSIS:  Tenosynovitis, right index and long finger extensor tendons, overlying carpal bosse.  POSTOPERATIVE DIAGNOSIS:  Tenosynovitis, right index and long finger extensor tendons, overlying carpal bosse.  OPERATION PERFORMED: 1. Tenosynovectomy, left fourth dorsal compartment involving primarily the    index and long finger extensor tendons and extensor carpi radialis brevis. 2. Resection of a carpal bosse, i.e. adjacent osteophytes on the index    metacarpal base, long finger metacarpal base and capitate.  SURGEON:  Katy Fitch. Sypher, Montez Hageman., M.D.  ASSISTANT:  Jonni Sanger, P.A.  ANESTHESIA:  General by LMA.  SUPERVISING ANESTHESIOLOGIST:  Dr. Jacklynn Bue.  INDICATIONS FOR PROCEDURE:  The patient is a 55 year old man who presented for evaluation of pain and swelling on the dorsal aspect of his right hand.  He was kindly referred by his family physician, Dr. Alwyn Ren.  Evaluation in the office revealed signs of a carpal bosse and marked tenosynovitis or ganglion developing in the fourth dorsal compartment.  We recommended excisional biopsy for diagnosis and resolution of his bosse.  DESCRIPTION OF PROCEDURE:  Matheson Vandehei was brought to the operating room and placed in supine position on the operating table.  Following induction of general anesthesia by LMA, the right arm was prepped with Betadine soap and solution and sterilely draped.  A pneumatic tourniquet was applied to the proximal brachium.  Following exsanguination of the limb with an Esmarch bandage, the arterial tourniquet was inflated to 220 mmHg.  The procedure commenced with a transverse incision directly over the mass.  Subcutaneous tissues were carefully  divided taking care to identify the fourth dorsal compartment and dorsal neurovascular structures.  The compartment was bulging with inflammatory tenosynovium.  The fourth dorsal compartment was incised longitudinally and a complete tenosynovectomy was performed to the level of the proximal row of the carpus stripping all tenosynovium off of the extensor carpi radialis brevis, the extensors to the index finger and the extensor to the long finger.  The extensor to the ring and small finger were not involved. The carpal bosse was prominent deep to the tendons and probably the source of the tenosynovitis.  The feeding vessels to the tenosynovium were electrocauterized by bipolar current.  A 6 mm osteotome was then used to remove the osteophyte at the base of the long finger metacarpal.  A House curet was used to then remove the adjacent osteophytes on the index finger metacarpal and the capitate. Dissection was completed with hyaline articular cartilage could be seen along all of the margins of the articulation with the long finger metacarpal.  The capsule which had been carefully preserved on a proximally based flap was then repaired with mattress sutures of 4-0 Mersilene.  The wound was irrigated and repaired with intradermal 3-0 Prolene.  The wound was dressed with Steri-Strips, sterile gauze and a volar plaster splint. There were no apparent complications.  The patient was transferred to the recovery room with stable vital signs. DD:  03/06/00 TD:  03/06/00 Job: [id number]

## 2010-07-01 ENCOUNTER — Other Ambulatory Visit: Payer: Self-pay | Admitting: Internal Medicine

## 2010-07-13 ENCOUNTER — Other Ambulatory Visit: Payer: Self-pay | Admitting: Internal Medicine

## 2010-07-13 ENCOUNTER — Other Ambulatory Visit: Payer: Managed Care, Other (non HMO)

## 2010-07-13 DIAGNOSIS — Z0000 Encounter for general adult medical examination without abnormal findings: Secondary | ICD-10-CM

## 2010-07-13 DIAGNOSIS — Z0389 Encounter for observation for other suspected diseases and conditions ruled out: Secondary | ICD-10-CM

## 2010-07-14 ENCOUNTER — Other Ambulatory Visit (INDEPENDENT_AMBULATORY_CARE_PROVIDER_SITE_OTHER): Payer: Managed Care, Other (non HMO)

## 2010-07-14 DIAGNOSIS — Z0000 Encounter for general adult medical examination without abnormal findings: Secondary | ICD-10-CM

## 2010-07-14 DIAGNOSIS — Z0389 Encounter for observation for other suspected diseases and conditions ruled out: Secondary | ICD-10-CM

## 2010-07-14 LAB — LIPID PANEL
Cholesterol: 172 mg/dL (ref 0–200)
VLDL: 23.6 mg/dL (ref 0.0–40.0)

## 2010-07-14 LAB — CBC WITH DIFFERENTIAL/PLATELET
Eosinophils Relative: 1.5 % (ref 0.0–5.0)
HCT: 41.8 % (ref 39.0–52.0)
Hemoglobin: 14.1 g/dL (ref 13.0–17.0)
Lymphs Abs: 1.3 10*3/uL (ref 0.7–4.0)
MCV: 94 fl (ref 78.0–100.0)
Monocytes Absolute: 0.5 10*3/uL (ref 0.1–1.0)
Monocytes Relative: 8.9 % (ref 3.0–12.0)
Neutro Abs: 3.3 10*3/uL (ref 1.4–7.7)
RDW: 12.8 % (ref 11.5–14.6)
WBC: 5.2 10*3/uL (ref 4.5–10.5)

## 2010-07-14 LAB — BASIC METABOLIC PANEL
BUN: 15 mg/dL (ref 6–23)
Calcium: 8.7 mg/dL (ref 8.4–10.5)
Creatinine, Ser: 0.9 mg/dL (ref 0.4–1.5)
GFR: 99.3 mL/min (ref >60.00)
Glucose, Bld: 106 mg/dL — ABNORMAL HIGH (ref 70–99)
Potassium: 4 mEq/L (ref 3.5–5.1)

## 2010-07-14 LAB — TSH: TSH: 1.54 u[IU]/mL (ref 0.35–5.50)

## 2010-07-14 LAB — HEPATIC FUNCTION PANEL
AST: 17 U/L (ref 0–37)
Bilirubin, Direct: 0.1 mg/dL (ref 0.0–0.3)
Total Bilirubin: 0.6 mg/dL (ref 0.3–1.2)

## 2010-07-14 LAB — URINALYSIS
Hgb urine dipstick: NEGATIVE
Nitrite: NEGATIVE
Specific Gravity, Urine: 1.02 (ref 1.000–1.030)
Total Protein, Urine: NEGATIVE
Urine Glucose: NEGATIVE

## 2010-07-15 ENCOUNTER — Telehealth: Payer: Self-pay | Admitting: *Deleted

## 2010-07-15 NOTE — Telephone Encounter (Signed)
Rec rf req for tramadol 100mg  1-2 qd prn. # 60. Last filled 06-17-10. Ok to rf?

## 2010-07-18 MED ORDER — TRAMADOL HCL ER 100 MG PO TB24: 100.0000 mg | ORAL_TABLET | Freq: Every day | ORAL | Status: DC | PRN

## 2010-07-18 NOTE — Telephone Encounter (Signed)
OK to fill this prescription with additional refills x5 Thank you!  

## 2010-07-20 ENCOUNTER — Ambulatory Visit (INDEPENDENT_AMBULATORY_CARE_PROVIDER_SITE_OTHER): Payer: Managed Care, Other (non HMO) | Admitting: Internal Medicine

## 2010-07-20 ENCOUNTER — Encounter: Payer: Self-pay | Admitting: Internal Medicine

## 2010-07-20 VITALS — BP 100/74 | HR 76 | Temp 98.3°F | Resp 16 | Ht 67.0 in | Wt 147.0 lb

## 2010-07-20 DIAGNOSIS — G35 Multiple sclerosis: Secondary | ICD-10-CM

## 2010-07-20 DIAGNOSIS — L219 Seborrheic dermatitis, unspecified: Secondary | ICD-10-CM

## 2010-07-20 DIAGNOSIS — R269 Unspecified abnormalities of gait and mobility: Secondary | ICD-10-CM

## 2010-07-20 DIAGNOSIS — G4700 Insomnia, unspecified: Secondary | ICD-10-CM

## 2010-07-20 DIAGNOSIS — Z0000 Encounter for general adult medical examination without abnormal findings: Secondary | ICD-10-CM | POA: Insufficient documentation

## 2010-07-20 DIAGNOSIS — J45901 Unspecified asthma with (acute) exacerbation: Secondary | ICD-10-CM

## 2010-07-20 MED ORDER — TRAMADOL HCL ER 100 MG PO TB24: 100.0000 mg | ORAL_TABLET | Freq: Every day | ORAL | Status: DC | PRN

## 2010-07-20 MED ORDER — PANTOPRAZOLE SODIUM 40 MG PO TBEC: 40.0000 mg | DELAYED_RELEASE_TABLET | Freq: Every day | ORAL | Status: DC

## 2010-07-20 NOTE — Assessment & Plan Note (Signed)
Use MDI prn 

## 2010-07-20 NOTE — Assessment & Plan Note (Addendum)
Worse. On Rx 

## 2010-07-20 NOTE — Assessment & Plan Note (Signed)
On Rx 

## 2010-07-20 NOTE — Assessment & Plan Note (Signed)
We discussed age appropriate health related issues, including available/recomended screening tests and vaccinations. We discussed a need for adhering to healthy diet and exercise. Labs/EKG were reviewed/ordered. All questions were answered.   

## 2010-07-20 NOTE — Progress Notes (Signed)
  Subjective:    Patient ID: [name], male    DOB: [birth year], 55 y.o.   MRN: [id number]  HPI  The patient is here for a wellness exam. The patient has been doing well overall without major physical or psychological issues going on lately except for MS  Review of Systems  Constitutional: Negative for appetite change, fatigue and unexpected weight change.  HENT: Negative for nosebleeds, congestion, sore throat, sneezing, trouble swallowing and neck pain.   Eyes: Negative for itching and visual disturbance.  Respiratory: Negative for cough.   Cardiovascular: Negative for chest pain, palpitations and leg swelling.  Gastrointestinal: Negative for nausea, diarrhea, blood in stool and abdominal distention.  Genitourinary: Negative for frequency and hematuria.  Musculoskeletal: Positive for gait problem. Negative for back pain and joint swelling.  Skin: Negative for rash.  Neurological: Positive for weakness and numbness. Negative for dizziness, tremors and speech difficulty.  Psychiatric/Behavioral: Negative for sleep disturbance, dysphoric mood and agitation. The patient is not nervous/anxious.        Objective:   Physical Exam  Constitutional: He is oriented to person, place, and time. He appears well-developed.  HENT:  Mouth/Throat: Oropharynx is clear and moist.  Eyes: Conjunctivae are normal. Pupils are equal, round, and reactive to light.  Neck: Normal range of motion. No JVD present. No thyromegaly present.  Cardiovascular: Normal rate, regular rhythm, normal heart sounds and intact distal pulses.  Exam reveals no gallop and no friction rub.   No murmur heard. Pulmonary/Chest: Effort normal and breath sounds normal. No respiratory distress. He has no wheezes. He has no rales. He exhibits no tenderness.  Abdominal: Soft. Bowel sounds are normal. He exhibits no distension and no mass. There is no tenderness. There is no rebound and no guarding.  Musculoskeletal: Normal range  of motion. He exhibits no edema and no tenderness.       Ataxic LEs are weak  Lymphadenopathy:    He has no cervical adenopathy.  Neurological: He is alert and oriented to person, place, and time. He has normal reflexes. No cranial nerve deficit. He exhibits normal muscle tone. Coordination normal.  Skin: Skin is warm and dry. No rash noted.  Psychiatric: He has a normal mood and affect. His behavior is normal. Judgment and thought content normal.       Raspy voice  Lab Results  Component Value Date   WBC 5.2 07/14/2010   HGB 14.1 07/14/2010   HCT 41.8 07/14/2010   PLT 167.0 07/14/2010   CHOL 172 07/14/2010   TRIG 118.0 07/14/2010   HDL 45.80 07/14/2010   LDLDIRECT 118.4 06/09/2009   ALT 19 07/14/2010   AST 17 07/14/2010   NA 142 07/14/2010   K 4.0 07/14/2010   CL 101 07/14/2010   CREATININE 0.9 07/14/2010   BUN 15 07/14/2010   CO2 30 07/14/2010   TSH 1.54 07/14/2010   PSA 1.30 07/14/2010   INR 1.0 08/24/2008      Assessment & Plan:

## 2010-08-11 ENCOUNTER — Other Ambulatory Visit: Payer: Self-pay | Admitting: Internal Medicine

## 2010-08-11 NOTE — Telephone Encounter (Signed)
Ok to Rf? 

## 2010-08-18 ENCOUNTER — Telehealth: Payer: Self-pay | Admitting: *Deleted

## 2010-08-18 NOTE — Telephone Encounter (Signed)
rec rf req for Tramadol 50mg . 1-2 po bid. # 120. Ok to Rf?

## 2010-08-19 ENCOUNTER — Other Ambulatory Visit: Payer: Self-pay | Admitting: *Deleted

## 2010-08-19 MED ORDER — TRAMADOL HCL ER 100 MG PO TB24: 100.0000 mg | ORAL_TABLET | Freq: Every day | ORAL | Status: DC | PRN

## 2010-08-19 NOTE — Telephone Encounter (Signed)
OK to fill this prescription with additional refills x5 Thank you!  

## 2010-10-07 ENCOUNTER — Inpatient Hospital Stay (HOSPITAL_COMMUNITY): Payer: Managed Care, Other (non HMO)

## 2010-10-07 ENCOUNTER — Observation Stay (HOSPITAL_COMMUNITY)
Admission: AD | Admit: 2010-10-07 | Discharge: 2010-10-08 | Disposition: A | Discharge status: Home / Self Care | Payer: Managed Care, Other (non HMO) | Source: Ambulatory Visit | Attending: Neurology | Admitting: Neurology

## 2010-10-07 DIAGNOSIS — R259 Unspecified abnormal involuntary movements: Secondary | ICD-10-CM | POA: Insufficient documentation

## 2010-10-07 DIAGNOSIS — G35 Multiple sclerosis: Principal | ICD-10-CM | POA: Insufficient documentation

## 2010-10-07 DIAGNOSIS — R269 Unspecified abnormalities of gait and mobility: Secondary | ICD-10-CM | POA: Insufficient documentation

## 2010-10-07 DIAGNOSIS — R209 Unspecified disturbances of skin sensation: Secondary | ICD-10-CM | POA: Insufficient documentation

## 2010-10-07 LAB — COMPREHENSIVE METABOLIC PANEL
Alkaline Phosphatase: 79 U/L (ref 39–117)
BUN: 14 mg/dL (ref 6–23)
Calcium: 9.2 mg/dL (ref 8.4–10.5)
Creatinine, Ser: 0.76 mg/dL (ref 0.50–1.35)
GFR calc Af Amer: >60 mL/min (ref >60)
Glucose, Bld: 98 mg/dL (ref 70–99)
Total Protein: 7 g/dL (ref 6.0–8.3)

## 2010-10-07 LAB — CBC
HCT: 40.2 % (ref 39.0–52.0)
Hemoglobin: 14 g/dL (ref 13.0–17.0)
MCH: 31 pg (ref 26.0–34.0)
MCHC: 34.8 g/dL (ref 30.0–36.0)

## 2010-10-07 LAB — TSH: TSH: 1.672 u[IU]/mL (ref 0.350–4.500)

## 2010-10-07 LAB — VITAMIN B12: Vitamin B-12: 502 pg/mL (ref 211–911)

## 2010-10-07 MED ORDER — GADOBENATE DIMEGLUMINE 529 MG/ML IV SOLN
13.0000 mL | Freq: Once | INTRAVENOUS | Status: AC
  Administered 2010-10-07: 13 mL via INTRAVENOUS

## 2010-10-10 NOTE — Discharge Summary (Signed)
[name], [name]               ACCOUNT NO.:  [id number]  MEDICAL RECORD NO.:  [id number]  LOCATION:  3031                         FACILITY:  [facility]  PHYSICIAN:  Thana Farr, MD    DATE OF BIRTH:  [birth year]  DATE OF ADMISSION:  10/07/2010 DATE OF DISCHARGE:  10/08/2010                              DISCHARGE SUMMARY   ADMISSION DIAGNOSES: 1. Gait instability. 2. Paresthesias and pain in bilateral lower extremities. 3. Primary progressive multiple sclerosis.  DISCHARGE DIAGNOSIS:   1. Progression of primary progressive multiple sclerosis. 2. BLE pain 3. Gait instability  HISTORY:  [name] is a 55 year old male that was admitted with painful paresthesias in the anterior thighs bilaterally as well as lower back pain, and difficulty with gait.  The patient is followed by outpatient neurologist, Dr. Porfirio Mylar Dohmeier who referred him for admission.  PAST MEDICAL HISTORY:  As dictated in H and P.  HOME MEDICATIONS:  As dictated in my H and P.  INITIAL PHYSICAL EXAM:  The patient was able to ambulate, but did have approximately 4/5 strength in the left lower extremity.  There was also decreased sensation in the left thigh.  Deep tendon reflexes are 2+. Plantars are equivocal.  Finger-to-nose and heel-to-shin intact.  HOSPITAL COURSE:  The patient had MRI of the LS spine to rule out active lesion from MS.  Some minimal degenerative changes were noted but no cord abnormalities were seen.  Cord was intact.  The patient did not have any bowel or bladder symptoms.  On day #2, ambulated without assistance with a limp.  Has been on p.o. steroids in the past, but had side effects to such and did not feel his current symptoms were at a point that he would like to go on p.o. steroids again.  This was discussed at length.  The patient was started on Neurontin and his baclofen was increased.  This did significantly improve his dysesthesias and pain.  With no further intervention  to be provided at this time, it was decided that the patient would be discharged to home.  DISCHARGE CONDITION:  Good.  PLAN: 1. The patient is to continue on baclofen 15 mg p.o. t.i.d. 2. The patient started on Neurontin.  He did have some lightheadedness     as a consequence and therefore to continue with 100 mg at home that     he may increase to 200 mg as needed t.i.d. 3. The patient remained out of work for a week. 4. The patient is to continue activity as tolerated.  He did decline     physical therapy and reports that he has exercises that he has     received previously at home from physical therapy to continue and     is to employ such. 5. The patient is to follow with Dr. Vickey Huger on Tuesday of next week.  DISCHARGE MEDICATIONS:  With the patient to continue home medications with only changes to include increase of baclofen to 15 mg p.o. t.i.d. and addition of Neurontin 100 to 200 mg p.o. t.i.d.  Discharge required an hour of conversation with family and face-to-face time in discharge planning.  ______________________________ Thana Farr, MD     LR/MEDQ  D:  10/08/2010  T:  10/08/2010  Job:  [id number]  Electronically Signed by Thana Farr MD on 10/10/2010 06:36:50 AM

## 2010-10-10 NOTE — H&P (Signed)
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  HISTORY:  Mr. Faro is a 55 year old male who was diagnosed with multiple sclerosis in June 2010, by spinal tap.  Has been on multiple new modulating agents.  None have been helpful for his disease.  He is currently on no injectables.  Reports that on Monday he began to note a burning sensation on his thighs anteriorly and bilaterally.  Initially, this pain was controllable, but as of yesterday the pain began to be more severe.  Despite his baseline medications, he was unable to rest last night.  On this morning, he was unable to control his legs well for ambulating.  Called his primary neurologist who recommended admission. The patient has had imaging with last imaging being September 16, 2010.  At that time, he had multiple lesions in both the cervical region and in the brain that was felt to be chronic demyelinating lesions, but no acute lesions were noted.  The patient also had an MRI of the thoracic and lumbar spine.  Along with the demyelinating plaques, there were noted disk bulges at T3-4, T5-6 and T7-8, but no evidence of any cord compromise.  The patient is not admitted for further evaluation.  The patient along with his weakness has had burning sensation in the anterior aspect of his thighs bilaterally.  He rates this uncomfortable sensation and pain at a maximum of 8/10.  It has now began to involve his low back and upper buttocks region as well. The patient also reports worsening of his headache.  Describes the headache is bifrontal.  Rates the headache at a 3-4/10 pain  level.  The patient does have headaches at baseline, but they are not usually this  persistent.  This particular headache has been persistent since Monday of this week.  PAST MEDICAL HISTORY:  Primary progressive multiple sclerosis.  MEDICATIONS AT HOME: 1. Baclofen 10 mg p.o. t.i.d. 2. Protonix 40 mg p.o. daily. 3. Tramadol 10-20 mg p.o. daily. 4. Vitamin D3 2000 international units daily. 5. Multivitamins.  SOCIAL HISTORY:  The patient is married.  He works with [employer]. There is no history of alcohol, tobacco or illicit drug abuse.  He is a Systems analyst.  He was exposed to anthrax vaccinations.  PHYSICAL EXAMINATION:  HEENT:  Unremarkable. LUNGS:  Clear to auscultation. CARDIOVASCULAR:  Single S1-S2. NEUROLOGIC:  On mental status testing, the patient is alert and oriented.  He can follow commands without difficulty.  Speech is fluent. On cranial nerve testing II, visual fields grossly intact.  III, IV, VI extraocular movements intact.  V and VII smile symmetric.  VIII grossly intact.  IX and 10 positive gag.  XI bilateral shoulder shrug.  XII midline tongue extension on motor exam, the patient has 5/5 in the bilateral upper extremities.  He gets  5/5 strength in the right lower extremity as well.  The patient has 4/5 strength in the left hip flexor. He has 5-/5 in the quadriceps on the left.  Hamstrings are 3+/5. Plantar flexion is 4/5.  Plantar extension is 4/5.  On sensory testing, the patient has some decreased pinprick on the medial aspect of the calf on the right.  There is also some decreased pinprick on the lateral aspect of the calf, although less so than that medially.  No other sensory findings are noted.  Deep tendon reflexes are 2+ throughout. Plantars are mute bilaterally.  On cerebellar testing, finger-to-nose and heel-to-shin intact.  ASSESSMENT:  Mr. Eley is a 55 year old male with a primary progressive multiple sclerosis and now worsening of his gait  and pain.  He also describes some mild headache as well.  He has had a steroids in the past, but he experienced uncomfortable tachycardia and palpitations related to such.  Unless absolutely necessary does not wish to have steroids again.  He has not responded to injectables in the past.  PLAN: 1. The patient has MRI of the LS spine with contrast to look for new     acute lesions. 2. We will attempt pain management with increase in baclofen to 15 mg     p.o. t.i.d. 3. Blood work to be performed to rule out other possibilities of pain and     paresthesias to include B12, TSH with routine studies performed     which includes CBC and CMP as well.          ______________________________ Thana Farr, MD     LR/MEDQ  D:  10/07/2010  T:  10/07/2010  Job:  [id number]  Electronically Signed by Thana Farr MD on 10/10/2010 06:35:01 AM

## 2010-11-03 LAB — OLIGOCLONAL BANDS, CSF + SERM
Albumin, CSF: 19 mg/dL (ref 0–35)
CSF Oligoclonal Bands: POSITIVE
IgG/Albumin Ratio, CSF: 0.27 ratio — ABNORMAL HIGH (ref 0.09–0.25)

## 2010-11-03 LAB — CSF CELL COUNT WITH DIFFERENTIAL
Tube #: 3
WBC, CSF: 2

## 2010-11-03 LAB — PROTEIN AND GLUCOSE, CSF
Glucose, CSF: 57
Total  Protein, CSF: 35

## 2010-11-03 LAB — CSF CULTURE W GRAM STAIN

## 2010-11-14 ENCOUNTER — Telehealth: Payer: Self-pay | Admitting: *Deleted

## 2010-11-14 NOTE — Telephone Encounter (Signed)
OK to fill this prescription with additional refills x11 Thank you!  

## 2010-11-14 NOTE — Telephone Encounter (Signed)
Pt left VM req RFs -  1. Gabapentin 100mg  1 po tid #150 2. Bacolfen 10 mg 1 and 1/2 tid 3. meloxicam 7.5 mg 1 qd

## 2010-11-14 NOTE — Telephone Encounter (Signed)
rec rf req for Gabapentin 100mg  1 po tid #150. Rx was originally written on 09.1.12 by Dr. Thana Farr. Please advise ok to Rf?

## 2010-11-15 MED ORDER — GABAPENTIN 100 MG PO CAPS: 100.0000 mg | ORAL_CAPSULE | Freq: Three times a day (TID) | ORAL | Status: DC

## 2010-11-15 MED ORDER — MELOXICAM 7.5 MG PO TABS: 7.5000 mg | ORAL_TABLET | Freq: Every day | ORAL | Status: DC

## 2010-11-15 MED ORDER — BACLOFEN 10 MG PO TABS: 10.0000 mg | ORAL_TABLET | Freq: Three times a day (TID) | ORAL | Status: DC

## 2010-11-15 NOTE — Telephone Encounter (Signed)
Left detailed mess informing pt Rfs done.

## 2010-11-17 ENCOUNTER — Other Ambulatory Visit: Payer: Self-pay

## 2010-11-17 NOTE — Telephone Encounter (Signed)
Pt called stating that the refilled quantity of Gabapentin and Meloxicam was incorrect. Pt was Rx'd Meloicam 7.5 1 tab bid and Gabapentin 100 mg caps, 2 caps tid. Both Rxs have been updated, okay to refill?

## 2010-11-18 ENCOUNTER — Telehealth: Payer: Self-pay | Admitting: *Deleted

## 2010-11-18 NOTE — Telephone Encounter (Signed)
[name], CMA Certified Medical Assistant Signed  11/17/2010 9:11 AM     Pt called stating that the refilled quantity of Gabapentin and Meloxicam was incorrect. Pt was Rx'd Meloicam 7.5 1 tab bid and Gabapentin 100 mg caps, 2 caps tid. Both Rxs have been updated, okay to refill?

## 2010-11-22 MED ORDER — GABAPENTIN 100 MG PO CAPS: 200.0000 mg | ORAL_CAPSULE | Freq: Three times a day (TID) | ORAL | Status: DC

## 2010-11-22 MED ORDER — MELOXICAM 7.5 MG PO TABS: 7.5000 mg | ORAL_TABLET | Freq: Two times a day (BID) | ORAL | Status: DC

## 2010-11-22 NOTE — Telephone Encounter (Signed)
done

## 2010-11-22 NOTE — Telephone Encounter (Signed)
Pls correct OK x 12 mo each Thx

## 2011-01-12 ENCOUNTER — Other Ambulatory Visit: Payer: Self-pay | Admitting: Internal Medicine

## 2011-01-24 ENCOUNTER — Encounter: Payer: Self-pay | Admitting: Internal Medicine

## 2011-01-24 ENCOUNTER — Ambulatory Visit (INDEPENDENT_AMBULATORY_CARE_PROVIDER_SITE_OTHER): Payer: Managed Care, Other (non HMO) | Admitting: Internal Medicine

## 2011-01-24 VITALS — BP 118/86 | HR 72 | Temp 98.4°F | Resp 16 | Wt 152.0 lb

## 2011-01-24 DIAGNOSIS — R209 Unspecified disturbances of skin sensation: Secondary | ICD-10-CM

## 2011-01-24 DIAGNOSIS — K4090 Unilateral inguinal hernia, without obstruction or gangrene, not specified as recurrent: Secondary | ICD-10-CM

## 2011-01-24 DIAGNOSIS — M545 Low back pain: Secondary | ICD-10-CM

## 2011-01-24 DIAGNOSIS — R269 Unspecified abnormalities of gait and mobility: Secondary | ICD-10-CM

## 2011-01-24 DIAGNOSIS — R202 Paresthesia of skin: Secondary | ICD-10-CM | POA: Insufficient documentation

## 2011-01-24 DIAGNOSIS — Z23 Encounter for immunization: Secondary | ICD-10-CM

## 2011-01-24 DIAGNOSIS — G35 Multiple sclerosis: Secondary | ICD-10-CM

## 2011-01-24 MED ORDER — BACLOFEN 20 MG PO TABS: 20.0000 mg | ORAL_TABLET | Freq: Four times a day (QID) | ORAL | Status: DC | PRN

## 2011-01-24 NOTE — Progress Notes (Signed)
  Subjective:    Patient ID: [name], male    DOB: Dec 21, 1955, 55 y.o.   MRN: [id number]  HPI  The patient is here to follow up on chronic MS - worse, planning to go on disability  In 6 mo. C/o weakness, spasms, headaches and chronic moderate gait imbalance symptoms not controlled with medicines and exercise. C/o R hernia.    Review of Systems  Constitutional: Negative for appetite change, fatigue and unexpected weight change.  HENT: Negative for nosebleeds, congestion, sore throat, sneezing, trouble swallowing and neck pain.   Eyes: Negative for itching and visual disturbance.  Respiratory: Negative for cough.   Cardiovascular: Negative for chest pain, palpitations and leg swelling.  Gastrointestinal: Negative for nausea, diarrhea, blood in stool and abdominal distention.  Genitourinary: Negative for frequency and hematuria.  Musculoskeletal: Positive for back pain, arthralgias and gait problem. Negative for joint swelling.  Skin: Negative for rash.  Neurological: Negative for dizziness, tremors, speech difficulty and weakness.  Psychiatric/Behavioral: Positive for sleep disturbance. Negative for dysphoric mood and agitation. The patient is not nervous/anxious.        Objective:   Physical Exam  Constitutional: He is oriented to person, place, and time. He appears well-developed.  HENT:  Mouth/Throat: Oropharynx is clear and moist.  Eyes: Conjunctivae are normal. Pupils are equal, round, and reactive to light.  Neck: Normal range of motion. No JVD present. No thyromegaly present.  Cardiovascular: Normal rate, regular rhythm, normal heart sounds and intact distal pulses.  Exam reveals no gallop and no friction rub.   No murmur heard. Pulmonary/Chest: Effort normal and breath sounds normal. No respiratory distress. He has no wheezes. He has no rales. He exhibits no tenderness.  Abdominal: Soft. Bowel sounds are normal. He exhibits mass (L inguinal hernia). He exhibits no  distension. There is no tenderness. There is no rebound and no guarding.  Musculoskeletal: Normal range of motion. He exhibits tenderness (LS). He exhibits no edema.  Lymphadenopathy:    He has no cervical adenopathy.  Neurological: He is alert and oriented to person, place, and time. He displays abnormal reflex. No cranial nerve deficit. He exhibits abnormal muscle tone (hyperrefexia). Coordination abnormal.  Skin: Skin is warm and dry. Rash (mild SD) noted.  Psychiatric: He has a normal mood and affect. His behavior is normal. Judgment and thought content normal.  L 5 cm ing hernia       Assessment & Plan:

## 2011-01-24 NOTE — Assessment & Plan Note (Signed)
Continue with current prescription therapy as reflected on the Med list.  

## 2011-01-24 NOTE — Assessment & Plan Note (Signed)
Dr Ingram 

## 2011-01-24 NOTE — Assessment & Plan Note (Signed)
Cane

## 2011-01-24 NOTE — Patient Instructions (Signed)
Duragesic patch - we can try

## 2011-01-24 NOTE — Assessment & Plan Note (Addendum)
Continue with current prescription therapy as reflected on the Med list. Mobic. Neurontin. Tramadol

## 2011-02-16 ENCOUNTER — Encounter (INDEPENDENT_AMBULATORY_CARE_PROVIDER_SITE_OTHER): Payer: Self-pay | Admitting: General Surgery

## 2011-02-16 ENCOUNTER — Ambulatory Visit (INDEPENDENT_AMBULATORY_CARE_PROVIDER_SITE_OTHER): Payer: Managed Care, Other (non HMO) | Admitting: General Surgery

## 2011-02-16 VITALS — BP 132/90 | HR 68 | Temp 98.2°F | Resp 12 | Ht 67.0 in | Wt 152.4 lb

## 2011-02-16 DIAGNOSIS — K4090 Unilateral inguinal hernia, without obstruction or gangrene, not specified as recurrent: Secondary | ICD-10-CM

## 2011-02-16 NOTE — Progress Notes (Signed)
Patient ID: [name], male   DOB: [birth year], 56 y.o.   MRN: [id number]  Chief Complaint  Patient presents with  . Other    est pt new prob- eval hernia    HPI [name] is a 56 y.o. male.  He is referred to me by Dr. Posey Rea for evaluation of a symptomatic left inguinal hernia.  This patient has multiple sclerosis and ambulates with a cane. 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 He has chronic back pain.  He had a right inguinal hernia repair surgery years ago and has done well with that. He had a cholecystectomy by [facility] surgery one year ago and has done well with that. He now reports a one year history of a painful burning sensation in his left groin he can feel a bulge which is reducible. He would like something done about this. HPI  Past Medical History  Diagnosis Date  . Vitamin D deficiency   . Low back pain   . MS (multiple sclerosis)   . Pancreatitis hosp[italized with gallstone pancreatitis 3/12[  . Gallstone pancreatitis   . History of inguinal hernia repair   . Hernia     Past Surgical History  Procedure Date  . Tonsillectomy   . Hernia repair   . Knee arthroscopy     right  . Nose surgery   . Cystectomy     on back and R hand  . Wisdom tooth extraction   . Cholecystectomy 04/2010    Family History  Problem Relation Age of Onset  . Cancer Father     ?  . Diabetes Mother     Social History History  Substance Use Topics  . Smoking status: Never Smoker   . Smokeless tobacco: Never Used  . Alcohol Use: No    No Known Allergies  Current Outpatient Prescriptions  Medication Sig Dispense Refill  . baclofen (LIORESAL) 20 MG tablet Take 1 tablet (20 mg total) by mouth 4 (four) times daily as needed.  120 each  5  . cetirizine (ZYRTEC) 10 MG tablet Take 10 mg by mouth daily.        . cholecalciferol (VITAMIN D) 1000 UNITS tablet Take 1,000 Units by mouth daily.        . [employer] VITAMIN D3 1000 UNITS capsule TAKE  ONE CAPSULE BY MOUTH TWICE DAILY  200 capsule  2  . fluticasone (FLONASE) 50 MCG/ACT nasal spray 2 sprays by Nasal route daily.        [name] gabapentin (NEURONTIN) 100 MG capsule Take 2 capsules (200 mg total) by mouth 3 (three) times daily.  180 capsule  11  . meloxicam (MOBIC) 7.5 MG tablet Take 1 tablet (7.5 mg total) by mouth 2 (two) times daily.  60 tablet  11  . Multiple Vitamin (MULTIVITAMIN) capsule Take 1 capsule by mouth daily.        . pantoprazole (PROTONIX) 40 MG tablet Take 1 tablet (40 mg total) by mouth daily.  90 tablet  3  . Silodosin (RAPAFLO) 8 MG CAPS Take by mouth.        . traMADol (ULTRAM-ER) 100 MG 24 hr tablet Take 1-2 tablets (100-200 mg total) by mouth daily as needed for pain.  180 tablet  1    Review of Systems Review of Systems  Constitutional: Negative for fever, chills and unexpected weight change.  HENT: Positive for voice change. Negative for hearing loss, congestion, sore throat and trouble  swallowing.   Eyes: Negative for visual disturbance.  Respiratory: Negative for cough and wheezing.   Cardiovascular: Negative for chest pain, palpitations and leg swelling.  Gastrointestinal: Negative for nausea, vomiting, abdominal pain, diarrhea, constipation, blood in stool, abdominal distention, anal bleeding and rectal pain.  Genitourinary: Negative for hematuria and difficulty urinating.  Musculoskeletal: Positive for myalgias, back pain and gait problem. Negative for arthralgias.  Skin: Negative for rash and wound.  Neurological: Positive for weakness. Negative for seizures, syncope and headaches.  Hematological: Negative for adenopathy. Does not bruise/bleed easily.  Psychiatric/Behavioral: Negative for confusion.    Blood pressure 132/90, pulse 68, temperature 98.2 F (36.8 C), temperature source Temporal, resp. rate 12, height 5\' 7"  (1.702 m), weight 152 lb 6.4 oz (69.128 kg).  Physical Exam Physical Exam  Constitutional: He is oriented to person, place,  and time. He appears well-developed and well-nourished. No distress.  HENT:  Head: Normocephalic.  Nose: Nose normal.  Mouth/Throat: No oropharyngeal exudate.  Eyes: Conjunctivae and EOM are normal. Pupils are equal, round, and reactive to light. Right eye exhibits no discharge. Left eye exhibits no discharge. No scleral icterus.  Neck: Normal range of motion. Neck supple. No JVD present. No tracheal deviation present. No thyromegaly present.  Cardiovascular: Normal rate, regular rhythm, normal heart sounds and intact distal pulses.   No murmur heard. Pulmonary/Chest: Effort normal and breath sounds normal. No stridor. No respiratory distress. He has no wheezes. He has no rales. He exhibits no tenderness.  Abdominal: Soft. Bowel sounds are normal. He exhibits no distension and no mass. There is no tenderness. There is no rebound and no guarding.    Genitourinary:     Musculoskeletal: Normal range of motion. He exhibits no edema and no tenderness.  Lymphadenopathy:    He has no cervical adenopathy.  Neurological: He is alert and oriented to person, place, and time. He has normal reflexes. He exhibits abnormal muscle tone. Coordination abnormal.       Able to stand and ambulate. Obvious balance problem. No tremor..  Skin: Skin is warm and dry. No rash noted. He is not diaphoretic. No erythema. No pallor.  Psychiatric: He has a normal mood and affect. His behavior is normal. Judgment and thought content normal.    Data Reviewed  I reviewed Dr. Loren Racer office notes. Assessment    Reducible left inguinal hernia, symptomatic.  History right inguinal hernia repair without complications.  Multiple sclerosis with disability.  Status post laparoscopic cholecystectomy, postop ERCP and stone removal  Chronic back pain    Plan    We'll schedule for elective repair of his left inguinal hernia with mesh. We will do this as an open technique.  I have discussed the indications and  details of surgery with him. Risks and complications have been outlined, including but not limited to bleeding, infection, recurrence of the hernia, nerve damage with chronic pain or numbness, injury to the intestine or bladder or testicle, and other on proceeding problems. He understands all these issues. His questions were answered. He agrees with this plan.     Angelia Mould. Derrell Lolling, M.D., [facility] General and Minimally invasive Surgery Breast and Colorectal Surgery Office:   [phone] Pager:   [phone]    02/16/2011, 9:16 AM

## 2011-02-16 NOTE — Patient Instructions (Signed)
You will be scheduled for elective surgery to repair your left inguinal hernia with mesh.  Inguinal Hernia, Adult Muscles help keep everything in the body in its proper place. But if a weak spot in the muscles develops, something can poke through. That is called a hernia. When this happens in the lower part of the belly (abdomen), it is called an inguinal hernia. (It takes its name from a part of the body in this region called the inguinal canal.) A weak spot in the wall of muscles lets some fat or part of the small intestine bulge through. An inguinal hernia can develop at any age. Men get them more often than women. CAUSES  In adults, an inguinal hernia develops over time.  It can be triggered by:   Suddenly straining the muscles of the lower abdomen.   Lifting heavy objects.   Straining to have a bowel movement. Difficult bowel movements (constipation) can lead to this.   Constant coughing. This may be caused by smoking or lung disease.   Being overweight.   Being pregnant.   Working at a job that requires long periods of standing or heavy lifting.   Having had an inguinal hernia before.  One type can be an emergency situation. It is called a strangulated inguinal hernia. It develops if part of the small intestine slips through the weak spot and cannot get back into the abdomen. The blood supply can be cut off. If that happens, part of the intestine may die. This situation requires emergency surgery. SYMPTOMS  Often, a small inguinal hernia has no symptoms. It is found when a healthcare provider does a physical exam. Larger hernias usually have symptoms.   In adults, symptoms may include:   A lump in the groin. This is easier to see when the person is standing. It might disappear when lying down.   In men, a lump in the scrotum.   Pain or burning in the groin. This occurs especially when lifting, straining or coughing.   A dull ache or feeling of pressure in the groin.    Signs of a strangulated hernia can include:   A bulge in the groin that becomes very painful and tender to the touch.   A bulge that turns red or purple.   Fever, nausea and vomiting.   Inability to have a bowel movement or to pass gas.  DIAGNOSIS  To decide if you have an inguinal hernia, a healthcare provider will probably do a physical examination.  This will include asking questions about any symptoms you have noticed.   The healthcare provider might feel the groin area and ask you to cough. If an inguinal hernia is felt, the healthcare provider may try to slide it back into the abdomen.   Usually no other tests are needed.  TREATMENT  Treatments can vary. The size of the hernia makes a difference. Options include:  Watchful waiting. This is often suggested if the hernia is small and you have had no symptoms.   No medical procedure will be done unless symptoms develop.   You will need to watch closely for symptoms. If any occur, contact your healthcare provider right away.   Surgery. This is used if the hernia is larger or you have symptoms.   Open surgery. This is usually an outpatient procedure (you will not stay overnight in a hospital). An cut (incision) is made through the skin in the groin. The hernia is put back inside the abdomen. The weak  area in the muscles is then repaired by herniorrhaphy or hernioplasty. Herniorrhaphy: in this type of surgery, the weak muscles are sewn back together. Hernioplasty: a patch or mesh is used to close the weak area in the abdominal wall.   Laparoscopy. In this procedure, a surgeon makes small incisions. A thin tube with a tiny video camera (called a laparoscope) is put into the abdomen. The surgeon repairs the hernia with mesh by looking with the video camera and using two long instruments.  HOME CARE INSTRUCTIONS   After surgery to repair an inguinal hernia:   You will need to take pain medicine prescribed by your healthcare  provider. Follow all directions carefully.   You will need to take care of the wound from the incision.   Your activity will be restricted for awhile. This will probably include no heavy lifting for several weeks. You also should not do anything too active for a few weeks. When you can return to work will depend on the type of job that you have.   During "watchful waiting" periods, you should:   Maintain a healthy weight.   Eat a diet high in fiber (fruits, vegetables and whole grains).   Drink plenty of fluids to avoid constipation. This means drinking enough water and other liquids to keep your urine clear or pale yellow.   Do not lift heavy objects.   Do not stand for long periods of time.   Quit smoking. This should keep you from developing a frequent cough.  SEEK MEDICAL CARE IF:   A bulge develops in your groin area.   You feel pain, a burning sensation or pressure in the groin. This might be worse if you are lifting or straining.   You develop a fever of more than 100.5 F (38.1 C).  SEEK IMMEDIATE MEDICAL CARE IF:   Pain in the groin increases suddenly.   A bulge in the groin gets bigger suddenly and does not go down.   For men, there is sudden pain in the scrotum. Or, the size of the scrotum increases.   A bulge in the groin area becomes red or purple and is painful to touch.   You have nausea or vomiting that does not go away.   You feel your heart beating much faster than normal.   You cannot have a bowel movement or pass gas.   You develop a fever of more than 102.0 F (38.9 C).  Document Released: 06/11/2008 Document Revised: 10/05/2010 Document Reviewed: 06/11/2008 [facility] Patient Information 2012 [place], [place].

## 2011-03-10 ENCOUNTER — Encounter (HOSPITAL_BASED_OUTPATIENT_CLINIC_OR_DEPARTMENT_OTHER): Payer: Self-pay | Admitting: *Deleted

## 2011-03-10 NOTE — Progress Notes (Signed)
To come in for labs-had ekg 6/12 Pt has MS

## 2011-03-14 ENCOUNTER — Telehealth: Payer: Self-pay | Admitting: *Deleted

## 2011-03-14 NOTE — Telephone Encounter (Signed)
Rf req for Tramadol ER 100 mg 1-2 po qd prn pain. # 180. Last filled 1.6.13. Ok to Rf?

## 2011-03-14 NOTE — Telephone Encounter (Signed)
OK to fill this prescription with additional refills x3 Thank you!  

## 2011-03-15 ENCOUNTER — Encounter (HOSPITAL_BASED_OUTPATIENT_CLINIC_OR_DEPARTMENT_OTHER)
Admission: RE | Admit: 2011-03-15 | Discharge: 2011-03-15 | Disposition: A | Discharge status: Home / Self Care | Payer: Managed Care, Other (non HMO) | Source: Ambulatory Visit | Attending: General Surgery | Admitting: General Surgery

## 2011-03-15 LAB — DIFFERENTIAL
Basophils Relative: 1 % (ref 0–1)
Eosinophils Relative: 2 % (ref 0–5)
Lymphs Abs: 1.8 10*3/uL (ref 0.7–4.0)
Monocytes Relative: 10 % (ref 3–12)
Neutro Abs: 3.9 10*3/uL (ref 1.7–7.7)

## 2011-03-15 LAB — CBC
MCH: 31.8 pg (ref 26.0–34.0)
Platelets: ADEQUATE 10*3/uL (ref 150–400)
RBC: 5.07 MIL/uL (ref 4.22–5.81)
RDW: 12.2 % (ref 11.5–15.5)

## 2011-03-15 LAB — COMPREHENSIVE METABOLIC PANEL
ALT: 16 U/L (ref 0–53)
AST: 16 U/L (ref 0–37)
Albumin: 4.2 g/dL (ref 3.5–5.2)
CO2: 32 mEq/L (ref 19–32)
Calcium: 9.4 mg/dL (ref 8.4–10.5)
GFR calc non Af Amer: >90 mL/min (ref >90)
Sodium: 141 mEq/L (ref 135–145)
Total Protein: 7.3 g/dL (ref 6.0–8.3)

## 2011-03-15 MED ORDER — TRAMADOL HCL ER 100 MG PO TB24: 100.0000 mg | ORAL_TABLET | Freq: Every day | ORAL | Status: DC | PRN

## 2011-03-16 NOTE — H&P (Signed)
[name]     MRN: [id number]   Description: 56 year old male  Provider: [name], MD  Department: [facility]      Diagnoses     Left inguinal hernia   - Primary    550.90      Vitals - Last Recorded       BP Pulse Temp(Src) Resp Ht Wt    132/90  68  98.2 F (36.8 C) (Temporal)  12  5\' 7"  (1.702 m)  152 lb 6.4 oz (69.128 kg)       BMI   23.87 kg/m2                History and Physical   [name], MD  Patient ID: [name], male   DOB: 02-Dec-1955, 56 y.o.   MRN: [id number]    Chief Complaint   Patient presents with   .  Other       est pt new prob- eval hernia      HPI [name] is a 56 y.o. male.  He is referred to me by Dr. Posey Rea for evaluation of a symptomatic left inguinal hernia.  This patient has multiple sclerosis and ambulates with a cane. 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 He has chronic back pain.  He had a right inguinal hernia repair surgery years ago and has done well with that. He had a cholecystectomy by [facility] surgery one year ago and has done well with that. He now reports a one year history of a painful burning sensation in his left groin he can feel a bulge which is reducible. He would like something done about this. HPI    Past Medical History   Diagnosis  Date   .  Vitamin D deficiency     .  Low back pain     .  MS (multiple sclerosis)     .  Pancreatitis  hosp[italized with gallstone pancreatitis 3/12[   .  Gallstone pancreatitis     .  History of inguinal hernia repair     .  Hernia         Past Surgical History   Procedure  Date   .  Tonsillectomy     .  Hernia repair     .  Knee arthroscopy         right   .  Nose surgery     .  Cystectomy         on back and R hand   .  Wisdom tooth extraction     .  Cholecystectomy  04/2010       Family History   Problem  Relation  Age of Onset   .  Cancer  Father         ?   .  Diabetes  Mother      Social  History History   Substance Use Topics   .  Smoking status:  Never Smoker    .  Smokeless tobacco:  Never Used   .  Alcohol Use:  No      No Known Allergies    Current Outpatient Prescriptions   Medication  Sig  Dispense  Refill   .  baclofen (LIORESAL) 20 MG tablet  Take 1 tablet (20 mg total) by mouth 4 (four) times daily as needed.   120 each   5   .  cetirizine (ZYRTEC)  10 MG tablet  Take 10 mg by mouth daily.           .  cholecalciferol (VITAMIN D) 1000 UNITS tablet  Take 1,000 Units by mouth daily.           .  [employer] VITAMIN D3 1000 UNITS capsule  TAKE ONE CAPSULE BY MOUTH TWICE DAILY   200 capsule   2   .  fluticasone (FLONASE) 50 MCG/ACT nasal spray  2 sprays by Nasal route daily.           [name]  gabapentin (NEURONTIN) 100 MG capsule  Take 2 capsules (200 mg total) by mouth 3 (three) times daily.   180 capsule   11   .  meloxicam (MOBIC) 7.5 MG tablet  Take 1 tablet (7.5 mg total) by mouth 2 (two) times daily.   60 tablet   11   .  Multiple Vitamin (MULTIVITAMIN) capsule  Take 1 capsule by mouth daily.           .  pantoprazole (PROTONIX) 40 MG tablet  Take 1 tablet (40 mg total) by mouth daily.   90 tablet   3   .  Silodosin (RAPAFLO) 8 MG CAPS  Take by mouth.           .  traMADol (ULTRAM-ER) 100 MG 24 hr tablet  Take 1-2 tablets (100-200 mg total) by mouth daily as needed for pain.   180 tablet   1      Review of Systems Review of Systems  Constitutional: Negative for fever, chills and unexpected weight change.  HENT: Positive for voice change. Negative for hearing loss, congestion, sore throat and trouble swallowing.   Eyes: Negative for visual disturbance.  Respiratory: Negative for cough and wheezing.   Cardiovascular: Negative for chest pain, palpitations and leg swelling.  Gastrointestinal: Negative for nausea, vomiting, abdominal pain, diarrhea, constipation, blood in stool, abdominal distention, anal bleeding and rectal pain.  Genitourinary: Negative for hematuria and  difficulty urinating.  Musculoskeletal: Positive for myalgias, back pain and gait problem. Negative for arthralgias.  Skin: Negative for rash and wound.  Neurological: Positive for weakness. Negative for seizures, syncope and headaches.  Hematological: Negative for adenopathy. Does not bruise/bleed easily.  Psychiatric/Behavioral: Negative for confusion.    Blood pressure 132/90, pulse 68, temperature 98.2 F (36.8 C), temperature source Temporal, resp. rate 12, height 5\' 7"  (1.702 m), weight 152 lb 6.4 oz (69.128 kg).   Physical Exam   Constitutional: He is oriented to person, place, and time. He appears well-developed and well-nourished. No distress.  HENT:   Head: Normocephalic.   Nose: Nose normal.   Mouth/Throat: No oropharyngeal exudate.  Eyes: Conjunctivae and EOM are normal. Pupils are equal, round, and reactive to light. Right eye exhibits no discharge. Left eye exhibits no discharge. No scleral icterus.  Neck: Normal range of motion. Neck supple. No JVD present. No tracheal deviation present. No thyromegaly present.  Cardiovascular: Normal rate, regular rhythm, normal heart sounds and intact distal pulses.    No murmur heard. Pulmonary/Chest: Effort normal and breath sounds normal. No stridor. No respiratory distress. He has no wheezes. He has no rales. He exhibits no tenderness.  Abdominal: Soft. Bowel sounds are normal. He exhibits no distension and no mass. There is no tenderness. There is no rebound and no guarding.  Genitourinary: Old laparoscopy scars. Reducible LIH. Old scar right groin, no recurrenc  Musculoskeletal: Normal range of motion. He exhibits no edema and no tenderness.  Lymphadenopathy:    He has no cervical adenopathy.  Neurological: He is alert and oriented to person, place, and time. He has normal reflexes. He exhibits abnormal muscle tone. Coordination abnormal.       Able to stand and ambulate. Obvious balance problem. No tremor..  Skin: Skin is warm  and dry. No rash noted. He is not diaphoretic. No erythema. No pallor.  Psychiatric: He has a normal mood and affect. His behavior is normal. Judgment and thought content normal.    Data Reviewed I reviewed Dr. Loren Racer office notes.  Assessment ssmentReducible left inguinal hernia, symptomatic.  History right inguinal hernia repair without complications.  Multiple sclerosis with disability.  Status post laparoscopic cholecystectomy, postop ERCP and stone removal  Chronic back pain   Plan We'll schedule for elective repair of his left inguinal hernia with mesh. We will do this as an open technique.  I have discussed the indications and details of surgery with him. Risks and complications have been outlined, including but not limited to bleeding, infection, recurrence of the hernia, nerve damage with chronic pain or numbness, injury to the intestine or bladder or testicle, and other on proceeding problems. He understands all these issues. His questions were answered. He agrees with this plan.   Angelia Mould. Derrell Lolling, M.D., [facility] General and Minimally invasive Surgery Breast and Colorectal Surgery Office:   [phone] Pager:   [phone] 03/16/2011

## 2011-03-17 ENCOUNTER — Encounter (HOSPITAL_BASED_OUTPATIENT_CLINIC_OR_DEPARTMENT_OTHER): Payer: Self-pay | Admitting: Certified Registered"

## 2011-03-17 ENCOUNTER — Encounter (HOSPITAL_BASED_OUTPATIENT_CLINIC_OR_DEPARTMENT_OTHER): Payer: Self-pay | Admitting: Anesthesiology

## 2011-03-17 ENCOUNTER — Ambulatory Visit (HOSPITAL_BASED_OUTPATIENT_CLINIC_OR_DEPARTMENT_OTHER)
Admission: RE | Admit: 2011-03-17 | Discharge: 2011-03-17 | Disposition: A | Discharge status: Home / Self Care | Payer: Managed Care, Other (non HMO) | Source: Ambulatory Visit | Attending: General Surgery | Admitting: General Surgery

## 2011-03-17 ENCOUNTER — Encounter (HOSPITAL_BASED_OUTPATIENT_CLINIC_OR_DEPARTMENT_OTHER)
Admission: RE | Disposition: A | Discharge status: Home / Self Care | Payer: Self-pay | Source: Ambulatory Visit | Attending: General Surgery

## 2011-03-17 ENCOUNTER — Ambulatory Visit (HOSPITAL_BASED_OUTPATIENT_CLINIC_OR_DEPARTMENT_OTHER): Payer: Managed Care, Other (non HMO) | Admitting: Anesthesiology

## 2011-03-17 DIAGNOSIS — G35 Multiple sclerosis: Secondary | ICD-10-CM | POA: Insufficient documentation

## 2011-03-17 DIAGNOSIS — K4090 Unilateral inguinal hernia, without obstruction or gangrene, not specified as recurrent: Secondary | ICD-10-CM

## 2011-03-17 DIAGNOSIS — J45909 Unspecified asthma, uncomplicated: Secondary | ICD-10-CM | POA: Insufficient documentation

## 2011-03-17 DIAGNOSIS — R51 Headache: Secondary | ICD-10-CM | POA: Insufficient documentation

## 2011-03-17 DIAGNOSIS — E559 Vitamin D deficiency, unspecified: Secondary | ICD-10-CM | POA: Insufficient documentation

## 2011-03-17 DIAGNOSIS — Z01812 Encounter for preprocedural laboratory examination: Secondary | ICD-10-CM | POA: Insufficient documentation

## 2011-03-17 HISTORY — DX: Unspecified osteoarthritis, unspecified site: M19.90

## 2011-03-17 HISTORY — PX: INGUINAL HERNIA REPAIR: SHX194

## 2011-03-17 SURGERY — REPAIR, HERNIA, INGUINAL, ADULT
Anesthesia: General | Site: Abdomen | Laterality: Left | Wound class: Clean

## 2011-03-17 MED ORDER — HYDROCODONE-ACETAMINOPHEN 5-325 MG PO TABS
1.0000 | ORAL_TABLET | ORAL | Status: AC | PRN
Start: 2011-03-17 — End: 2011-03-27

## 2011-03-17 MED ORDER — GLYCOPYRROLATE 0.2 MG/ML IJ SOLN
INTRAMUSCULAR | Status: DC | PRN
  Administered 2011-03-17: 0.2 mg via INTRAVENOUS

## 2011-03-17 MED ORDER — ACETAMINOPHEN 10 MG/ML IV SOLN
1000.0000 mg | Freq: Once | INTRAVENOUS | Status: AC
  Administered 2011-03-17: 1000 mg via INTRAVENOUS

## 2011-03-17 MED ORDER — FENTANYL CITRATE 0.05 MG/ML IJ SOLN
INTRAMUSCULAR | Status: DC | PRN
  Administered 2011-03-17: 50 ug via INTRAVENOUS

## 2011-03-17 MED ORDER — ONDANSETRON HCL 4 MG/2ML IJ SOLN
INTRAMUSCULAR | Status: DC | PRN
  Administered 2011-03-17: 4 mg via INTRAVENOUS

## 2011-03-17 MED ORDER — ACETAMINOPHEN 650 MG RE SUPP: 650.0000 mg | RECTAL | Status: DC | PRN

## 2011-03-17 MED ORDER — ACETAMINOPHEN 325 MG PO TABS: 650.0000 mg | ORAL_TABLET | ORAL | Status: DC | PRN

## 2011-03-17 MED ORDER — METOCLOPRAMIDE HCL 5 MG/ML IJ SOLN: 10.0000 mg | Freq: Once | INTRAMUSCULAR | Status: DC | PRN

## 2011-03-17 MED ORDER — MIDAZOLAM HCL 2 MG/2ML IJ SOLN
0.5000 mg | INTRAMUSCULAR | Status: DC | PRN
  Administered 2011-03-17: 2 mg via INTRAVENOUS

## 2011-03-17 MED ORDER — FENTANYL CITRATE 0.05 MG/ML IJ SOLN
50.0000 ug | INTRAMUSCULAR | Status: DC | PRN
  Administered 2011-03-17: 100 ug via INTRAVENOUS

## 2011-03-17 MED ORDER — BUPIVACAINE HCL (PF) 0.25 % IJ SOLN
INTRAMUSCULAR | Status: DC | PRN
  Administered 2011-03-17: 17 mL

## 2011-03-17 MED ORDER — OXYCODONE HCL 5 MG PO TABS: 5.0000 mg | ORAL_TABLET | ORAL | Status: DC | PRN

## 2011-03-17 MED ORDER — SODIUM CHLORIDE 0.9 % IV SOLN: 250.0000 mL | INTRAVENOUS | Status: DC | PRN

## 2011-03-17 MED ORDER — DROPERIDOL 2.5 MG/ML IJ SOLN
INTRAMUSCULAR | Status: DC | PRN
  Administered 2011-03-17: 0.625 mg via INTRAVENOUS

## 2011-03-17 MED ORDER — SODIUM CHLORIDE 0.9 % IJ SOLN: 3.0000 mL | INTRAMUSCULAR | Status: DC | PRN

## 2011-03-17 MED ORDER — CEFAZOLIN SODIUM 1-5 GM-% IV SOLN: 1.0000 g | INTRAVENOUS | Status: DC

## 2011-03-17 MED ORDER — BUPIVACAINE HCL (PF) 0.5 % IJ SOLN
INTRAMUSCULAR | Status: DC | PRN
  Administered 2011-03-17: 20 mL

## 2011-03-17 MED ORDER — PROMETHAZINE HCL 25 MG/ML IJ SOLN: 12.5000 mg | Freq: Four times a day (QID) | INTRAMUSCULAR | Status: DC | PRN

## 2011-03-17 MED ORDER — ONDANSETRON HCL 4 MG/2ML IJ SOLN: 4.0000 mg | Freq: Four times a day (QID) | INTRAMUSCULAR | Status: DC | PRN

## 2011-03-17 MED ORDER — DEXAMETHASONE SODIUM PHOSPHATE 4 MG/ML IJ SOLN
INTRAMUSCULAR | Status: DC | PRN
  Administered 2011-03-17: 4 mg via INTRAVENOUS

## 2011-03-17 MED ORDER — PROPOFOL 10 MG/ML IV EMUL
INTRAVENOUS | Status: DC | PRN
  Administered 2011-03-17: 20 mg via INTRAVENOUS
  Administered 2011-03-17: 230 mg via INTRAVENOUS

## 2011-03-17 MED ORDER — LACTATED RINGERS IV SOLN
INTRAVENOUS | Status: DC
  Administered 2011-03-17 (×2): via INTRAVENOUS

## 2011-03-17 MED ORDER — HEPARIN SODIUM (PORCINE) 5000 UNIT/ML IJ SOLN
5000.0000 [IU] | Freq: Once | INTRAMUSCULAR | Status: AC
  Administered 2011-03-17: 5000 [IU] via SUBCUTANEOUS

## 2011-03-17 MED ORDER — FENTANYL CITRATE 0.05 MG/ML IJ SOLN: 25.0000 ug | INTRAMUSCULAR | Status: DC | PRN

## 2011-03-17 MED ORDER — SODIUM CHLORIDE 0.9 % IJ SOLN: 3.0000 mL | Freq: Two times a day (BID) | INTRAMUSCULAR | Status: DC

## 2011-03-17 MED ORDER — CHLORHEXIDINE GLUCONATE 4 % EX LIQD: 1.0000 "application " | Freq: Once | CUTANEOUS | Status: DC

## 2011-03-17 MED ORDER — MORPHINE SULFATE 10 MG/ML IJ SOLN: 0.0500 mg/kg | INTRAMUSCULAR | Status: DC | PRN

## 2011-03-17 MED ORDER — SODIUM CHLORIDE 0.9 % IV SOLN: INTRAVENOUS | Status: DC

## 2011-03-17 SURGICAL SUPPLY — 61 items
ADH SKN CLS APL DERMABOND .7 (GAUZE/BANDAGES/DRESSINGS) ×2
APL SKNCLS STERI-STRIP NONHPOA (GAUZE/BANDAGES/DRESSINGS)
BENZOIN TINCTURE PRP APPL 2/3 (GAUZE/BANDAGES/DRESSINGS) IMPLANT
BLADE HEX COATED 2.75 (ELECTRODE) ×2 IMPLANT
BLADE SURG 10 STRL SS (BLADE) ×2 IMPLANT
BLADE SURG ROTATE 9660 (MISCELLANEOUS) ×1 IMPLANT
CANISTER SUCTION 1200CC (MISCELLANEOUS) ×1 IMPLANT
CLOTH BEACON ORANGE TIMEOUT ST (SAFETY) ×2 IMPLANT
COVER MAYO STAND STRL (DRAPES) ×2 IMPLANT
COVER TABLE BACK 60X90 (DRAPES) ×2 IMPLANT
DECANTER SPIKE VIAL GLASS SM (MISCELLANEOUS) IMPLANT
DERMABOND ADVANCED (GAUZE/BANDAGES/DRESSINGS) ×2
DERMABOND ADVANCED .7 DNX12 (GAUZE/BANDAGES/DRESSINGS) IMPLANT
DRAIN PENROSE 1/2X12 LTX STRL (WOUND CARE) ×2 IMPLANT
DRAPE LAPAROTOMY TRNSV 102X78 (DRAPE) IMPLANT
DRAPE PED LAPAROTOMY (DRAPES) ×2 IMPLANT
DRAPE UTILITY XL STRL (DRAPES) ×2 IMPLANT
ELECT REM PT RETURN 9FT ADLT (ELECTROSURGICAL) ×2
ELECTRODE REM PT RTRN 9FT ADLT (ELECTROSURGICAL) ×1 IMPLANT
GAUZE SPONGE 4X4 12PLY STRL LF (GAUZE/BANDAGES/DRESSINGS) IMPLANT
GLOVE BIO SURGEON STRL SZ 6 (GLOVE) ×2 IMPLANT
GLOVE BIOGEL M 7.0 STRL (GLOVE) ×1 IMPLANT
GLOVE BIOGEL PI IND STRL 7.5 (GLOVE) IMPLANT
GLOVE BIOGEL PI INDICATOR 7.5 (GLOVE) ×1
GLOVE ECLIPSE 6.5 STRL STRAW (GLOVE) ×1 IMPLANT
GLOVE EUDERMIC 7 POWDERFREE (GLOVE) ×2 IMPLANT
GOWN PREVENTION PLUS XLARGE (GOWN DISPOSABLE) ×1 IMPLANT
GOWN PREVENTION PLUS XXLARGE (GOWN DISPOSABLE) ×4 IMPLANT
MESH ULTRAPRO 3X6 7.6X15CM (Mesh General) ×1 IMPLANT
NDL HYPO 25X1 1.5 SAFETY (NEEDLE) ×1 IMPLANT
NEEDLE HYPO 25X1 1.5 SAFETY (NEEDLE) ×4 IMPLANT
NS IRRIG 1000ML POUR BTL (IV SOLUTION) ×2 IMPLANT
PACK BASIN DAY SURGERY FS (CUSTOM PROCEDURE TRAY) ×2 IMPLANT
PENCIL BUTTON HOLSTER BLD 10FT (ELECTRODE) ×2 IMPLANT
SLEEVE SCD COMPRESS KNEE MED (MISCELLANEOUS) ×1 IMPLANT
SPONGE LAP 4X18 X RAY DECT (DISPOSABLE) ×1 IMPLANT
STAPLER VISISTAT 35W (STAPLE) IMPLANT
STRIP CLOSURE SKIN 1/2X4 (GAUZE/BANDAGES/DRESSINGS) IMPLANT
SUT MNCRL AB 4-0 PS2 18 (SUTURE) ×2 IMPLANT
SUT PROLENE 1 CT (SUTURE) IMPLANT
SUT PROLENE 2 0 CT2 30 (SUTURE) ×5 IMPLANT
SUT SILK 2 0 SH (SUTURE) IMPLANT
SUT SILK 2 0 TIES 17X18 (SUTURE) ×2
SUT SILK 2-0 18XBRD TIE BLK (SUTURE) ×1 IMPLANT
SUT SILK 3 0 SH 30 (SUTURE) IMPLANT
SUT VIC AB 2-0 CT1 27 (SUTURE)
SUT VIC AB 2-0 CT1 TAPERPNT 27 (SUTURE) IMPLANT
SUT VIC AB 2-0 SH 27 (SUTURE) ×4
SUT VIC AB 2-0 SH 27XBRD (SUTURE) ×1 IMPLANT
SUT VIC AB 3-0 54X BRD REEL (SUTURE) IMPLANT
SUT VIC AB 3-0 BRD 54 (SUTURE)
SUT VIC AB 3-0 FS2 27 (SUTURE) IMPLANT
SUT VIC AB 3-0 SH 27 (SUTURE) ×2
SUT VIC AB 3-0 SH 27X BRD (SUTURE) ×1 IMPLANT
SUT VICRYL 3-0 CR8 SH (SUTURE) IMPLANT
SYR CONTROL 10ML LL (SYRINGE) ×2 IMPLANT
TOWEL OR NON WOVEN STRL DISP B (DISPOSABLE) ×2 IMPLANT
TRAY DSU PREP LF (CUSTOM PROCEDURE TRAY) ×2 IMPLANT
TUBE CONNECTING 20X1/4 (TUBING) ×1 IMPLANT
WATER STERILE IRR 1000ML POUR (IV SOLUTION) ×1 IMPLANT
YANKAUER SUCT BULB TIP NO VENT (SUCTIONS) ×1 IMPLANT

## 2011-03-17 NOTE — Transfer of Care (Signed)
Immediate Anesthesia Transfer of Care Note  Patient: [name]  Procedure(s) Performed:  HERNIA REPAIR INGUINAL ADULT - Open left inguinal hernia repair with mesh  Patient Location: PACU  Anesthesia Type: GA combined with regional for post-op pain  Level of Consciousness: sedated and unresponsive  Airway & Oxygen Therapy: Patient Spontanous Breathing and Patient connected to face mask oxygen  Post-op Assessment: Report given to PACU RN and Post -op Vital signs reviewed and stable  Post vital signs: Reviewed and stable  Complications: No apparent anesthesia complications

## 2011-03-17 NOTE — Interval H&P Note (Signed)
History and Physical Interval Note:  03/17/2011 9:35 AM  [name]  has presented today for surgery, with the diagnosis of left inguinal hernia  The goals of treatment and the various methods of treatment have been discussed with the patient and family. After consideration of risks, benefits and other options for treatment, the patient has consented to  Procedure(s): HERNIA REPAIR LEFT INGUINAL ADULT as a surgical intervention .  The patients' history has been reviewed, patient examined, no change in status, stable for surgery.  I have reviewed the patients' chart and labs.  Questions were answered to the patient's satisfaction.     [name] 03/17/2011 9:35 AM

## 2011-03-17 NOTE — Op Note (Signed)
Patient Name:           [name]   Date of Surgery:        03/17/2011  Pre op Diagnosis:      Left inguinal  hernia  Post op Diagnosis:    Direct left inguinal hernia  Procedure:                 Repair left inguinal hernia with mesh  Armanda Heritage repair)  Surgeon:                     Angelia Mould. Derrell Lolling, M.D., FACS  Assistant:                      none  Operative Indications:   This is a 56 year old man with multiple sclerosis. He had a right inguinal hernia repair many years ago and that has healed. He has a one-year history of a painful burning sensation in his left groin and he can feel a bulge in his left groin which is reducible. He has been examined in the office and found to have a medium-sized left inguinal hernia which is reducible, and no evidence of hernias elsewhere. He is brought to the operating room electively.  Operative Findings:       Reducible direct left inguinal hernia. No evidence of indirect hernia.  Procedure in Detail:          Following the induction of general LMA  anesthesia the patient's genitalia and left groin and lower abdomen were prepped and draped in a sterile fashion. Surgical time out was performed. Intravenous antibiotics were given. 0.25% Marcaine with was used as a local infiltration anesthetic. A transverse oblique incision was made in the left groin. Dissection was carried down through subcutaneous tissue exposing the aponeurosis of the external oblique. The external oblique  was incised in the direction of its fibers and was dissected away from the underlying tissues and a  self-retaining retractor was placed. The cord structures were mobilized and encircled with a Penrose drain. A direct hernia sac was dissected away from the cord structures and reduced and then oversewn with a running suture of 2-0 Vicryl.. Examination of the cord structures revealed no evidence of indirect hernia. The floor of the inguinal canal was repaired and reinforced with a 3"  x 6" piece of ultra Pro mesh, which was trimmed at the corners to accommodate the size of the wound. The mesh was sutured in place with 2-0 Prolene sutures. The mesh was sutured so as to overlap the pubic tubercle generously, along the inguinal ligament inferiorly. Medially and superiorly and superior laterally interrupted mattress sutures of 2-0 Prolene were placed. The mesh was incised laterally so as to wrap around the cord structures at the internal ring. The ilioinguinal nerve was isolated lateral to the cord structures and the internal ring clamped divided and ligated with 2-0 silk ties. The tails of the mesh were overlapped laterally and further sutures were placed. This provided very secure repair both medial and lateral to the internal ring and allowed an adequate fingertip opening for the cord structures.. The wound was irrigated with saline. Hemostasis was excellent. The external oblique was closed with a running suture of 2-0 Vicryl placing the cord structures deep to the external oblique. Scarpa's fascia was closed with 3-0 Vicryl sutures and the skin closed with a running subcuticular suture of 4-0 Monocryl and Dermabond. The patient tolerated the procedure well. There were no  complications. EBL 10 cc.  Counts correct.     Angelia Mould. Derrell Lolling, M.D., FACS General and Minimally Invasive Surgery Breast and Colorectal Surgery  03/17/2011 10:50 AM

## 2011-03-17 NOTE — Progress Notes (Signed)
Assisted Dr. Frederick with left, ultrasound guided, transabdominal plane block. Side rails up, monitors on throughout procedure. See vital signs in flow sheet. Tolerated Procedure well. 

## 2011-03-17 NOTE — Anesthesia Procedure Notes (Addendum)
Anesthesia Regional Block:  TAP block  Pre-Anesthetic Checklist: ,, timeout performed, Correct Patient, Correct Site, Correct Laterality, Correct Procedure, Correct Position, site marked, Risks and benefits discussed,  Surgical consent,  Pre-op evaluation,  At surgeon's request and post-op pain management  Laterality: Left  Prep: chloraprep       Needles:  Injection technique: Single-shot  Needle Type: Echogenic Needle     Needle Length: 9cm  Needle Gauge: 21    Additional Needles:  Procedures: ultrasound guided TAP block Narrative:  Start time: 03/17/2011 8:31 AM End time: 03/17/2011 8:38 AM Injection made incrementally with aspirations every 5 mL.  Performed by: Personally  Anesthesiologist: Aldona Lento, MD  Additional Notes: Ultrasound guidance used to: id relevant anatomy, confirm needle position, local anesthetic spread, avoidance of vascular puncture. Picture saved. No complications. Block performed personally by Janetta Hora. Gelene Mink, MD    TAP block Procedure Name: LMA Insertion Date/Time: 03/17/2011 9:51 AM Performed by: Radford Pax Pre-anesthesia Checklist: Patient identified, Emergency Drugs available, Suction available, Patient being monitored and Timeout performed Patient Re-evaluated:Patient Re-evaluated prior to inductionOxygen Delivery Method: Circle System Utilized Preoxygenation: Pre-oxygenation with 100% oxygen Intubation Type: IV induction Ventilation: Mask ventilation without difficulty LMA: LMA inserted LMA Size: 4.0 Number of attempts: 1 (atraumatic) Airway Equipment and Method: bite block (left bite gard) Placement Confirmation: positive ETCO2 Tube secured with: Tape Dental Injury: Teeth and Oropharynx as per pre-operative assessment

## 2011-03-17 NOTE — Anesthesia Postprocedure Evaluation (Signed)
Anesthesia Post Note  Patient: [name]  Procedure(s) Performed:  HERNIA REPAIR INGUINAL ADULT - Open left inguinal hernia repair with mesh  Anesthesia type: General  Patient location: PACU  Post pain: Pain level controlled  Post assessment: Patient's Cardiovascular Status Stable  Last Vitals:  Filed Vitals:   03/17/11 1205  BP: 132/86  Pulse: 70  Temp: 36.6 C  Resp: 16    Post vital signs: Reviewed and stable  Level of consciousness: alert  Complications: No apparent anesthesia complications

## 2011-03-17 NOTE — Anesthesia Preprocedure Evaluation (Signed)
Anesthesia Evaluation  Patient identified by MRN, date of birth, ID band Patient awake    Reviewed: Allergy & Precautions, H&P , NPO status , Patient's Chart, lab work & pertinent test results, reviewed documented beta blocker date and time   Airway Mallampati: II TM Distance: >3 FB Neck ROM: full    Dental   Pulmonary asthma ,          Cardiovascular neg cardio ROS     Neuro/Psych  Headaches,  Neuromuscular disease Negative Psych ROS   GI/Hepatic negative GI ROS, Neg liver ROS,   Endo/Other  Negative Endocrine ROS  Renal/GU negative Renal ROS  Genitourinary negative   Musculoskeletal   Abdominal   Peds  Hematology negative hematology ROS (+)   Anesthesia Other Findings See surgeon's H&P   Reproductive/Obstetrics negative OB ROS                           Anesthesia Physical Anesthesia Plan  ASA: III  Anesthesia Plan: General   Post-op Pain Management: MAC Combined w/ Regional for Post-op pain   Induction: Intravenous  Airway Management Planned: LMA  Additional Equipment:   Intra-op Plan:   Post-operative Plan: Extubation in OR  Informed Consent: I have reviewed the patients History and Physical, chart, labs and discussed the procedure including the risks, benefits and alternatives for the proposed anesthesia with the patient or authorized representative who has indicated his/her understanding and acceptance.     Plan Discussed with: CRNA and Surgeon  Anesthesia Plan Comments:         Anesthesia Quick Evaluation

## 2011-03-20 ENCOUNTER — Encounter (HOSPITAL_BASED_OUTPATIENT_CLINIC_OR_DEPARTMENT_OTHER): Payer: Self-pay | Admitting: General Surgery

## 2011-03-23 ENCOUNTER — Telehealth (INDEPENDENT_AMBULATORY_CARE_PROVIDER_SITE_OTHER): Payer: Self-pay

## 2011-03-23 NOTE — Telephone Encounter (Signed)
FMla form signed and to MR.

## 2011-04-06 ENCOUNTER — Ambulatory Visit (INDEPENDENT_AMBULATORY_CARE_PROVIDER_SITE_OTHER): Payer: Managed Care, Other (non HMO) | Admitting: General Surgery

## 2011-04-06 ENCOUNTER — Encounter (INDEPENDENT_AMBULATORY_CARE_PROVIDER_SITE_OTHER): Payer: Self-pay | Admitting: General Surgery

## 2011-04-06 VITALS — BP 122/80 | HR 70 | Temp 98.2°F | Resp 18 | Ht 67.0 in | Wt 150.2 lb

## 2011-04-06 DIAGNOSIS — Z9889 Other specified postprocedural states: Secondary | ICD-10-CM

## 2011-04-06 NOTE — Progress Notes (Signed)
Subjective:     Patient ID: [name], male   DOB: May 27, 1955, 56 y.o.   MRN: [id number]  HPI This is a pleasant 56 year old man with multiple sclerosis who underwent open repair of left inguinal hernia with mesh on March 17, 2011. He is making good progress. Almost no pain. He notices some swelling and thickening of the tissues. Bruising has resolved. He wants to know when he can go back to using his bowflex exercise machine.  Review of Systems     Objective:   Physical Exam Patient looks well. In no distress. Ambulating with a cane, as usual  Left groin incision is healing. There is a fair amount of tissue edema and thickening, but no infection, no hematoma, and no recurrence of the hernia. Penis scrotum and testes looked normal.    Assessment:     Left inguinal hernia, recovering without obvious complication following open repair with mesh.  Moderately significant wound edema, anticipate this will be self-limited  Multiple sclerosis.    Plan:     Resume normal exercise regimen after March 15.  Return to see me in 2 months to make sure the wound healing has progressed normally.     Angelia Mould. Derrell Lolling, M.D., [facility] General and Minimally invasive Surgery Breast and Colorectal Surgery Office:   [phone] Pager:   [phone]

## 2011-04-06 NOTE — Patient Instructions (Signed)
Your left hernia incision is healing without any major complications. I think that the swelling will resolve in time. You may resume normal exercise after March 15.  Return to see Dr. Derrell Lolling in 2 months for a final wound healing check.

## 2011-04-25 ENCOUNTER — Encounter: Payer: Self-pay | Admitting: Internal Medicine

## 2011-04-25 ENCOUNTER — Ambulatory Visit (INDEPENDENT_AMBULATORY_CARE_PROVIDER_SITE_OTHER): Payer: Managed Care, Other (non HMO) | Admitting: Internal Medicine

## 2011-04-25 VITALS — BP 118/80 | HR 80 | Temp 97.9°F | Resp 16 | Wt 152.0 lb

## 2011-04-25 DIAGNOSIS — R269 Unspecified abnormalities of gait and mobility: Secondary | ICD-10-CM

## 2011-04-25 DIAGNOSIS — M545 Low back pain: Secondary | ICD-10-CM

## 2011-04-25 DIAGNOSIS — G35 Multiple sclerosis: Secondary | ICD-10-CM

## 2011-04-25 DIAGNOSIS — E559 Vitamin D deficiency, unspecified: Secondary | ICD-10-CM

## 2011-04-25 DIAGNOSIS — K4090 Unilateral inguinal hernia, without obstruction or gangrene, not specified as recurrent: Secondary | ICD-10-CM

## 2011-04-25 NOTE — Assessment & Plan Note (Signed)
S/p repair

## 2011-04-25 NOTE — Assessment & Plan Note (Signed)
Chronic and progressing

## 2011-04-25 NOTE — Assessment & Plan Note (Signed)
Continue with current prescription therapy as reflected on the Med list.  

## 2011-04-25 NOTE — Assessment & Plan Note (Signed)
Cane

## 2011-04-25 NOTE — Progress Notes (Signed)
Patient ID: [name], male   DOB: December 20, 1955, 56 y.o.   MRN: [id number]  Subjective:    Patient ID: [name], male    DOB: 1956/02/05, 56 y.o.   MRN: [id number]  HPI  The patient is here to follow up on chronic MS - worse, planned to go on disability, but he stayed working instead. C/o weakness, spasms, headaches and chronic moderate gait imbalance symptoms not controlled with medicines and exercise. C/o R hernia.   Wt Readings from Last 3 Encounters:  04/25/11 152 lb (68.947 kg)  04/06/11 150 lb 3.2 oz (68.13 kg)  03/17/11 150 lb (68.04 kg)   BP Readings from Last 3 Encounters:  04/25/11 118/80  04/06/11 122/80  03/17/11 132/86     Review of Systems  Constitutional: Negative for appetite change, fatigue and unexpected weight change.  HENT: Negative for nosebleeds, congestion, sore throat, sneezing, trouble swallowing and neck pain.   Eyes: Negative for itching and visual disturbance.  Respiratory: Negative for cough.   Cardiovascular: Negative for chest pain, palpitations and leg swelling.  Gastrointestinal: Negative for nausea, diarrhea, blood in stool and abdominal distention.  Genitourinary: Negative for frequency and hematuria.  Musculoskeletal: Positive for back pain, arthralgias and gait problem. Negative for joint swelling.  Skin: Negative for rash.  Neurological: Negative for dizziness, tremors, speech difficulty and weakness.  Psychiatric/Behavioral: Positive for sleep disturbance. Negative for dysphoric mood and agitation. The patient is not nervous/anxious.        Objective:   Physical Exam  Constitutional: He is oriented to person, place, and time. He appears well-developed.  HENT:  Mouth/Throat: Oropharynx is clear and moist.  Eyes: Conjunctivae are normal. Pupils are equal, round, and reactive to light.  Neck: Normal range of motion. No JVD present. No thyromegaly present.  Cardiovascular: Normal rate, regular rhythm, normal heart sounds and  intact distal pulses.  Exam reveals no gallop and no friction rub.   No murmur heard. Pulmonary/Chest: Effort normal and breath sounds normal. No respiratory distress. He has no wheezes. He has no rales. He exhibits no tenderness.  Abdominal: Soft. Bowel sounds are normal. He exhibits mass (L inguinal hernia). He exhibits no distension. There is no tenderness. There is no rebound and no guarding.  Musculoskeletal: Normal range of motion. He exhibits tenderness (LS). He exhibits no edema.  Lymphadenopathy:    He has no cervical adenopathy.  Neurological: He is alert and oriented to person, place, and time. He displays abnormal reflex. No cranial nerve deficit. He exhibits abnormal muscle tone (hyperrefexia). Coordination abnormal.  Skin: Skin is warm and dry. Rash (mild SD) noted.  Psychiatric: He has a normal mood and affect. His behavior is normal. Judgment and thought content normal.  L ing hernia repaired - oval large swelling is present NT  Lab Results  Component Value Date   WBC 6.6 03/15/2011   HGB 16.1 03/15/2011   HCT 46.2 03/15/2011   PLT PLATELET CLUMPS NOTED ON SMEAR, COUNT APPEARS ADEQUATE 03/15/2011   GLUCOSE 102* 03/15/2011   CHOL 172 07/14/2010   TRIG 118.0 07/14/2010   HDL 45.80 07/14/2010   LDLDIRECT 118.4 06/09/2009   LDLCALC 103* 07/14/2010   ALT 16 03/15/2011   AST 16 03/15/2011   NA 141 03/15/2011   K 4.7 03/15/2011   CL 101 03/15/2011   CREATININE 0.96 03/15/2011   BUN 18 03/15/2011   CO2 32 03/15/2011   TSH 1.672 10/07/2010   PSA 1.30 07/14/2010   INR 1.0 08/24/2008  Assessment & Plan:

## 2011-05-04 ENCOUNTER — Encounter (INDEPENDENT_AMBULATORY_CARE_PROVIDER_SITE_OTHER): Payer: Managed Care, Other (non HMO) | Admitting: General Surgery

## 2011-06-01 ENCOUNTER — Encounter (INDEPENDENT_AMBULATORY_CARE_PROVIDER_SITE_OTHER): Payer: Self-pay | Admitting: General Surgery

## 2011-06-01 ENCOUNTER — Ambulatory Visit (INDEPENDENT_AMBULATORY_CARE_PROVIDER_SITE_OTHER): Payer: Managed Care, Other (non HMO) | Admitting: General Surgery

## 2011-06-01 VITALS — BP 115/84 | HR 75 | Temp 97.0°F | Resp 14 | Ht 67.0 in | Wt 151.2 lb

## 2011-06-01 DIAGNOSIS — K4090 Unilateral inguinal hernia, without obstruction or gangrene, not specified as recurrent: Secondary | ICD-10-CM

## 2011-06-01 NOTE — Patient Instructions (Signed)
Your left inguinal hernia repair appears to have healed without any complication. You may resume normal physical activities without restriction.  Return to see Dr. Derrell Lolling if any new problems arise.

## 2011-06-01 NOTE — Progress Notes (Signed)
Subjective:     Patient ID: [name], male   DOB: Sep 10, 1955, 56 y.o.   MRN: [id number]  HPI This 56 year old man with multiple sclerosis underwent open repair of left inguinal hernia with mesh on February 8. He had lots of edema postop. He states that that is mostly resolved he feels fine. He has no pain.  Review of Systems     Objective:   Physical Exam Patient looks well. In no distress. Ambulating with a cane.  Left groin incision well healed. 90-95% of the edema and swelling have resolved. The repair is intact. Penis scrotum and testes are normal.    Assessment:     Left inguinal hernia, uneventful  recovery followed for open repair with mesh.    Plan:     Okay to resume all normal physical activities without restriction.  Return to see me p.r.n.   Angelia Mould. Derrell Lolling, M.D., [facility] General and Minimally invasive Surgery Breast and Colorectal Surgery Office:   [phone] Pager:   [phone]

## 2011-08-25 ENCOUNTER — Ambulatory Visit (INDEPENDENT_AMBULATORY_CARE_PROVIDER_SITE_OTHER): Payer: Managed Care, Other (non HMO) | Admitting: Internal Medicine

## 2011-08-25 ENCOUNTER — Encounter: Payer: Self-pay | Admitting: Internal Medicine

## 2011-08-25 VITALS — BP 110/68 | HR 76 | Temp 98.0°F | Resp 16 | Wt 147.0 lb

## 2011-08-25 DIAGNOSIS — R269 Unspecified abnormalities of gait and mobility: Secondary | ICD-10-CM

## 2011-08-25 DIAGNOSIS — G35 Multiple sclerosis: Secondary | ICD-10-CM

## 2011-08-25 DIAGNOSIS — L219 Seborrheic dermatitis, unspecified: Secondary | ICD-10-CM

## 2011-08-25 DIAGNOSIS — R202 Paresthesia of skin: Secondary | ICD-10-CM

## 2011-08-25 DIAGNOSIS — K4090 Unilateral inguinal hernia, without obstruction or gangrene, not specified as recurrent: Secondary | ICD-10-CM

## 2011-08-25 DIAGNOSIS — M545 Low back pain, unspecified: Secondary | ICD-10-CM

## 2011-08-25 DIAGNOSIS — R209 Unspecified disturbances of skin sensation: Secondary | ICD-10-CM

## 2011-08-25 MED ORDER — CLOTRIMAZOLE-BETAMETHASONE 1-0.05 % EX CREA: TOPICAL_CREAM | Freq: Two times a day (BID) | CUTANEOUS | Status: DC

## 2011-08-25 MED ORDER — TRAMADOL HCL ER 100 MG PO TB24: 100.0000 mg | ORAL_TABLET | Freq: Every day | ORAL | Status: DC | PRN

## 2011-08-25 NOTE — Assessment & Plan Note (Signed)
Using a cane 

## 2011-08-25 NOTE — Assessment & Plan Note (Signed)
Repaired 2013

## 2011-08-25 NOTE — Progress Notes (Signed)
Patient ID: [name], male   DOB: 12-Jan-1956, 56 y.o.   MRN: [id number]  Subjective:    Patient ID: [name], male    DOB: November 16, 1955, 56 y.o.   MRN: [id number]  HPI  The patient is here to follow up on chronic MS - worse, planned to go on disability, but he stayed working instead. C/o weakness, spasms, headaches and chronic moderate gait imbalance symptoms not controlled with medicines and exercise. Some HAs, fatigue   Wt Readings from Last 3 Encounters:  08/25/11 147 lb (66.679 kg)  06/01/11 151 lb 3.2 oz (68.584 kg)  04/25/11 152 lb (68.947 kg)   BP Readings from Last 3 Encounters:  08/25/11 110/68  06/01/11 115/84  04/25/11 118/80     Review of Systems  Constitutional: Negative for appetite change, fatigue and unexpected weight change.  HENT: Negative for nosebleeds, congestion, sore throat, sneezing, trouble swallowing and neck pain.   Eyes: Negative for itching and visual disturbance.  Respiratory: Negative for cough.   Cardiovascular: Negative for chest pain, palpitations and leg swelling.  Gastrointestinal: Negative for nausea, diarrhea, blood in stool and abdominal distention.  Genitourinary: Negative for frequency and hematuria.  Musculoskeletal: Positive for back pain, arthralgias and gait problem. Negative for joint swelling.  Skin: Negative for rash.  Neurological: Negative for dizziness, tremors, speech difficulty and weakness.  Psychiatric/Behavioral: Positive for disturbed wake/sleep cycle. Negative for dysphoric mood and agitation. The patient is not nervous/anxious.        Objective:   Physical Exam  Constitutional: He is oriented to person, place, and time. He appears well-developed.  HENT:  Mouth/Throat: Oropharynx is clear and moist.  Eyes: Conjunctivae are normal. Pupils are equal, round, and reactive to light.  Neck: Normal range of motion. No JVD present. No thyromegaly present.  Cardiovascular: Normal rate, regular rhythm, normal heart  sounds and intact distal pulses.  Exam reveals no gallop and no friction rub.   No murmur heard. Pulmonary/Chest: Effort normal and breath sounds normal. No respiratory distress. He has no wheezes. He has no rales. He exhibits no tenderness.  Abdominal: Soft. Bowel sounds are normal. He exhibits mass (L inguinal hernia). He exhibits no distension. There is no tenderness. There is no rebound and no guarding.  Musculoskeletal: Normal range of motion. He exhibits tenderness (LS). He exhibits no edema.  Lymphadenopathy:    He has no cervical adenopathy.  Neurological: He is alert and oriented to person, place, and time. He displays abnormal reflex. No cranial nerve deficit. He exhibits abnormal muscle tone (hyperrefexia). Coordination abnormal.  Skin: Skin is warm and dry. Rash (mild SD) noted.  Psychiatric: He has a normal mood and affect. His behavior is normal. Judgment and thought content normal.  L ing hernia repaired - oval large swelling is present NT SD rash  Lab Results  Component Value Date   WBC 6.6 03/15/2011   HGB 16.1 03/15/2011   HCT 46.2 03/15/2011   PLT PLATELET CLUMPS NOTED ON SMEAR, COUNT APPEARS ADEQUATE 03/15/2011   GLUCOSE 102* 03/15/2011   CHOL 172 07/14/2010   TRIG 118.0 07/14/2010   HDL 45.80 07/14/2010   LDLDIRECT 118.4 06/09/2009   LDLCALC 103* 07/14/2010   ALT 16 03/15/2011   AST 16 03/15/2011   NA 141 03/15/2011   K 4.7 03/15/2011   CL 101 03/15/2011   CREATININE 0.96 03/15/2011   BUN 18 03/15/2011   CO2 32 03/15/2011   TSH 1.672 10/07/2010   PSA 1.30 07/14/2010   INR 1.0  08/24/2008        Assessment & Plan:

## 2011-08-25 NOTE — Assessment & Plan Note (Signed)
Continue with current prescription therapy as reflected on the Med list.  

## 2011-08-25 NOTE — Assessment & Plan Note (Signed)
lotrisone bid  °

## 2011-09-11 ENCOUNTER — Encounter: Payer: Self-pay | Admitting: Internal Medicine

## 2011-09-11 ENCOUNTER — Ambulatory Visit (INDEPENDENT_AMBULATORY_CARE_PROVIDER_SITE_OTHER): Payer: Managed Care, Other (non HMO) | Admitting: Internal Medicine

## 2011-09-11 ENCOUNTER — Other Ambulatory Visit (INDEPENDENT_AMBULATORY_CARE_PROVIDER_SITE_OTHER): Payer: Managed Care, Other (non HMO)

## 2011-09-11 VITALS — BP 130/90 | HR 80 | Temp 98.0°F | Resp 16 | Wt 147.0 lb

## 2011-09-11 DIAGNOSIS — M25539 Pain in unspecified wrist: Secondary | ICD-10-CM

## 2011-09-11 DIAGNOSIS — M25531 Pain in right wrist: Secondary | ICD-10-CM

## 2011-09-11 LAB — BASIC METABOLIC PANEL
BUN: 17 mg/dL (ref 6–23)
CO2: 32 mEq/L (ref 19–32)
Calcium: 9 mg/dL (ref 8.4–10.5)
GFR: 123.72 mL/min (ref >60.00)
Glucose, Bld: 97 mg/dL (ref 70–99)

## 2011-09-11 MED ORDER — DICLOFENAC SODIUM 1 % TD GEL: 1.0000 "application " | Freq: Four times a day (QID) | TRANSDERMAL | Status: DC

## 2011-09-11 NOTE — Progress Notes (Signed)
Subjective:    Patient ID: [name], male    DOB: [birth year], 56 y.o.   MRN: [id number]  Wrist Pain  The pain is present in the right wrist. This is a new problem. The current episode started more than 1 month ago (x9mo). There has been no history of extremity trauma. The problem occurs daily. The problem has been gradually worsening. The quality of the pain is described as aching. The pain is moderate. Pertinent negatives include no fever or tingling. He has tried NSAIDS for the symptoms. The treatment provided mild relief. Family history does not include gout. There is no history of gout or rheumatoid arthritis.    The patient is here to follow up on chronic MS - worse, planned to go on disability, but he stayed working instead. C/o weakness, spasms, headaches and chronic moderate gait imbalance symptoms not controlled with medicines and exercise. Some HAs, fatigue   Wt Readings from Last 3 Encounters:  09/11/11 147 lb (66.679 kg)  08/25/11 147 lb (66.679 kg)  06/01/11 151 lb 3.2 oz (68.584 kg)   BP Readings from Last 3 Encounters:  09/11/11 130/90  08/25/11 110/68  06/01/11 115/84     Review of Systems  Constitutional: Negative for fever, appetite change, fatigue and unexpected weight change.  HENT: Negative for nosebleeds, congestion, sore throat, sneezing, trouble swallowing and neck pain.   Eyes: Negative for itching and visual disturbance.  Respiratory: Negative for cough.   Cardiovascular: Negative for chest pain, palpitations and leg swelling.  Gastrointestinal: Negative for nausea, diarrhea, blood in stool and abdominal distention.  Genitourinary: Negative for frequency and hematuria.  Musculoskeletal: Positive for back pain, arthralgias and gait problem. Negative for joint swelling and gout.  Skin: Negative for rash.  Neurological: Negative for dizziness, tingling, tremors, speech difficulty and weakness.  Psychiatric/Behavioral: Positive for disturbed  wake/sleep cycle. Negative for dysphoric mood and agitation. The patient is not nervous/anxious.        Objective:   Physical Exam  Constitutional: He is oriented to person, place, and time. He appears well-developed.  HENT:  Mouth/Throat: Oropharynx is clear and moist.  Eyes: Conjunctivae are normal. Pupils are equal, round, and reactive to light.  Neck: Normal range of motion. No JVD present. No thyromegaly present.  Cardiovascular: Normal rate, regular rhythm, normal heart sounds and intact distal pulses.  Exam reveals no gallop and no friction rub.   No murmur heard. Pulmonary/Chest: Effort normal and breath sounds normal. No respiratory distress. He has no wheezes. He has no rales. He exhibits no tenderness.  Abdominal: Soft. Bowel sounds are normal. He exhibits mass (L inguinal hernia). He exhibits no distension. There is no tenderness. There is no rebound and no guarding.  Musculoskeletal: Normal range of motion. He exhibits tenderness (LS). He exhibits no edema.  Lymphadenopathy:    He has no cervical adenopathy.  Neurological: He is alert and oriented to person, place, and time. He displays abnormal reflex. No cranial nerve deficit. He exhibits abnormal muscle tone (hyperrefexia). Coordination abnormal.  Skin: Skin is warm and dry. Rash (mild SD) noted.  Psychiatric: He has a normal mood and affect. His behavior is normal. Judgment and thought content normal.  L ing hernia repaired - oval large swelling is present NT SD rash  Lab Results  Component Value Date   WBC 6.6 03/15/2011   HGB 16.1 03/15/2011   HCT 46.2 03/15/2011   PLT PLATELET CLUMPS NOTED ON SMEAR, COUNT APPEARS ADEQUATE 03/15/2011   GLUCOSE 102*  03/15/2011   CHOL 172 07/14/2010   TRIG 118.0 07/14/2010   HDL 45.80 07/14/2010   LDLDIRECT 118.4 06/09/2009   LDLCALC 103* 07/14/2010   ALT 16 03/15/2011   AST 16 03/15/2011   NA 141 03/15/2011   K 4.7 03/15/2011   CL 101 03/15/2011   CREATININE 0.96 03/15/2011   BUN 18 03/15/2011   CO2 32  03/15/2011   TSH 1.672 10/07/2010   PSA 1.30 07/14/2010   INR 1.0 08/24/2008    Procedure Note :    Procedure :   Point of care (POC) sonography examination   Indication: R wrist swelling   Equipment used: Sonosite M-Turbo with HFL38x/13-6 MHz transducer linear probe. The images were stored in the unit and later transferred in storage.  The patient was placed in a decubitus position.  This study revealed a hypoechoic fluid in the R carpal tunnel with increased vascular activity on Doppler   Impression: a hypoechoic fluid in the R carpal tunnel with increased vascular activity on Doppler c/w inflamation       Assessment & Plan:

## 2011-09-11 NOTE — Assessment & Plan Note (Addendum)
8/13   x3 mo [place] See Meds - Meloxicap and Voltaren gel [facility]

## 2011-09-12 ENCOUNTER — Encounter: Payer: Self-pay | Admitting: Internal Medicine

## 2011-09-12 ENCOUNTER — Telehealth: Payer: Self-pay | Admitting: Internal Medicine

## 2011-09-12 LAB — RHEUMATOID FACTOR: Rhuematoid fact SerPl-aCnc: <10 IU/mL (ref <=14)

## 2011-09-12 NOTE — Telephone Encounter (Signed)
[name], please, inform patient that all labs are normal 

## 2011-09-12 NOTE — Telephone Encounter (Signed)
Pt's spouse informed 

## 2011-11-21 ENCOUNTER — Other Ambulatory Visit: Payer: Self-pay | Admitting: Neurology

## 2011-11-21 DIAGNOSIS — G35 Multiple sclerosis: Secondary | ICD-10-CM

## 2011-11-25 ENCOUNTER — Other Ambulatory Visit: Payer: Self-pay | Admitting: Internal Medicine

## 2011-11-27 ENCOUNTER — Other Ambulatory Visit: Payer: Self-pay | Admitting: Neurology

## 2011-11-27 DIAGNOSIS — G35 Multiple sclerosis: Secondary | ICD-10-CM

## 2011-12-01 ENCOUNTER — Ambulatory Visit
Admission: RE | Admit: 2011-12-01 | Discharge: 2011-12-01 | Disposition: A | Discharge status: Home / Self Care | Payer: 59 | Source: Ambulatory Visit | Attending: Neurology | Admitting: Neurology

## 2011-12-01 DIAGNOSIS — G35 Multiple sclerosis: Secondary | ICD-10-CM

## 2011-12-01 MED ORDER — GADOBENATE DIMEGLUMINE 529 MG/ML IV SOLN
13.0000 mL | Freq: Once | INTRAVENOUS | Status: AC | PRN
  Administered 2011-12-01: 13 mL via INTRAVENOUS

## 2011-12-11 ENCOUNTER — Other Ambulatory Visit: Payer: Self-pay | Admitting: Internal Medicine

## 2011-12-21 ENCOUNTER — Telehealth: Payer: Self-pay | Admitting: *Deleted

## 2011-12-21 NOTE — Telephone Encounter (Signed)
Left msg on triage stating husband saw his MS md this morning Dr. [name] @ [facility]. She is wanting his last cpx results fax to [phone].../lmb    Called wife back inform her labs ha been faxed...Raechel Chute

## 2012-02-26 ENCOUNTER — Ambulatory Visit: Payer: Managed Care, Other (non HMO) | Admitting: Internal Medicine

## 2012-03-04 ENCOUNTER — Encounter: Payer: Self-pay | Admitting: Internal Medicine

## 2012-03-04 ENCOUNTER — Ambulatory Visit (INDEPENDENT_AMBULATORY_CARE_PROVIDER_SITE_OTHER): Payer: 59 | Admitting: Internal Medicine

## 2012-03-04 VITALS — BP 128/86 | HR 88 | Temp 98.8°F | Resp 16 | Wt 151.0 lb

## 2012-03-04 DIAGNOSIS — M545 Low back pain: Secondary | ICD-10-CM

## 2012-03-04 DIAGNOSIS — M25539 Pain in unspecified wrist: Secondary | ICD-10-CM

## 2012-03-04 DIAGNOSIS — R209 Unspecified disturbances of skin sensation: Secondary | ICD-10-CM

## 2012-03-04 DIAGNOSIS — R202 Paresthesia of skin: Secondary | ICD-10-CM

## 2012-03-04 DIAGNOSIS — M25531 Pain in right wrist: Secondary | ICD-10-CM

## 2012-03-04 DIAGNOSIS — E559 Vitamin D deficiency, unspecified: Secondary | ICD-10-CM

## 2012-03-04 DIAGNOSIS — G35 Multiple sclerosis: Secondary | ICD-10-CM

## 2012-03-04 DIAGNOSIS — L219 Seborrheic dermatitis, unspecified: Secondary | ICD-10-CM

## 2012-03-04 NOTE — Progress Notes (Signed)
   Subjective:    HPI  The patient is here to follow up on chronic MS - worse, planned to go on disability, but he stayed working instead. C/o weakness, spasms, headaches and chronic moderate gait imbalance symptoms not controlled with medicines and exercise. Some HAs, fatigue. MRI had no new change last time...   Wt Readings from Last 3 Encounters:  03/04/12 151 lb (68.493 kg)  09/11/11 147 lb (66.679 kg)  08/25/11 147 lb (66.679 kg)   BP Readings from Last 3 Encounters:  03/04/12 128/86  09/11/11 130/90  08/25/11 110/68     Review of Systems  Constitutional: Negative for appetite change, fatigue and unexpected weight change.  HENT: Negative for nosebleeds, congestion, sore throat, sneezing, trouble swallowing and neck pain.   Eyes: Negative for itching and visual disturbance.  Respiratory: Negative for cough.   Cardiovascular: Negative for chest pain, palpitations and leg swelling.  Gastrointestinal: Negative for nausea, diarrhea, blood in stool and abdominal distention.  Genitourinary: Negative for frequency and hematuria.  Musculoskeletal: Positive for back pain, arthralgias and gait problem. Negative for joint swelling.  Skin: Negative for rash.  Neurological: Negative for dizziness, tremors, speech difficulty and weakness.  Psychiatric/Behavioral: Positive for sleep disturbance. Negative for dysphoric mood and agitation. The patient is not nervous/anxious.   Hand weakness B     Objective:   Physical Exam  Constitutional: He is oriented to person, place, and time. He appears well-developed.  HENT:  Mouth/Throat: Oropharynx is clear and moist.  Eyes: Conjunctivae normal are normal. Pupils are equal, round, and reactive to light.  Neck: Normal range of motion. No JVD present. No thyromegaly present.  Cardiovascular: Normal rate, regular rhythm, normal heart sounds and intact distal pulses.  Exam reveals no gallop and no friction rub.   No murmur heard. Pulmonary/Chest:  Effort normal and breath sounds normal. No respiratory distress. He has no wheezes. He has no rales. He exhibits no tenderness.  Abdominal: Soft. Bowel sounds are normal. He exhibits mass (L inguinal hernia). He exhibits no distension. There is no tenderness. There is no rebound and no guarding.  Musculoskeletal: Normal range of motion. He exhibits tenderness (LS). He exhibits no edema.  Lymphadenopathy:    He has no cervical adenopathy.  Neurological: He is alert and oriented to person, place, and time. He displays abnormal reflex. No cranial nerve deficit. He exhibits abnormal muscle tone (hyperrefexia). Coordination abnormal.  Skin: Skin is warm and dry. Rash (mild SD) noted.  Psychiatric: He has a normal mood and affect. His behavior is normal. Judgment and thought content normal.  Ataxic No SD rash  Lab Results  Component Value Date   WBC 6.6 03/15/2011   HGB 16.1 03/15/2011   HCT 46.2 03/15/2011   PLT PLATELET CLUMPS NOTED ON SMEAR, COUNT APPEARS ADEQUATE 03/15/2011   GLUCOSE 97 09/11/2011   CHOL 172 07/14/2010   TRIG 118.0 07/14/2010   HDL 45.80 07/14/2010   LDLDIRECT 118.4 06/09/2009   LDLCALC 103* 07/14/2010   ALT 16 03/15/2011   AST 16 03/15/2011   NA 141 09/11/2011   K 4.2 09/11/2011   CL 103 09/11/2011   CREATININE 0.7 09/11/2011   BUN 17 09/11/2011   CO2 32 09/11/2011   TSH 1.672 10/07/2010   PSA 1.30 07/14/2010   INR 1.0 08/24/2008          Assessment & Plan:

## 2012-03-05 ENCOUNTER — Other Ambulatory Visit: Payer: Self-pay | Admitting: Internal Medicine

## 2012-03-05 ENCOUNTER — Telehealth: Payer: Self-pay | Admitting: *Deleted

## 2012-03-05 DIAGNOSIS — Z0000 Encounter for general adult medical examination without abnormal findings: Secondary | ICD-10-CM

## 2012-03-05 DIAGNOSIS — Z0389 Encounter for observation for other suspected diseases and conditions ruled out: Secondary | ICD-10-CM

## 2012-03-05 NOTE — Telephone Encounter (Signed)
Message copied by Merrilyn Puma on Tue Mar 05, 2012  8:15 AM ------      Message from: Etheleen Sia      Created: Mon Mar 04, 2012  9:36 AM      Regarding: LABS       PHYSICAL LABS IN [place]

## 2012-03-05 NOTE — Telephone Encounter (Signed)
CPE labs entered.  

## 2012-03-06 NOTE — Assessment & Plan Note (Signed)
Continue with current prescription therapy as reflected on the Med list.  

## 2012-03-06 NOTE — Assessment & Plan Note (Signed)
On gabapentin.  

## 2012-03-06 NOTE — Assessment & Plan Note (Signed)
Better  

## 2012-03-15 ENCOUNTER — Other Ambulatory Visit: Payer: Self-pay | Admitting: Internal Medicine

## 2012-09-02 ENCOUNTER — Other Ambulatory Visit (INDEPENDENT_AMBULATORY_CARE_PROVIDER_SITE_OTHER): Payer: 59

## 2012-09-02 DIAGNOSIS — Z0000 Encounter for general adult medical examination without abnormal findings: Secondary | ICD-10-CM

## 2012-09-02 DIAGNOSIS — Z0389 Encounter for observation for other suspected diseases and conditions ruled out: Secondary | ICD-10-CM

## 2012-09-02 LAB — URINALYSIS, ROUTINE W REFLEX MICROSCOPIC
Leukocytes, UA: NEGATIVE
Nitrite: NEGATIVE
RBC / HPF: NONE SEEN (ref 0–?)
Specific Gravity, Urine: <=1.005 (ref 1.000–1.030)
Total Protein, Urine: NEGATIVE
WBC, UA: NONE SEEN (ref 0–?)
pH: 6 (ref 5.0–8.0)

## 2012-09-02 LAB — CBC WITH DIFFERENTIAL/PLATELET
Eosinophils Relative: 3.1 % (ref 0.0–5.0)
HCT: 41.8 % (ref 39.0–52.0)
Hemoglobin: 14 g/dL (ref 13.0–17.0)
Lymphocytes Relative: 25.2 % (ref 12.0–46.0)
Lymphs Abs: 1.5 10*3/uL (ref 0.7–4.0)
Monocytes Relative: 8.5 % (ref 3.0–12.0)
Platelets: 186 10*3/uL (ref 150.0–400.0)
WBC: 6.1 10*3/uL (ref 4.5–10.5)

## 2012-09-02 LAB — BASIC METABOLIC PANEL
BUN: 25 mg/dL — ABNORMAL HIGH (ref 6–23)
Calcium: 9 mg/dL (ref 8.4–10.5)
Creatinine, Ser: 0.8 mg/dL (ref 0.4–1.5)
GFR: 105.68 mL/min (ref >60.00)
Glucose, Bld: 99 mg/dL (ref 70–99)
Potassium: 3.8 mEq/L (ref 3.5–5.1)

## 2012-09-02 LAB — HEPATIC FUNCTION PANEL
AST: 15 U/L (ref 0–37)
Albumin: 4.1 g/dL (ref 3.5–5.2)
Total Bilirubin: 0.6 mg/dL (ref 0.3–1.2)

## 2012-09-02 LAB — TSH: TSH: 2.79 u[IU]/mL (ref 0.35–5.50)

## 2012-09-02 LAB — LIPID PANEL
Cholesterol: 192 mg/dL (ref 0–200)
HDL: 54.4 mg/dL (ref >39.00)
LDL Cholesterol: 128 mg/dL — ABNORMAL HIGH (ref 0–99)
VLDL: 9.2 mg/dL (ref 0.0–40.0)

## 2012-09-02 LAB — PSA: PSA: 1.9 ng/mL (ref 0.10–4.00)

## 2012-09-04 ENCOUNTER — Encounter: Payer: Self-pay | Admitting: Internal Medicine

## 2012-09-04 ENCOUNTER — Ambulatory Visit (INDEPENDENT_AMBULATORY_CARE_PROVIDER_SITE_OTHER): Payer: BC Managed Care – PPO | Admitting: Internal Medicine

## 2012-09-04 ENCOUNTER — Other Ambulatory Visit: Payer: Self-pay | Admitting: *Deleted

## 2012-09-04 VITALS — BP 118/70 | HR 72 | Temp 97.8°F | Resp 16 | Ht 67.0 in | Wt 147.1 lb

## 2012-09-04 DIAGNOSIS — E559 Vitamin D deficiency, unspecified: Secondary | ICD-10-CM

## 2012-09-04 DIAGNOSIS — Z0000 Encounter for general adult medical examination without abnormal findings: Secondary | ICD-10-CM

## 2012-09-04 DIAGNOSIS — Z23 Encounter for immunization: Secondary | ICD-10-CM

## 2012-09-04 DIAGNOSIS — J45901 Unspecified asthma with (acute) exacerbation: Secondary | ICD-10-CM

## 2012-09-04 MED ORDER — AMITRIPTYLINE HCL 25 MG PO TABS: 75.0000 mg | ORAL_TABLET | Freq: Every day | ORAL | Status: DC

## 2012-09-04 MED ORDER — CLOTRIMAZOLE-BETAMETHASONE 1-0.05 % EX CREA: TOPICAL_CREAM | Freq: Two times a day (BID) | CUTANEOUS | Status: DC

## 2012-09-04 MED ORDER — TRAMADOL HCL ER 100 MG PO TB24: 100.0000 mg | ORAL_TABLET | Freq: Every day | ORAL | Status: DC | PRN

## 2012-09-04 MED ORDER — MELOXICAM 7.5 MG PO TABS: 7.5000 mg | ORAL_TABLET | Freq: Every day | ORAL | Status: DC

## 2012-09-04 MED ORDER — GABAPENTIN 100 MG PO CAPS: 200.0000 mg | ORAL_CAPSULE | Freq: Three times a day (TID) | ORAL | Status: DC

## 2012-09-04 MED ORDER — DALFAMPRIDINE ER 10 MG PO TB12: 10.0000 mg | ORAL_TABLET | Freq: Two times a day (BID) | ORAL | Status: DC

## 2012-09-04 MED ORDER — BACLOFEN 20 MG PO TABS: 20.0000 mg | ORAL_TABLET | Freq: Four times a day (QID) | ORAL | Status: DC | PRN

## 2012-09-04 NOTE — Assessment & Plan Note (Signed)
We discussed age appropriate health related issues, including available/recomended screening tests and vaccinations. We discussed a need for adhering to healthy diet and exercise. Labs/EKG were reviewed/ordered. All questions were answered.   

## 2012-09-04 NOTE — Assessment & Plan Note (Signed)
Continue with current prescription therapy as reflected on the Med list.  

## 2012-09-04 NOTE — Progress Notes (Signed)
   Subjective:    HPI  The patient is here for a wellness exam. The patient has been doing well overall without major physical or psychological issues going on lately.  The patient is here to follow up on chronic MS - not better, he went on disability in 5/14 F/u weakness, spasms, headaches and chronic moderate gait imbalance symptoms not controlled with medicines and exercise. Some HAs, fatigue - better. MRI had no new change last time...   Wt Readings from Last 3 Encounters:  09/04/12 147 lb 2 oz (66.735 kg)  03/04/12 151 lb (68.493 kg)  09/11/11 147 lb (66.679 kg)   BP Readings from Last 3 Encounters:  09/04/12 118/70  03/04/12 128/86  09/11/11 130/90     Review of Systems  Constitutional: Negative for appetite change, fatigue and unexpected weight change.  HENT: Negative for nosebleeds, congestion, sore throat, sneezing, trouble swallowing and neck pain.   Eyes: Negative for itching and visual disturbance.  Respiratory: Negative for cough.   Cardiovascular: Negative for chest pain, palpitations and leg swelling.  Gastrointestinal: Negative for nausea, diarrhea, blood in stool and abdominal distention.  Genitourinary: Negative for frequency and hematuria.  Musculoskeletal: Positive for back pain, arthralgias and gait problem. Negative for joint swelling.  Skin: Negative for rash.  Neurological: Negative for dizziness, tremors, speech difficulty and weakness.  Psychiatric/Behavioral: Positive for sleep disturbance. Negative for dysphoric mood and agitation. The patient is not nervous/anxious.   Hand weakness B     Objective:   Physical Exam  Constitutional: He is oriented to person, place, and time. He appears well-developed.  HENT:  Mouth/Throat: Oropharynx is clear and moist.  Eyes: Conjunctivae are normal. Pupils are equal, round, and reactive to light.  Neck: Normal range of motion. No JVD present. No thyromegaly present.  Cardiovascular: Normal rate, regular  rhythm, normal heart sounds and intact distal pulses.  Exam reveals no gallop and no friction rub.   No murmur heard. Pulmonary/Chest: Effort normal and breath sounds normal. No respiratory distress. He has no wheezes. He has no rales. He exhibits no tenderness.  Abdominal: Soft. Bowel sounds are normal. He exhibits mass (L inguinal hernia). He exhibits no distension. There is no tenderness. There is no rebound and no guarding.  Musculoskeletal: Normal range of motion. He exhibits tenderness (LS). He exhibits no edema.  Lymphadenopathy:    He has no cervical adenopathy.  Neurological: He is alert and oriented to person, place, and time. He displays abnormal reflex. No cranial nerve deficit. He exhibits abnormal muscle tone (hyperrefexia). Coordination abnormal.  Skin: Skin is warm and dry. Rash (mild SD) noted.  Psychiatric: He has a normal mood and affect. His behavior is normal. Judgment and thought content normal.  Ataxic No SD rash  Lab Results  Component Value Date   WBC 6.1 09/02/2012   HGB 14.0 09/02/2012   HCT 41.8 09/02/2012   PLT 186.0 09/02/2012   GLUCOSE 99 09/02/2012   CHOL 192 09/02/2012   TRIG 46.0 09/02/2012   HDL 54.40 09/02/2012   LDLDIRECT 118.4 06/09/2009   LDLCALC 128* 09/02/2012   ALT 16 09/02/2012   AST 15 09/02/2012   NA 140 09/02/2012   K 3.8 09/02/2012   CL 105 09/02/2012   CREATININE 0.8 09/02/2012   BUN 25* 09/02/2012   CO2 30 09/02/2012   TSH 2.79 09/02/2012   PSA 1.90 09/02/2012   INR 1.0 08/24/2008          Assessment & Plan:

## 2012-09-04 NOTE — Assessment & Plan Note (Signed)
Gluten free trial (no wheat products) x4-6 weeks. OK to use Gluten-free bread and pasta. Milk free trial (no milk, ice cream and yogurt) x4 weeks. OK to use almond or soy milk. 

## 2012-09-04 NOTE — Patient Instructions (Signed)
Gluten free trial (no wheat products) x4-6 weeks. OK to use Gluten-free bread and pasta. Milk free trial (no milk, ice cream and yogurt) x4 weeks. OK to use almond or soy milk. 

## 2012-11-13 ENCOUNTER — Telehealth: Payer: Self-pay | Admitting: *Deleted

## 2012-11-13 MED ORDER — MELOXICAM 7.5 MG PO TABS: 7.5000 mg | ORAL_TABLET | Freq: Two times a day (BID) | ORAL | Status: DC

## 2012-11-13 NOTE — Telephone Encounter (Signed)
Ok Thx 

## 2012-11-13 NOTE — Telephone Encounter (Signed)
Pt called requesting a refill of Mobic 7.5mg  BID instead of once daily.  Please advise

## 2012-11-14 NOTE — Telephone Encounter (Signed)
Left detailed message on pts VM. 

## 2013-03-07 ENCOUNTER — Ambulatory Visit: Payer: BC Managed Care – PPO | Admitting: Internal Medicine

## 2013-03-24 ENCOUNTER — Encounter: Payer: Self-pay | Admitting: Internal Medicine

## 2013-03-24 ENCOUNTER — Ambulatory Visit (INDEPENDENT_AMBULATORY_CARE_PROVIDER_SITE_OTHER): Payer: BC Managed Care – PPO | Admitting: Internal Medicine

## 2013-03-24 VITALS — BP 122/80 | HR 80 | Temp 97.7°F | Resp 16 | Wt 158.0 lb

## 2013-03-24 DIAGNOSIS — M545 Low back pain, unspecified: Secondary | ICD-10-CM

## 2013-03-24 DIAGNOSIS — G35 Multiple sclerosis: Secondary | ICD-10-CM

## 2013-03-24 DIAGNOSIS — Z0000 Encounter for general adult medical examination without abnormal findings: Secondary | ICD-10-CM

## 2013-03-24 DIAGNOSIS — G4700 Insomnia, unspecified: Secondary | ICD-10-CM

## 2013-03-24 NOTE — Progress Notes (Signed)
Pre visit review using our clinic review tool, if applicable. No additional management support is needed unless otherwise documented below in the visit note. 

## 2013-03-24 NOTE — Assessment & Plan Note (Signed)
Continue with current prescription therapy as reflected on the Med list.  

## 2013-03-24 NOTE — Progress Notes (Signed)
   Subjective:    HPI   The patient is here to follow up on chronic MS - not better, he went on [place]  F/u weakness, spasms, headaches and chronic moderate gait imbalance symptoms not controlled with medicines and exercise. Some HAs, fatigue - better. MRI had no new change last time...   Wt Readings from Last 3 Encounters:  03/24/13 158 lb (71.668 kg)  09/04/12 147 lb 2 oz (66.735 kg)  03/04/12 151 lb (68.493 kg)   BP Readings from Last 3 Encounters:  03/24/13 122/80  09/04/12 118/70  03/04/12 128/86     Review of Systems  Constitutional: Negative for appetite change, fatigue and unexpected weight change.  HENT: Negative for congestion, nosebleeds, sneezing, sore throat and trouble swallowing.   Eyes: Negative for itching and visual disturbance.  Respiratory: Negative for cough.   Cardiovascular: Negative for chest pain, palpitations and leg swelling.  Gastrointestinal: Negative for nausea, diarrhea, blood in stool and abdominal distention.  Genitourinary: Negative for frequency and hematuria.  Musculoskeletal: Positive for arthralgias, back pain and gait problem. Negative for joint swelling and neck pain.  Skin: Negative for rash.  Neurological: Negative for dizziness, tremors, speech difficulty and weakness.  Psychiatric/Behavioral: Positive for sleep disturbance. Negative for dysphoric mood and agitation. The patient is not nervous/anxious.   Hand weakness B     Objective:   Physical Exam  Constitutional: He is oriented to person, place, and time. He appears well-developed.  HENT:  Mouth/Throat: Oropharynx is clear and moist.  Eyes: Conjunctivae are normal. Pupils are equal, round, and reactive to light.  Neck: Normal range of motion. No JVD present. No thyromegaly present.  Cardiovascular: Normal rate, regular rhythm, normal heart sounds and intact distal pulses.  Exam reveals no gallop and no friction rub.   No  murmur heard. Pulmonary/Chest: Effort normal and breath sounds normal. No respiratory distress. He has no wheezes. He has no rales. He exhibits no tenderness.  Abdominal: Soft. Bowel sounds are normal. He exhibits mass (L inguinal hernia). He exhibits no distension. There is no tenderness. There is no rebound and no guarding.  Musculoskeletal: Normal range of motion. He exhibits tenderness (LS). He exhibits no edema.  Lymphadenopathy:    He has no cervical adenopathy.  Neurological: He is alert and oriented to person, place, and time. He displays abnormal reflex. No cranial nerve deficit. He exhibits abnormal muscle tone (hyperrefexia). Coordination abnormal.  Skin: Skin is warm and dry. Rash (mild SD) noted.  Psychiatric: He has a normal mood and affect. His behavior is normal. Judgment and thought content normal.  Ataxic No SD rash  Lab Results  Component Value Date   WBC 6.1 09/02/2012   HGB 14.0 09/02/2012   HCT 41.8 09/02/2012   PLT 186.0 09/02/2012   GLUCOSE 99 09/02/2012   CHOL 192 09/02/2012   TRIG 46.0 09/02/2012   HDL 54.40 09/02/2012   LDLDIRECT 118.4 06/09/2009   LDLCALC 128* 09/02/2012   ALT 16 09/02/2012   AST 15 09/02/2012   NA 140 09/02/2012   K 3.8 09/02/2012   CL 105 09/02/2012   CREATININE 0.8 09/02/2012   BUN 25* 09/02/2012   CO2 30 09/02/2012   TSH 2.79 09/02/2012   PSA 1.90 09/02/2012   INR 1.0 08/24/2008          Assessment & Plan:

## 2013-03-25 ENCOUNTER — Encounter: Payer: Self-pay | Admitting: Internal Medicine

## 2013-04-04 ENCOUNTER — Telehealth: Payer: Self-pay | Admitting: *Deleted

## 2013-04-04 NOTE — Telephone Encounter (Signed)
Spouse phoned stating patient needed emergent supply sent to local pharmacy while waiting for mail order delivery.  Last OV with PCP 03/24/13. Med last ordered 11/2012.  Please advise.  CB# [phone]

## 2013-04-07 MED ORDER — MELOXICAM 7.5 MG PO TABS: 7.5000 mg | ORAL_TABLET | Freq: Two times a day (BID) | ORAL | Status: DC

## 2013-04-07 NOTE — Telephone Encounter (Signed)
Confirmed pharmacy with spouse & erx per MD.

## 2013-04-07 NOTE — Telephone Encounter (Signed)
OK. Thx

## 2013-04-08 ENCOUNTER — Telehealth: Payer: Self-pay | Admitting: Internal Medicine

## 2013-04-08 MED ORDER — TRAMADOL HCL ER 100 MG PO TB24: 100.0000 mg | ORAL_TABLET | Freq: Every day | ORAL | Status: DC | PRN

## 2013-04-08 NOTE — Telephone Encounter (Signed)
Ok Thx 

## 2013-04-08 NOTE — Telephone Encounter (Signed)
Pt request refill for Tramadol HCL ER 100 mg [facility], fax [phone]. Please advise.

## 2013-04-09 ENCOUNTER — Other Ambulatory Visit: Payer: Self-pay | Admitting: *Deleted

## 2013-04-09 MED ORDER — MELOXICAM 7.5 MG PO TABS: 7.5000 mg | ORAL_TABLET | Freq: Two times a day (BID) | ORAL | Status: DC

## 2013-04-09 NOTE — Telephone Encounter (Signed)
Rx printed/signed/faxed to pharmacy. Left detailed mess informing pt.

## 2013-09-24 ENCOUNTER — Other Ambulatory Visit (INDEPENDENT_AMBULATORY_CARE_PROVIDER_SITE_OTHER)

## 2013-09-24 ENCOUNTER — Ambulatory Visit (INDEPENDENT_AMBULATORY_CARE_PROVIDER_SITE_OTHER): Admitting: Internal Medicine

## 2013-09-24 ENCOUNTER — Encounter: Payer: Self-pay | Admitting: Internal Medicine

## 2013-09-24 VITALS — BP 122/80 | HR 80 | Temp 98.4°F | Resp 16 | Wt 164.0 lb

## 2013-09-24 DIAGNOSIS — M545 Low back pain, unspecified: Secondary | ICD-10-CM

## 2013-09-24 DIAGNOSIS — E559 Vitamin D deficiency, unspecified: Secondary | ICD-10-CM

## 2013-09-24 DIAGNOSIS — R269 Unspecified abnormalities of gait and mobility: Secondary | ICD-10-CM

## 2013-09-24 DIAGNOSIS — G35 Multiple sclerosis: Secondary | ICD-10-CM

## 2013-09-24 DIAGNOSIS — G4700 Insomnia, unspecified: Secondary | ICD-10-CM

## 2013-09-24 DIAGNOSIS — Z0000 Encounter for general adult medical examination without abnormal findings: Secondary | ICD-10-CM

## 2013-09-24 LAB — BASIC METABOLIC PANEL
BUN: 21 mg/dL (ref 6–23)
CALCIUM: 9.1 mg/dL (ref 8.4–10.5)
CO2: 30 mEq/L (ref 19–32)
CREATININE: 0.9 mg/dL (ref 0.4–1.5)
Chloride: 104 mEq/L (ref 96–112)
GFR: 94.32 mL/min (ref >60.00)
GLUCOSE: 97 mg/dL (ref 70–99)
POTASSIUM: 4.3 meq/L (ref 3.5–5.1)
Sodium: 141 mEq/L (ref 135–145)

## 2013-09-24 LAB — URINALYSIS
Bilirubin Urine: NEGATIVE
Hgb urine dipstick: NEGATIVE
KETONES UR: 40 — AB
Leukocytes, UA: NEGATIVE
Nitrite: NEGATIVE
PH: 6 (ref 5.0–8.0)
SPECIFIC GRAVITY, URINE: 1.02 (ref 1.000–1.030)
TOTAL PROTEIN, URINE-UPE24: NEGATIVE
URINE GLUCOSE: NEGATIVE
Urobilinogen, UA: 0.2 (ref 0.0–1.0)

## 2013-09-24 LAB — HEPATIC FUNCTION PANEL
ALBUMIN: 4.2 g/dL (ref 3.5–5.2)
ALK PHOS: 76 U/L (ref 39–117)
ALT: 16 U/L (ref 0–53)
AST: 15 U/L (ref 0–37)
BILIRUBIN DIRECT: 0.1 mg/dL (ref 0.0–0.3)
BILIRUBIN TOTAL: 0.8 mg/dL (ref 0.2–1.2)
Total Protein: 7.9 g/dL (ref 6.0–8.3)

## 2013-09-24 LAB — CBC WITH DIFFERENTIAL/PLATELET
BASOS ABS: 0 10*3/uL (ref 0.0–0.1)
Basophils Relative: 0.3 % (ref 0.0–3.0)
Eosinophils Absolute: 0.1 10*3/uL (ref 0.0–0.7)
Eosinophils Relative: 1.3 % (ref 0.0–5.0)
HEMATOCRIT: 45.8 % (ref 39.0–52.0)
Hemoglobin: 15.2 g/dL (ref 13.0–17.0)
LYMPHS ABS: 1.4 10*3/uL (ref 0.7–4.0)
Lymphocytes Relative: 20 % (ref 12.0–46.0)
MCHC: 33.3 g/dL (ref 30.0–36.0)
MCV: 91.8 fl (ref 78.0–100.0)
MONO ABS: 0.6 10*3/uL (ref 0.1–1.0)
MONOS PCT: 8.9 % (ref 3.0–12.0)
Neutro Abs: 5 10*3/uL (ref 1.4–7.7)
Neutrophils Relative %: 69.5 % (ref 43.0–77.0)
PLATELETS: 224 10*3/uL (ref 150.0–400.0)
RBC: 4.99 Mil/uL (ref 4.22–5.81)
RDW: 13 % (ref 11.5–15.5)
WBC: 7.2 10*3/uL (ref 4.0–10.5)

## 2013-09-24 LAB — LIPID PANEL
Cholesterol: 227 mg/dL — ABNORMAL HIGH (ref 0–200)
HDL: 55 mg/dL (ref >39.00)
LDL Cholesterol: 152 mg/dL — ABNORMAL HIGH (ref 0–99)
NonHDL: 172
Total CHOL/HDL Ratio: 4
Triglycerides: 102 mg/dL (ref 0.0–149.0)
VLDL: 20.4 mg/dL (ref 0.0–40.0)

## 2013-09-24 LAB — VITAMIN D 25 HYDROXY (VIT D DEFICIENCY, FRACTURES): VITD: 31.41 ng/mL (ref 30.00–100.00)

## 2013-09-24 LAB — VITAMIN B12: Vitamin B-12: 532 pg/mL (ref 211–911)

## 2013-09-24 LAB — TSH: TSH: 1.69 u[IU]/mL (ref 0.35–4.50)

## 2013-09-24 MED ORDER — BACLOFEN 20 MG PO TABS: 20.0000 mg | ORAL_TABLET | Freq: Four times a day (QID) | ORAL | Status: DC | PRN

## 2013-09-24 MED ORDER — TRAMADOL HCL ER 100 MG PO TB24: 100.0000 mg | ORAL_TABLET | Freq: Every day | ORAL | Status: DC | PRN

## 2013-09-24 NOTE — Assessment & Plan Note (Signed)
Potential benefits of a long term Tramadol/opioids use as well as potential risks (i.e. addiction risk, apnea etc) and complications (i.e. Somnolence, constipation and others) were explained to the patient and were aknowledged. On Baclofen

## 2013-09-24 NOTE — Progress Notes (Signed)
   Subjective:    HPI   The patient is here to follow up on chronic MS - not better, he went on [place] in [place]14[place]  F/u weakness, spasms, headaches and chronic moderate gait imbalance symptoms not controlled with medicines and exercise. Some HAs, fatigue - better. MRI had no new change last time...   Wt Readings from Last 3 Encounters:  09/24/13 164 lb (74.39 kg)  03/24/13 158 lb (71.668 kg)  09/04/12 147 lb 2 oz (66.735 kg)   BP Readings from Last 3 Encounters:  09/24/13 122/80  03/24/13 122/80  09/04/12 118/70     Review of Systems  Constitutional: Negative for appetite change, fatigue and unexpected weight change.  HENT: Negative for congestion, nosebleeds, sneezing, sore throat and trouble swallowing.   Eyes: Negative for itching and visual disturbance.  Respiratory: Negative for cough.   Cardiovascular: Negative for chest pain, palpitations and leg swelling.  Gastrointestinal: Negative for nausea, diarrhea, blood in stool and abdominal distention.  Genitourinary: Negative for frequency and hematuria.  Musculoskeletal: Positive for arthralgias, back pain and gait problem. Negative for joint swelling and neck pain.  Skin: Negative for rash.  Neurological: Negative for dizziness, tremors, speech difficulty and weakness.  Psychiatric/Behavioral: Positive for sleep disturbance. Negative for dysphoric mood and agitation. The patient is not nervous/anxious.   Hand weakness B     Objective:   Physical Exam  Constitutional: He is oriented to person, place, and time. He appears well-developed.  HENT:  Mouth/Throat: Oropharynx is clear and moist.  Eyes: Conjunctivae are normal. Pupils are equal, round, and reactive to light.  Neck: Normal range of motion. No JVD present. No thyromegaly present.  Cardiovascular: Normal rate, regular rhythm, normal heart sounds and intact distal pulses.  Exam reveals no gallop and no friction rub.   No  murmur heard. Pulmonary/Chest: Effort normal and breath sounds normal. No respiratory distress. He has no wheezes. He has no rales. He exhibits no tenderness.  Abdominal: Soft. Bowel sounds are normal. He exhibits mass (L inguinal hernia). He exhibits no distension. There is no tenderness. There is no rebound and no guarding.  Musculoskeletal: Normal range of motion. He exhibits tenderness (LS). He exhibits no edema.  Lymphadenopathy:    He has no cervical adenopathy.  Neurological: He is alert and oriented to person, place, and time. He displays abnormal reflex. No cranial nerve deficit. He exhibits abnormal muscle tone (hyperrefexia). Coordination abnormal.  Skin: Skin is warm and dry. Rash (mild SD) noted.  Psychiatric: He has a normal mood and affect. His behavior is normal. Judgment and thought content normal.  Ataxic No SD rash  Lab Results  Component Value Date   WBC 6.1 09/02/2012   HGB 14.0 09/02/2012   HCT 41.8 09/02/2012   PLT 186.0 09/02/2012   GLUCOSE 99 09/02/2012   CHOL 192 09/02/2012   TRIG 46.0 09/02/2012   HDL 54.40 09/02/2012   LDLDIRECT 118.4 06/09/2009   LDLCALC 128* 09/02/2012   ALT 16 09/02/2012   AST 15 09/02/2012   NA 140 09/02/2012   K 3.8 09/02/2012   CL 105 09/02/2012   CREATININE 0.8 09/02/2012   BUN 25* 09/02/2012   CO2 30 09/02/2012   TSH 2.79 09/02/2012   PSA 1.90 09/02/2012   INR 1.0 08/24/2008          Assessment & Plan:

## 2013-09-24 NOTE — Assessment & Plan Note (Signed)
Cane

## 2013-09-24 NOTE — Progress Notes (Signed)
Pre visit review using our clinic review tool, if applicable. No additional management support is needed unless otherwise documented below in the visit note. 

## 2013-10-22 ENCOUNTER — Telehealth: Payer: Self-pay | Admitting: *Deleted

## 2013-10-22 NOTE — Telephone Encounter (Signed)
Pt stated he is needing md to do a quantity limited PA on his tramadol. [facility] [phone]. Inform pt will forward msg to his assistant Stacy to start process...[name]

## 2013-11-05 NOTE — Telephone Encounter (Signed)
PA approved [id number]. 10/06/13- indefinitely

## 2013-11-05 NOTE — Telephone Encounter (Signed)
Pt informed

## 2013-12-04 ENCOUNTER — Telehealth: Payer: Self-pay | Admitting: *Deleted

## 2013-12-04 NOTE — Telephone Encounter (Signed)
Rf req for Tramadol ER 100 mg tabs. 90 day supply. Ok to Rf?   Fax Rx to [phone]

## 2013-12-04 NOTE — Telephone Encounter (Signed)
OK to fill this prescription with additional refills x0 Thank you!  

## 2013-12-05 ENCOUNTER — Other Ambulatory Visit: Payer: Self-pay

## 2013-12-05 MED ORDER — TRAMADOL HCL ER 100 MG PO TB24: 100.0000 mg | ORAL_TABLET | Freq: Every day | ORAL | Status: DC | PRN

## 2013-12-11 NOTE — Telephone Encounter (Signed)
Rx printed

## 2013-12-16 ENCOUNTER — Telehealth: Payer: Self-pay | Admitting: *Deleted

## 2013-12-16 NOTE — Telephone Encounter (Signed)
Left msg on triage requesting refill on his tramadol. [facility]...[name]

## 2013-12-17 MED ORDER — TRAMADOL HCL ER 100 MG PO TB24: 100.0000 mg | ORAL_TABLET | Freq: Every day | ORAL | Status: DC | PRN

## 2013-12-17 NOTE — Telephone Encounter (Signed)
Notified pt [facility]../lmb 

## 2013-12-17 NOTE — Telephone Encounter (Signed)
OK to fill this prescription with additional refills x3 Thank you!  

## 2014-02-16 ENCOUNTER — Telehealth: Payer: Self-pay | Admitting: Internal Medicine

## 2014-02-16 NOTE — Telephone Encounter (Signed)
Medication is not on med list. Pls advise../lmb 

## 2014-02-16 NOTE — Telephone Encounter (Signed)
Pt called in requesting refill on his Tizanidine?  He stated that it is suppose [facility]

## 2014-02-17 MED ORDER — TIZANIDINE HCL 4 MG PO TABS
4.0000 mg | ORAL_TABLET | Freq: Four times a day (QID) | ORAL | Status: DC | PRN
Start: 2014-02-17 — End: 2014-09-25

## 2014-02-17 NOTE — Telephone Encounter (Signed)
OK. Done Thx 

## 2014-02-17 NOTE — Telephone Encounter (Signed)
Pt informed

## 2014-02-23 ENCOUNTER — Other Ambulatory Visit: Payer: Self-pay | Admitting: Internal Medicine

## 2014-03-27 ENCOUNTER — Encounter: Payer: Self-pay | Admitting: Internal Medicine

## 2014-03-27 ENCOUNTER — Other Ambulatory Visit (INDEPENDENT_AMBULATORY_CARE_PROVIDER_SITE_OTHER)

## 2014-03-27 ENCOUNTER — Ambulatory Visit (INDEPENDENT_AMBULATORY_CARE_PROVIDER_SITE_OTHER): Admitting: Internal Medicine

## 2014-03-27 VITALS — BP 118/84 | HR 79 | Temp 98.5°F | Ht 67.0 in | Wt 174.0 lb

## 2014-03-27 DIAGNOSIS — Z0000 Encounter for general adult medical examination without abnormal findings: Secondary | ICD-10-CM

## 2014-03-27 DIAGNOSIS — Z23 Encounter for immunization: Secondary | ICD-10-CM

## 2014-03-27 DIAGNOSIS — G35 Multiple sclerosis: Secondary | ICD-10-CM

## 2014-03-27 LAB — URINALYSIS
BILIRUBIN URINE: NEGATIVE
HGB URINE DIPSTICK: NEGATIVE
Ketones, ur: NEGATIVE
LEUKOCYTES UA: NEGATIVE
NITRITE: NEGATIVE
Specific Gravity, Urine: 1.01 (ref 1.000–1.030)
Total Protein, Urine: NEGATIVE
UROBILINOGEN UA: 0.2 (ref 0.0–1.0)
Urine Glucose: NEGATIVE
pH: 7 (ref 5.0–8.0)

## 2014-03-27 LAB — CBC WITH DIFFERENTIAL/PLATELET
BASOS ABS: 0.1 10*3/uL (ref 0.0–0.1)
Basophils Relative: 0.8 % (ref 0.0–3.0)
Eosinophils Absolute: 0.2 10*3/uL (ref 0.0–0.7)
Eosinophils Relative: 3.1 % (ref 0.0–5.0)
HCT: 44.2 % (ref 39.0–52.0)
Hemoglobin: 15.1 g/dL (ref 13.0–17.0)
Lymphocytes Relative: 18.4 % (ref 12.0–46.0)
Lymphs Abs: 1.3 10*3/uL (ref 0.7–4.0)
MCHC: 34.2 g/dL (ref 30.0–36.0)
MCV: 89.9 fl (ref 78.0–100.0)
Monocytes Absolute: 0.6 10*3/uL (ref 0.1–1.0)
Monocytes Relative: 8.6 % (ref 3.0–12.0)
NEUTROS PCT: 69.1 % (ref 43.0–77.0)
Neutro Abs: 4.7 10*3/uL (ref 1.4–7.7)
PLATELETS: 192 10*3/uL (ref 150.0–400.0)
RBC: 4.91 Mil/uL (ref 4.22–5.81)
RDW: 13.3 % (ref 11.5–15.5)
WBC: 6.9 10*3/uL (ref 4.0–10.5)

## 2014-03-27 LAB — BASIC METABOLIC PANEL
BUN: 18 mg/dL (ref 6–23)
CHLORIDE: 102 meq/L (ref 96–112)
CO2: 30 meq/L (ref 19–32)
CREATININE: 0.81 mg/dL (ref 0.40–1.50)
Calcium: 9.3 mg/dL (ref 8.4–10.5)
GFR: 103.61 mL/min (ref >60.00)
Glucose, Bld: 97 mg/dL (ref 70–99)
POTASSIUM: 4.7 meq/L (ref 3.5–5.1)
Sodium: 138 mEq/L (ref 135–145)

## 2014-03-27 LAB — HEPATIC FUNCTION PANEL
ALBUMIN: 4.2 g/dL (ref 3.5–5.2)
ALT: 14 U/L (ref 0–53)
AST: 14 U/L (ref 0–37)
Alkaline Phosphatase: 72 U/L (ref 39–117)
Bilirubin, Direct: 0.1 mg/dL (ref 0.0–0.3)
TOTAL PROTEIN: 6.9 g/dL (ref 6.0–8.3)
Total Bilirubin: 0.5 mg/dL (ref 0.2–1.2)

## 2014-03-27 LAB — LIPID PANEL
CHOL/HDL RATIO: 4
Cholesterol: 204 mg/dL — ABNORMAL HIGH (ref 0–200)
HDL: 47 mg/dL (ref >39.00)
LDL CALC: 118 mg/dL — AB (ref 0–99)
NONHDL: 157
TRIGLYCERIDES: 197 mg/dL — AB (ref 0.0–149.0)
VLDL: 39.4 mg/dL (ref 0.0–40.0)

## 2014-03-27 LAB — PSA: PSA: 2.09 ng/mL (ref 0.10–4.00)

## 2014-03-27 LAB — TSH: TSH: 3.39 u[IU]/mL (ref 0.35–4.50)

## 2014-03-27 NOTE — Progress Notes (Signed)
Pre visit review using our clinic review tool, if applicable. No additional management support is needed unless otherwise documented below in the visit note. 

## 2014-03-28 NOTE — Assessment & Plan Note (Signed)
We discussed age appropriate health related issues, including available/recomended screening tests and vaccinations. We discussed a need for adhering to healthy diet and exercise. Labs/EKG were reviewed/ordered. All questions were answered.   

## 2014-03-28 NOTE — Assessment & Plan Note (Signed)
Continue with current prescription therapy as reflected on the Med list.  

## 2014-04-05 NOTE — Progress Notes (Addendum)
  Subjective:    Patient ID: [name], male    DOB: May 11, 1955, 59 y.o.   MRN: [id number]  HPI  The patient is here for a wellness exam. The patient has been doing well overall without major physical or psychological issues going on lately except for MS issues. They  Have moved to a new house.  Wt Readings from Last 3 Encounters:  03/27/14 174 lb (78.926 kg)  09/24/13 164 lb (74.39 kg)  03/24/13 158 lb (71.668 kg)   BP Readings from Last 3 Encounters:  03/27/14 118/84  09/24/13 122/80  03/24/13 122/80      Review of Systems  Constitutional: Negative for appetite change, fatigue and unexpected weight change.  HENT: Negative for congestion, nosebleeds, sneezing, sore throat and trouble swallowing.   Eyes: Negative for itching and visual disturbance.  Respiratory: Negative for cough.   Cardiovascular: Negative for chest pain, palpitations and leg swelling.  Gastrointestinal: Negative for nausea, diarrhea, blood in stool and abdominal distention.  Genitourinary: Negative for frequency and hematuria.  Musculoskeletal: Positive for gait problem. Negative for back pain, joint swelling and neck pain.  Skin: Negative for rash.  Neurological: Positive for weakness and numbness. Negative for dizziness, tremors and speech difficulty.  Psychiatric/Behavioral: Negative for sleep disturbance, dysphoric mood and agitation. The patient is not nervous/anxious.        Objective:   Physical Exam  Constitutional: He is oriented to person, place, and time. He appears well-developed.  HENT:  Mouth/Throat: Oropharynx is clear and moist.  Eyes: Conjunctivae are normal. Pupils are equal, round, and reactive to light.  Neck: Normal range of motion. No JVD present. No thyromegaly present.  Cardiovascular: Normal rate, regular rhythm, normal heart sounds and intact distal pulses.  Exam reveals no gallop and no friction rub.   No murmur heard. Pulmonary/Chest: Effort normal and breath sounds  normal. No respiratory distress. He has no wheezes. He has no rales. He exhibits no tenderness.  Abdominal: Soft. Bowel sounds are normal. He exhibits no distension and no mass. There is no tenderness. There is no rebound and no guarding.  Musculoskeletal: Normal range of motion. He exhibits no edema or tenderness.  Ataxic LEs are weak  Lymphadenopathy:    He has no cervical adenopathy.  Neurological: He is alert and oriented to person, place, and time. He has normal reflexes. No cranial nerve deficit. He exhibits normal muscle tone. Coordination normal.  Skin: Skin is warm and dry. No rash noted.  Psychiatric: He has a normal mood and affect. His behavior is normal. Judgment and thought content normal.       Lab Results  Component Value Date   WBC 6.9 03/27/2014   HGB 15.1 03/27/2014   HCT 44.2 03/27/2014   PLT 192.0 03/27/2014   CHOL 204* 03/27/2014   TRIG 197.0* 03/27/2014   HDL 47.00 03/27/2014   LDLDIRECT 118.4 06/09/2009   ALT 14 03/27/2014   AST 14 03/27/2014   NA 138 03/27/2014   K 4.7 03/27/2014   CL 102 03/27/2014   CREATININE 0.81 03/27/2014   BUN 18 03/27/2014   CO2 30 03/27/2014   TSH 3.39 03/27/2014   PSA 2.09 03/27/2014   INR 1.0 08/24/2008      Assessment & Plan:

## 2014-04-27 DIAGNOSIS — IMO0001 Reserved for inherently not codable concepts without codable children: Secondary | ICD-10-CM | POA: Insufficient documentation

## 2014-05-04 ENCOUNTER — Other Ambulatory Visit: Payer: Self-pay | Admitting: Internal Medicine

## 2014-05-15 DIAGNOSIS — J383 Other diseases of vocal cords: Secondary | ICD-10-CM | POA: Insufficient documentation

## 2014-05-15 DIAGNOSIS — R1313 Dysphagia, pharyngeal phase: Secondary | ICD-10-CM | POA: Insufficient documentation

## 2014-05-15 DIAGNOSIS — R1310 Dysphagia, unspecified: Secondary | ICD-10-CM | POA: Insufficient documentation

## 2014-05-21 DIAGNOSIS — K449 Diaphragmatic hernia without obstruction or gangrene: Secondary | ICD-10-CM | POA: Insufficient documentation

## 2014-05-28 ENCOUNTER — Telehealth: Payer: Self-pay | Admitting: Internal Medicine

## 2014-05-28 NOTE — Telephone Encounter (Signed)
Requesting [facility].  Requesting 90 day refill.

## 2014-05-29 ENCOUNTER — Other Ambulatory Visit: Payer: Self-pay | Admitting: *Deleted

## 2014-05-29 MED ORDER — TRAMADOL HCL ER 100 MG PO TB24: 100.0000 mg | ORAL_TABLET | Freq: Every day | ORAL | Status: DC | PRN

## 2014-05-29 NOTE — Telephone Encounter (Signed)
Rx printed/signed/faxed to [facility].

## 2014-05-29 NOTE — Telephone Encounter (Signed)
Ok 3 mo w/1 ref [employer]

## 2014-06-12 ENCOUNTER — Telehealth: Payer: Self-pay | Admitting: Internal Medicine

## 2014-06-12 DIAGNOSIS — K4030 Unilateral inguinal hernia, with obstruction, without gangrene, not specified as recurrent: Secondary | ICD-10-CM

## 2014-06-12 NOTE — Telephone Encounter (Signed)
Christina from [facility] called stating that due to patient having [employer], they need referral from PCP. Patient is scheduled for laparoscopic hernia repair on 06/25/14 with Dr. Demetrio Lapping. Dx code is k57.9

## 2014-06-12 NOTE — Telephone Encounter (Signed)
Done. Thx.

## 2014-09-25 ENCOUNTER — Ambulatory Visit (INDEPENDENT_AMBULATORY_CARE_PROVIDER_SITE_OTHER): Admitting: Internal Medicine

## 2014-09-25 ENCOUNTER — Encounter: Payer: Self-pay | Admitting: Internal Medicine

## 2014-09-25 VITALS — BP 116/80 | HR 73 | Wt 156.0 lb

## 2014-09-25 DIAGNOSIS — K219 Gastro-esophageal reflux disease without esophagitis: Secondary | ICD-10-CM | POA: Insufficient documentation

## 2014-09-25 DIAGNOSIS — K2100 Gastro-esophageal reflux disease with esophagitis, without bleeding: Secondary | ICD-10-CM

## 2014-09-25 DIAGNOSIS — G35 Multiple sclerosis: Secondary | ICD-10-CM | POA: Diagnosis not present

## 2014-09-25 DIAGNOSIS — E559 Vitamin D deficiency, unspecified: Secondary | ICD-10-CM

## 2014-09-25 DIAGNOSIS — M5440 Lumbago with sciatica, unspecified side: Secondary | ICD-10-CM

## 2014-09-25 MED ORDER — MELOXICAM 7.5 MG PO TABS: 7.5000 mg | ORAL_TABLET | Freq: Two times a day (BID) | ORAL | Status: DC

## 2014-09-25 MED ORDER — GABAPENTIN 100 MG PO CAPS: 200.0000 mg | ORAL_CAPSULE | Freq: Three times a day (TID) | ORAL | Status: DC

## 2014-09-25 MED ORDER — PANTOPRAZOLE SODIUM 40 MG PO TBEC: 40.0000 mg | DELAYED_RELEASE_TABLET | Freq: Every day | ORAL | Status: DC

## 2014-09-25 MED ORDER — TIZANIDINE HCL 4 MG PO TABS: 4.0000 mg | ORAL_TABLET | Freq: Four times a day (QID) | ORAL | Status: DC | PRN

## 2014-09-25 MED ORDER — TRAMADOL HCL ER 100 MG PO TB24: 100.0000 mg | ORAL_TABLET | Freq: Every day | ORAL | Status: DC | PRN

## 2014-09-25 MED ORDER — BACLOFEN 20 MG PO TABS: 20.0000 mg | ORAL_TABLET | Freq: Four times a day (QID) | ORAL | Status: DC | PRN

## 2014-09-25 NOTE — Progress Notes (Signed)
Pre visit review using our clinic review tool, if applicable. No additional management support is needed unless otherwise documented below in the visit note. 

## 2014-09-25 NOTE — Assessment & Plan Note (Signed)
Chronic  On Tramadol  Potential benefits of a long term Tramadol/opioids use as well as potential risks (i.e. addiction risk, apnea etc) and complications (i.e. Somnolence, constipation and others) were explained to the patient and were aknowledged. On Baclofen

## 2014-09-25 NOTE — Assessment & Plan Note (Addendum)
Re-started Protonix 8/16  Pt had Nissen's in 5/16 Dr Garner Nash

## 2014-09-25 NOTE — Progress Notes (Signed)
Subjective:  Patient ID: [name], male    DOB: [birth year]  Age: 59 y.o. MRN: [id number]  CC: No chief complaint on file.   HPI [name] presents for MS, Vit D def, LBP f/u. C/o GERD. Wife had THR 6 wks ago - stress. Pt had Nissen's in 5/16 Dr Garner Nash  Outpatient Prescriptions Prior to Visit  Medication Sig Dispense Refill  . amitriptyline (ELAVIL) 25 MG tablet Take 3 tablets (75 mg total) by mouth at bedtime. 270 tablet 3  . baclofen (LIORESAL) 20 MG tablet Take 1 tablet (20 mg total) by mouth 4 (four) times daily as needed. 360 tablet 3  . cetirizine (ZYRTEC) 10 MG tablet Take 10 mg by mouth daily.      . [employer] VITAMIN D3 1000 UNITS capsule TAKE ONE CAPSULE BY MOUTH TWICE DAILY 200 capsule 2  . dalfampridine (AMPYRA) 10 MG TB12 Take 1 tablet (10 mg total) by mouth 2 (two) times daily. 180 tablet 3  . diclofenac sodium (VOLTAREN) 1 % GEL Apply 1 application topically 4 (four) times daily. 300 g 3  . fluticasone (FLONASE) 50 MCG/ACT nasal spray 2 sprays by Nasal route daily.      [name] gabapentin (NEURONTIN) 100 MG capsule Take 2 capsules (200 mg total) by mouth 3 (three) times daily. 540 capsule 3  . meloxicam (MOBIC) 7.5 MG tablet TAKE 1 TABLET TWICE A DAY 180 tablet 1  . Multiple Vitamin (MULTIVITAMIN) capsule Take 1 capsule by mouth daily.      . pantoprazole (PROTONIX) 40 MG tablet Take 1 tablet (40 mg total) by mouth daily. 90 tablet 3  . Silodosin (RAPAFLO) 8 MG CAPS Take by mouth.      [name] tiZANidine (ZANAFLEX) 4 MG tablet Take 1 tablet (4 mg total) by mouth every 6 (six) hours as needed for muscle spasms. 120 tablet 3  . traMADol (ULTRAM-ER) 100 MG 24 hr tablet Take 1-2 tablets (100-200 mg total) by mouth daily as needed for pain. 180 tablet 1   No facility-administered medications prior to visit.    ROS Review of Systems  Constitutional: Positive for fatigue. Negative for appetite change and unexpected weight change.  HENT: Negative for congestion, nosebleeds,  sneezing, sore throat and trouble swallowing.   Eyes: Negative for itching and visual disturbance.  Respiratory: Negative for cough.   Cardiovascular: Negative for chest pain, palpitations and leg swelling.  Gastrointestinal: Negative for nausea, diarrhea, blood in stool and abdominal distention.  Genitourinary: Negative for frequency and hematuria.  Musculoskeletal: Positive for myalgias, arthralgias, gait problem and neck stiffness. Negative for back pain, joint swelling and neck pain.  Skin: Negative for rash.  Neurological: Positive for weakness. Negative for dizziness, tremors and speech difficulty.  Psychiatric/Behavioral: Positive for sleep disturbance and decreased concentration. Negative for suicidal ideas, dysphoric mood and agitation. The patient is not nervous/anxious.     Objective:  BP 116/80 mmHg  Pulse 73  Wt 156 lb (70.761 kg)  SpO2 98%  BP Readings from Last 3 Encounters:  09/25/14 116/80  03/27/14 118/84  09/24/13 122/80    Wt Readings from Last 3 Encounters:  09/25/14 156 lb (70.761 kg)  03/27/14 174 lb (78.926 kg)  09/24/13 164 lb (74.39 kg)    Physical Exam  Constitutional: He is oriented to person, place, and time. He appears well-developed. No distress.  NAD  HENT:  Mouth/Throat: Oropharynx is clear and moist.  Eyes: Conjunctivae are normal. Pupils are equal, round, and reactive to light.  Neck: Normal range  of motion. No JVD present. No thyromegaly present.  Cardiovascular: Normal rate, regular rhythm, normal heart sounds and intact distal pulses.  Exam reveals no gallop and no friction rub.   No murmur heard. Pulmonary/Chest: Effort normal and breath sounds normal. No respiratory distress. He has no wheezes. He has no rales. He exhibits no tenderness.  Abdominal: Soft. Bowel sounds are normal. He exhibits no distension and no mass. There is no tenderness. There is no rebound and no guarding.  Musculoskeletal: Normal range of motion. He exhibits  tenderness. He exhibits no edema.  Lymphadenopathy:    He has no cervical adenopathy.  Neurological: He is alert and oriented to person, place, and time. He displays abnormal reflex. No cranial nerve deficit. He exhibits abnormal muscle tone. He displays a negative Romberg sign. Coordination abnormal. Gait normal.  Skin: Skin is warm and dry. No rash noted.  Psychiatric: He has a normal mood and affect. His behavior is normal. Judgment and thought content normal.    Lab Results  Component Value Date   WBC 6.9 03/27/2014   HGB 15.1 03/27/2014   HCT 44.2 03/27/2014   PLT 192.0 03/27/2014   GLUCOSE 97 03/27/2014   CHOL 204* 03/27/2014   TRIG 197.0* 03/27/2014   HDL 47.00 03/27/2014   LDLDIRECT 118.4 06/09/2009   LDLCALC 118* 03/27/2014   ALT 14 03/27/2014   AST 14 03/27/2014   NA 138 03/27/2014   K 4.7 03/27/2014   CL 102 03/27/2014   CREATININE 0.81 03/27/2014   BUN 18 03/27/2014   CO2 30 03/27/2014   TSH 3.39 03/27/2014   PSA 2.09 03/27/2014   INR 1.0 08/24/2008    Mr Brain W Wo Contrast  12/01/2011   *RADIOLOGY REPORT*  Clinical Data: Multiple sclerosis.  MRI HEAD WITHOUT AND WITH CONTRAST  Technique:  Multiplanar, multiecho pulse sequences of the brain and surrounding structures were obtained according to standard protocol without and with intravenous contrast  Contrast: 75mL MULTIHANCE GADOBENATE DIMEGLUMINE 529 MG/ML IV SOLN  Comparison: 07/04/2007.  Findings: Scattered white matter type changes appear relatively similar to the prior exam consistent with the patient's history of multiple sclerosis without area of enhancement or restricted motion to suggest active demyelination.  No new lesion noted.  No acute infarct.  No intracranial hemorrhage.  No hydrocephalus.  Ectatic intracranial arterial structures.  This ectasia probably causes bulge like appearance of the cavernous segment of the left internal carotid artery which is without change.  Limited for evaluating for aneurysm  without MR angiogram.  No intracranial mass.  IMPRESSION:  Scattered white matter type changes appear relatively similar to the prior exam consistent with the patient's history of multiple sclerosis without area of enhancement or restricted motion to suggest active demyelination.  No new lesion noted.  Prominent ectasia of intracranial vasculature as noted above.   Original Report Authenticated By: Doug Sou, M.D.   Mr Cervical Spine W Wo Contrast  12/01/2011   *RADIOLOGY REPORT*  Clinical Data:  Multiple sclerosis.  Progressive disease.  MRI CERVICAL AND THORACIC SPINE WITHOUT AND WITH CONTRAST  Technique:  Multiplanar and multiecho pulse sequences of the cervical spine, to include the craniocervical junction and cervicothoracic junction, and the thoracic spine, were obtained without and with intravenous contrast.  Contrast: 41mL MULTIHANCE GADOBENATE DIMEGLUMINE 529 MG/ML IV SOLN  Comparison:  Lumbar spine MRI 10/07/2010.  MRI CERVICAL SPINE  Findings:  Cervical spinal alignment anatomic.  There is no significant cervical stenosis.  Cervical cord demonstrates areas of high  signal compatible with prior demyelinating disease, cervical cord demonstrates areas of demyelinating disease with high T2 and inversion recovery signal but no enhancing lesions.  This is compatible with prior episodes of demyelinating disease in this patient with history of multiple sclerosis.  Flow voids are present in both vertebral arteries.  The paraspinal soft tissues are within normal limits.  The marrow signal is normal.  There is no cord swelling or edema to suggest active demyelination.  No focal atrophy of the cord.  There is shallow disc bulge at C5-C6 without significant stenosis. The other levels appear within normal limits.  IMPRESSION: 1.  No evidence of active demyelinating lesions. 2.  Multiple areas of high signal in the cervical cord compatible with gliosis from prior demyelinating lesions, compatible from multiple  sclerosis. 3.  Minimal degenerative disease without stenosis.  MRI THORACIC SPINE  Findings: Thoracic spinal alignment is anatomic.  Marrow signal is within normal limits.  Vertebral body height is preserved. Inversion recovery images demonstrate heterogeneous cord signal with.  Increased T2 signal compatible with prior demyelinating disease.  There is no enhancement or inflammation of the cord.  No cord swelling to suggest acute exacerbation of multiple sclerosis. These areas are present dorsal to T5-T6 and T9.  Mild thoracic spine degenerative disease is present.  T1-T2:  Negative.  T2-T3:  Negative.  T3-T4:  Small right paracentral disc protrusion is present.  This indents the ventral aspect of the thoracic cord.  T4-T5:  Negative.  T5-T6:  Tiny left paracentral disc protrusion just contacting the ventral aspect of the thoracic cord.  Mild flattening.  T6-T7:  Negative.  T7-T8:  Small central disc protrusion indenting the ventral thoracic cord.  T8-T9:  Small central disc protrusion flattening the ventral thoracic cord.  T9-T10:  Negative.  T10-T11:  Negative.  T11-T12:  Mild facet arthrosis.  No stenosis.  T12-L1:  Negative.  IMPRESSION: 1.  No evidence of active demyelinating lesions.  Scattered areas of high T2 signal are compatible with gliosis in the areas of prior demyelinating disease associated with multiple sclerosis. 2.  Mild thoracic spondylosis described above.   Original Report Authenticated By: Dereck Ligas, M.D.   Mr Thoracic Spine W Wo Contrast  12/01/2011   *RADIOLOGY REPORT*  Clinical Data:  Multiple sclerosis.  Progressive disease.  MRI CERVICAL AND THORACIC SPINE WITHOUT AND WITH CONTRAST  Technique:  Multiplanar and multiecho pulse sequences of the cervical spine, to include the craniocervical junction and cervicothoracic junction, and the thoracic spine, were obtained without and with intravenous contrast.  Contrast: 58mL MULTIHANCE GADOBENATE DIMEGLUMINE 529 MG/ML IV SOLN  Comparison:   Lumbar spine MRI 10/07/2010.  MRI CERVICAL SPINE  Findings:  Cervical spinal alignment anatomic.  There is no significant cervical stenosis.  Cervical cord demonstrates areas of high signal compatible with prior demyelinating disease, cervical cord demonstrates areas of demyelinating disease with high T2 and inversion recovery signal but no enhancing lesions.  This is compatible with prior episodes of demyelinating disease in this patient with history of multiple sclerosis.  Flow voids are present in both vertebral arteries.  The paraspinal soft tissues are within normal limits.  The marrow signal is normal.  There is no cord swelling or edema to suggest active demyelination.  No focal atrophy of the cord.  There is shallow disc bulge at C5-C6 without significant stenosis. The other levels appear within normal limits.  IMPRESSION: 1.  No evidence of active demyelinating lesions. 2.  Multiple areas of high signal in the  cervical cord compatible with gliosis from prior demyelinating lesions, compatible from multiple sclerosis. 3.  Minimal degenerative disease without stenosis.  MRI THORACIC SPINE  Findings: Thoracic spinal alignment is anatomic.  Marrow signal is within normal limits.  Vertebral body height is preserved. Inversion recovery images demonstrate heterogeneous cord signal with.  Increased T2 signal compatible with prior demyelinating disease.  There is no enhancement or inflammation of the cord.  No cord swelling to suggest acute exacerbation of multiple sclerosis. These areas are present dorsal to T5-T6 and T9.  Mild thoracic spine degenerative disease is present.  T1-T2:  Negative.  T2-T3:  Negative.  T3-T4:  Small right paracentral disc protrusion is present.  This indents the ventral aspect of the thoracic cord.  T4-T5:  Negative.  T5-T6:  Tiny left paracentral disc protrusion just contacting the ventral aspect of the thoracic cord.  Mild flattening.  T6-T7:  Negative.  T7-T8:  Small central disc  protrusion indenting the ventral thoracic cord.  T8-T9:  Small central disc protrusion flattening the ventral thoracic cord.  T9-T10:  Negative.  T10-T11:  Negative.  T11-T12:  Mild facet arthrosis.  No stenosis.  T12-L1:  Negative.  IMPRESSION: 1.  No evidence of active demyelinating lesions.  Scattered areas of high T2 signal are compatible with gliosis in the areas of prior demyelinating disease associated with multiple sclerosis. 2.  Mild thoracic spondylosis described above.   Original Report Authenticated By: Dereck Ligas, M.D.    Assessment & Plan:   There are no diagnoses linked to this encounter. I am having Mr. Percell Miller maintain his fluticasone, multivitamin, silodosin, cetirizine, pantoprazole, diclofenac sodium, [employer]S VITAMIN D3, amitriptyline, dalfampridine, gabapentin, baclofen, tiZANidine, meloxicam, traMADol, and tamsulosin.  Meds ordered this encounter  Medications  . tamsulosin (FLOMAX) 0.4 MG CAPS capsule    Sig: Take 0.4 mg by mouth as needed.     Follow-up: No Follow-up on file.  Walker Kehr, MD

## 2014-09-25 NOTE — Assessment & Plan Note (Signed)
On Ampira 2016 Potential benefits of a long term Tramadol/opioids use as well as potential risks (i.e. addiction risk, apnea etc) and complications (i.e. Somnolence, constipation and others) were explained to the patient and were aknowledged. On Baclofen

## 2014-09-25 NOTE — Assessment & Plan Note (Signed)
On Vit D 

## 2015-03-11 ENCOUNTER — Other Ambulatory Visit: Payer: Self-pay | Admitting: Internal Medicine

## 2015-03-22 ENCOUNTER — Telehealth: Payer: Self-pay | Admitting: Internal Medicine

## 2015-03-22 DIAGNOSIS — Z125 Encounter for screening for malignant neoplasm of prostate: Secondary | ICD-10-CM

## 2015-03-22 DIAGNOSIS — Z0000 Encounter for general adult medical examination without abnormal findings: Secondary | ICD-10-CM

## 2015-03-22 NOTE — Telephone Encounter (Signed)
Pt is requesting to do his labs before his appointment on Mon 8/20. Please advise

## 2015-03-24 NOTE — Telephone Encounter (Signed)
TSH PSA CMET UA CBC lipids Thx

## 2015-03-25 NOTE — Addendum Note (Signed)
Addended by: Earnstine Regal on: 03/25/2015 09:03 AM   Modules accepted: Orders

## 2015-03-25 NOTE — Telephone Encounter (Signed)
Called pt/wife no answer LMOM md ok labs prior to appt. Labs already place in epic...[name]

## 2015-03-25 NOTE — Telephone Encounter (Signed)
Received call pt is wanting to verify msg that was left this morning. She states husband is schedule to see md on Feb 20th not Aug 20th. Advise wife he is still ok to do labs prior, and they have already been entered...Johny Chess

## 2015-03-26 ENCOUNTER — Other Ambulatory Visit (INDEPENDENT_AMBULATORY_CARE_PROVIDER_SITE_OTHER): Payer: Medicare Other

## 2015-03-26 DIAGNOSIS — Z125 Encounter for screening for malignant neoplasm of prostate: Secondary | ICD-10-CM | POA: Diagnosis not present

## 2015-03-26 DIAGNOSIS — Z0000 Encounter for general adult medical examination without abnormal findings: Secondary | ICD-10-CM | POA: Diagnosis not present

## 2015-03-26 LAB — URINALYSIS, ROUTINE W REFLEX MICROSCOPIC
BILIRUBIN URINE: NEGATIVE
Hgb urine dipstick: NEGATIVE
Ketones, ur: NEGATIVE
LEUKOCYTES UA: NEGATIVE
Nitrite: NEGATIVE
PH: 7 (ref 5.0–8.0)
RBC / HPF: NONE SEEN (ref 0–?)
Specific Gravity, Urine: <=1.005 — AB (ref 1.000–1.030)
Total Protein, Urine: NEGATIVE
Urine Glucose: NEGATIVE
Urobilinogen, UA: 0.2 (ref 0.0–1.0)
WBC, UA: NONE SEEN (ref 0–?)

## 2015-03-26 LAB — BASIC METABOLIC PANEL
BUN: 11 mg/dL (ref 6–23)
CO2: 31 mEq/L (ref 19–32)
CREATININE: 0.86 mg/dL (ref 0.40–1.50)
Calcium: 9.5 mg/dL (ref 8.4–10.5)
Chloride: 102 mEq/L (ref 96–112)
GFR: 96.37 mL/min (ref >60.00)
Glucose, Bld: 100 mg/dL — ABNORMAL HIGH (ref 70–99)
POTASSIUM: 4.5 meq/L (ref 3.5–5.1)
Sodium: 141 mEq/L (ref 135–145)

## 2015-03-26 LAB — CBC WITH DIFFERENTIAL/PLATELET
BASOS ABS: 0 10*3/uL (ref 0.0–0.1)
Basophils Relative: 0.6 % (ref 0.0–3.0)
EOS ABS: 0.1 10*3/uL (ref 0.0–0.7)
EOS PCT: 1.4 % (ref 0.0–5.0)
HEMATOCRIT: 43.7 % (ref 39.0–52.0)
HEMOGLOBIN: 14.7 g/dL (ref 13.0–17.0)
LYMPHS PCT: 20 % (ref 12.0–46.0)
Lymphs Abs: 1.4 10*3/uL (ref 0.7–4.0)
MCHC: 33.6 g/dL (ref 30.0–36.0)
MCV: 90.9 fl (ref 78.0–100.0)
Monocytes Absolute: 0.5 10*3/uL (ref 0.1–1.0)
Monocytes Relative: 6.8 % (ref 3.0–12.0)
Neutro Abs: 5 10*3/uL (ref 1.4–7.7)
Neutrophils Relative %: 71.2 % (ref 43.0–77.0)
PLATELETS: 193 10*3/uL (ref 150.0–400.0)
RBC: 4.8 Mil/uL (ref 4.22–5.81)
RDW: 12.7 % (ref 11.5–15.5)
WBC: 7.1 10*3/uL (ref 4.0–10.5)

## 2015-03-26 LAB — HEPATIC FUNCTION PANEL
ALK PHOS: 74 U/L (ref 39–117)
ALT: 13 U/L (ref 0–53)
AST: 11 U/L (ref 0–37)
Albumin: 4.3 g/dL (ref 3.5–5.2)
BILIRUBIN DIRECT: 0.1 mg/dL (ref 0.0–0.3)
TOTAL PROTEIN: 6.9 g/dL (ref 6.0–8.3)
Total Bilirubin: 0.6 mg/dL (ref 0.2–1.2)

## 2015-03-26 LAB — LIPID PANEL
CHOL/HDL RATIO: 4
Cholesterol: 177 mg/dL (ref 0–200)
HDL: 46 mg/dL (ref >39.00)
LDL CALC: 99 mg/dL (ref 0–99)
NONHDL: 130.68
TRIGLYCERIDES: 158 mg/dL — AB (ref 0.0–149.0)
VLDL: 31.6 mg/dL (ref 0.0–40.0)

## 2015-03-26 LAB — TSH: TSH: 2.35 u[IU]/mL (ref 0.35–4.50)

## 2015-03-26 LAB — PSA: PSA: 1.5 ng/mL (ref 0.10–4.00)

## 2015-03-28 ENCOUNTER — Other Ambulatory Visit: Payer: Self-pay | Admitting: Internal Medicine

## 2015-03-29 ENCOUNTER — Encounter: Payer: Self-pay | Admitting: Internal Medicine

## 2015-03-29 ENCOUNTER — Ambulatory Visit (INDEPENDENT_AMBULATORY_CARE_PROVIDER_SITE_OTHER): Payer: Medicare Other | Admitting: Internal Medicine

## 2015-03-29 VITALS — BP 120/88 | HR 74 | Ht 67.0 in | Wt 160.0 lb

## 2015-03-29 DIAGNOSIS — Z0000 Encounter for general adult medical examination without abnormal findings: Secondary | ICD-10-CM

## 2015-03-29 DIAGNOSIS — G35 Multiple sclerosis: Secondary | ICD-10-CM

## 2015-03-29 DIAGNOSIS — Z23 Encounter for immunization: Secondary | ICD-10-CM

## 2015-03-29 DIAGNOSIS — K219 Gastro-esophageal reflux disease without esophagitis: Secondary | ICD-10-CM | POA: Diagnosis not present

## 2015-03-29 MED ORDER — TRAMADOL HCL ER 100 MG PO TB24: 100.0000 mg | ORAL_TABLET | Freq: Every day | ORAL | Status: DC | PRN

## 2015-03-29 NOTE — Assessment & Plan Note (Addendum)
We discussed age appropriate health related issues, including available/recomended screening tests and vaccinations. We discussed a need for adhering to healthy diet and exercise. Labs/EKG were reviewed/ordered. All questions were answered. Dr Shelia Media said it is ok to get a Zostavax

## 2015-03-29 NOTE — Progress Notes (Signed)
Subjective:  Patient ID: [name], male    DOB: Jun 20, 1955  Age: 60 y.o. MRN: [id number]  CC: Annual Exam   HPI [name] presents for a well exam  Outpatient Prescriptions Prior to Visit  Medication Sig Dispense Refill  . amitriptyline (ELAVIL) 25 MG tablet Take 3 tablets (75 mg total) by mouth at bedtime. 270 tablet 3  . baclofen (LIORESAL) 20 MG tablet TAKE 1 TABLET FOUR TIMES A DAY AS NEEDED 360 tablet 1  . cetirizine (ZYRTEC) 10 MG tablet Take 10 mg by mouth daily.      . [facility] VITAMIN D3 1000 UNITS capsule TAKE ONE CAPSULE BY MOUTH TWICE DAILY 200 capsule 2  . dalfampridine (AMPYRA) 10 MG TB12 Take 1 tablet (10 mg total) by mouth 2 (two) times daily. 180 tablet 3  . diclofenac sodium (VOLTAREN) 1 % GEL Apply 1 application topically 4 (four) times daily. 300 g 3  . fluticasone (FLONASE) 50 MCG/ACT nasal spray 2 sprays by Nasal route daily.      [name] gabapentin (NEURONTIN) 100 MG capsule Take 2 capsules (200 mg total) by mouth 3 (three) times daily. 540 capsule 3  . meloxicam (MOBIC) 7.5 MG tablet TAKE 1 TABLET TWICE A DAY 180 tablet 1  . Multiple Vitamin (MULTIVITAMIN) capsule Take 1 capsule by mouth daily.      . pantoprazole (PROTONIX) 40 MG tablet Take 1 tablet (40 mg total) by mouth daily. 90 tablet 3  . Silodosin (RAPAFLO) 8 MG CAPS Take by mouth.      . tamsulosin (FLOMAX) 0.4 MG CAPS capsule Take 0.4 mg by mouth as needed.    [name] tiZANidine (ZANAFLEX) 4 MG tablet Take 1 tablet (4 mg total) by mouth every 6 (six) hours as needed for muscle spasms. 120 tablet 3  . traMADol (ULTRAM-ER) 100 MG 24 hr tablet Take 1-2 tablets (100-200 mg total) by mouth daily as needed for pain. 180 tablet 1   No facility-administered medications prior to visit.    ROS Review of Systems  Constitutional: Negative for appetite change, fatigue and unexpected weight change.  HENT: Negative for congestion, nosebleeds, sneezing, sore throat and trouble swallowing.   Eyes: Negative for itching  and visual disturbance.  Respiratory: Negative for cough.   Cardiovascular: Negative for chest pain, palpitations and leg swelling.  Gastrointestinal: Negative for nausea, diarrhea, blood in stool and abdominal distention.  Genitourinary: Negative for frequency and hematuria.  Musculoskeletal: Positive for gait problem. Negative for back pain, joint swelling and neck pain.  Skin: Negative for rash and wound.  Neurological: Positive for weakness. Negative for dizziness, tremors and speech difficulty.  Psychiatric/Behavioral: Negative for sleep disturbance, dysphoric mood and agitation. The patient is not nervous/anxious.     Objective:  BP 120/88 mmHg  Pulse 74  Ht 5\' 7"  (1.702 m)  Wt 160 lb (72.576 kg)  BMI 25.05 kg/m2  SpO2 95%  BP Readings from Last 3 Encounters:  03/29/15 120/88  09/25/14 116/80  03/27/14 118/84    Wt Readings from Last 3 Encounters:  03/29/15 160 lb (72.576 kg)  09/25/14 156 lb (70.761 kg)  03/27/14 174 lb (78.926 kg)    Physical Exam  Constitutional: He is oriented to person, place, and time. He appears well-developed. No distress.  NAD  HENT:  Mouth/Throat: Oropharynx is clear and moist.  Eyes: Conjunctivae are normal. Pupils are equal, round, and reactive to light.  Neck: Normal range of motion. No JVD present. No thyromegaly present.  Cardiovascular: Normal rate, regular  rhythm, normal heart sounds and intact distal pulses.  Exam reveals no gallop and no friction rub.   No murmur heard. Pulmonary/Chest: Effort normal and breath sounds normal. No respiratory distress. He has no wheezes. He has no rales. He exhibits no tenderness.  Abdominal: Soft. Bowel sounds are normal. He exhibits no distension and no mass. There is no tenderness. There is no rebound and no guarding.  Musculoskeletal: Normal range of motion. He exhibits no edema or tenderness.  Lymphadenopathy:    He has no cervical adenopathy.  Neurological: He is alert and oriented to person,  place, and time. He has normal reflexes. No cranial nerve deficit. He exhibits normal muscle tone. He displays a negative Romberg sign. Coordination abnormal. Gait normal.  Skin: Skin is warm and dry. No rash noted.  Psychiatric: He has a normal mood and affect. His behavior is normal. Judgment and thought content normal.  Cane Weak LEs  Lab Results  Component Value Date   WBC 7.1 03/26/2015   HGB 14.7 03/26/2015   HCT 43.7 03/26/2015   PLT 193.0 03/26/2015   GLUCOSE 100* 03/26/2015   CHOL 177 03/26/2015   TRIG 158.0* 03/26/2015   HDL 46.00 03/26/2015   LDLDIRECT 118.4 06/09/2009   LDLCALC 99 03/26/2015   ALT 13 03/26/2015   AST 11 03/26/2015   NA 141 03/26/2015   K 4.5 03/26/2015   CL 102 03/26/2015   CREATININE 0.86 03/26/2015   BUN 11 03/26/2015   CO2 31 03/26/2015   TSH 2.35 03/26/2015   PSA 1.50 03/26/2015   INR 1.0 08/24/2008    Mr Brain W Wo Contrast  12/01/2011  *RADIOLOGY REPORT* Clinical Data: Multiple sclerosis. MRI HEAD WITHOUT AND WITH CONTRAST Technique:  Multiplanar, multiecho pulse sequences of the brain and surrounding structures were obtained according to standard protocol without and with intravenous contrast Contrast: 25mL MULTIHANCE GADOBENATE DIMEGLUMINE 529 MG/ML IV SOLN Comparison: 07/04/2007. Findings: Scattered white matter type changes appear relatively similar to the prior exam consistent with the patient's history of multiple sclerosis without area of enhancement or restricted motion to suggest active demyelination.  No new lesion noted. No acute infarct. No intracranial hemorrhage. No hydrocephalus. Ectatic intracranial arterial structures.  This ectasia probably causes bulge like appearance of the cavernous segment of the left internal carotid artery which is without change.  Limited for evaluating for aneurysm without MR angiogram. No intracranial mass. IMPRESSION:  Scattered white matter type changes appear relatively similar to the prior exam  consistent with the patient's history of multiple sclerosis without area of enhancement or restricted motion to suggest active demyelination.  No new lesion noted. Prominent ectasia of intracranial vasculature as noted above. Original Report Authenticated By: Doug Sou, M.D.   Mr Cervical Spine W Wo Contrast  12/01/2011  *RADIOLOGY REPORT* Clinical Data:  Multiple sclerosis.  Progressive disease. MRI CERVICAL AND THORACIC SPINE WITHOUT AND WITH CONTRAST Technique:  Multiplanar and multiecho pulse sequences of the cervical spine, to include the craniocervical junction and cervicothoracic junction, and the thoracic spine, were obtained without and with intravenous contrast. Contrast: 59mL MULTIHANCE GADOBENATE DIMEGLUMINE 529 MG/ML IV SOLN Comparison:  Lumbar spine MRI 10/07/2010. MRI CERVICAL SPINE Findings:  Cervical spinal alignment anatomic.  There is no significant cervical stenosis.  Cervical cord demonstrates areas of high signal compatible with prior demyelinating disease, cervical cord demonstrates areas of demyelinating disease with high T2 and inversion recovery signal but no enhancing lesions.  This is compatible with prior episodes of demyelinating disease in this  patient with history of multiple sclerosis.  Flow voids are present in both vertebral arteries.  The paraspinal soft tissues are within normal limits.  The marrow signal is normal.  There is no cord swelling or edema to suggest active demyelination.  No focal atrophy of the cord. There is shallow disc bulge at C5-C6 without significant stenosis. The other levels appear within normal limits. IMPRESSION: 1.  No evidence of active demyelinating lesions. 2.  Multiple areas of high signal in the cervical cord compatible with gliosis from prior demyelinating lesions, compatible from multiple sclerosis. 3.  Minimal degenerative disease without stenosis. MRI THORACIC SPINE Findings: Thoracic spinal alignment is anatomic.  Marrow signal is  within normal limits.  Vertebral body height is preserved. Inversion recovery images demonstrate heterogeneous cord signal with.  Increased T2 signal compatible with prior demyelinating disease.  There is no enhancement or inflammation of the cord.  No cord swelling to suggest acute exacerbation of multiple sclerosis. These areas are present dorsal to T5-T6 and T9.  Mild thoracic spine degenerative disease is present. T1-T2:  Negative. T2-T3:  Negative. T3-T4:  Small right paracentral disc protrusion is present.  This indents the ventral aspect of the thoracic cord. T4-T5:  Negative. T5-T6:  Tiny left paracentral disc protrusion just contacting the ventral aspect of the thoracic cord.  Mild flattening. T6-T7:  Negative. T7-T8:  Small central disc protrusion indenting the ventral thoracic cord. T8-T9:  Small central disc protrusion flattening the ventral thoracic cord. T9-T10:  Negative. T10-T11:  Negative. T11-T12:  Mild facet arthrosis.  No stenosis. T12-L1:  Negative. IMPRESSION: 1.  No evidence of active demyelinating lesions.  Scattered areas of high T2 signal are compatible with gliosis in the areas of prior demyelinating disease associated with multiple sclerosis. 2.  Mild thoracic spondylosis described above. Original Report Authenticated By: Dereck Ligas, M.D.   Mr Thoracic Spine W Wo Contrast  12/01/2011  *RADIOLOGY REPORT* Clinical Data:  Multiple sclerosis.  Progressive disease. MRI CERVICAL AND THORACIC SPINE WITHOUT AND WITH CONTRAST Technique:  Multiplanar and multiecho pulse sequences of the cervical spine, to include the craniocervical junction and cervicothoracic junction, and the thoracic spine, were obtained without and with intravenous contrast. Contrast: 39mL MULTIHANCE GADOBENATE DIMEGLUMINE 529 MG/ML IV SOLN Comparison:  Lumbar spine MRI 10/07/2010. MRI CERVICAL SPINE Findings:  Cervical spinal alignment anatomic.  There is no significant cervical stenosis.  Cervical cord demonstrates  areas of high signal compatible with prior demyelinating disease, cervical cord demonstrates areas of demyelinating disease with high T2 and inversion recovery signal but no enhancing lesions.  This is compatible with prior episodes of demyelinating disease in this patient with history of multiple sclerosis.  Flow voids are present in both vertebral arteries.  The paraspinal soft tissues are within normal limits.  The marrow signal is normal.  There is no cord swelling or edema to suggest active demyelination.  No focal atrophy of the cord. There is shallow disc bulge at C5-C6 without significant stenosis. The other levels appear within normal limits. IMPRESSION: 1.  No evidence of active demyelinating lesions. 2.  Multiple areas of high signal in the cervical cord compatible with gliosis from prior demyelinating lesions, compatible from multiple sclerosis. 3.  Minimal degenerative disease without stenosis. MRI THORACIC SPINE Findings: Thoracic spinal alignment is anatomic.  Marrow signal is within normal limits.  Vertebral body height is preserved. Inversion recovery images demonstrate heterogeneous cord signal with.  Increased T2 signal compatible with prior demyelinating disease.  There is no enhancement or  inflammation of the cord.  No cord swelling to suggest acute exacerbation of multiple sclerosis. These areas are present dorsal to T5-T6 and T9.  Mild thoracic spine degenerative disease is present. T1-T2:  Negative. T2-T3:  Negative. T3-T4:  Small right paracentral disc protrusion is present.  This indents the ventral aspect of the thoracic cord. T4-T5:  Negative. T5-T6:  Tiny left paracentral disc protrusion just contacting the ventral aspect of the thoracic cord.  Mild flattening. T6-T7:  Negative. T7-T8:  Small central disc protrusion indenting the ventral thoracic cord. T8-T9:  Small central disc protrusion flattening the ventral thoracic cord. T9-T10:  Negative. T10-T11:  Negative. T11-T12:  Mild facet  arthrosis.  No stenosis. T12-L1:  Negative. IMPRESSION: 1.  No evidence of active demyelinating lesions.  Scattered areas of high T2 signal are compatible with gliosis in the areas of prior demyelinating disease associated with multiple sclerosis. 2.  Mild thoracic spondylosis described above. Original Report Authenticated By: Dereck Ligas, M.D.    Assessment & Plan:   [name] was seen today for annual exam.  Diagnoses and all orders for this visit:  Well adult exam  Multiple sclerosis ([place])  Gastroesophageal reflux disease without esophagitis  Need for shingles vaccine -     Varicella-zoster vaccine subcutaneous  Other orders -     traMADol (ULTRAM-ER) 100 MG 24 hr tablet; Take 1-2 tablets (100-200 mg total) by mouth daily as needed for pain.   I am having [name] maintain his fluticasone, multivitamin, silodosin, cetirizine, diclofenac sodium, [facility]S VITAMIN D3, amitriptyline, dalfampridine, tamsulosin, gabapentin, tiZANidine, pantoprazole, baclofen, meloxicam, and traMADol.  Meds ordered this encounter  Medications  . traMADol (ULTRAM-ER) 100 MG 24 hr tablet    Sig: Take 1-2 tablets (100-200 mg total) by mouth daily as needed for pain.    Dispense:  180 tablet    Refill:  1     Follow-up: Return in about 6 months (around 09/26/2015) for a follow-up visit.  Walker Kehr, MD

## 2015-03-29 NOTE — Assessment & Plan Note (Signed)
Re-started Protonix 8/16  Pt had Nissen's in 5/16 Dr Westcott  

## 2015-03-29 NOTE — Assessment & Plan Note (Signed)
Slow progression since since 2007- 2009 On Ampira 2016 Potential benefits of a long term Tramadol/opioids use as well as potential risks (i.e. addiction risk, apnea etc) and complications (i.e. Somnolence, constipation and others) were explained to the patient and were aknowledged. On Baclofen

## 2015-03-29 NOTE — Progress Notes (Signed)
Pre visit review using our clinic review tool, if applicable. No additional management support is needed unless otherwise documented below in the visit note. 

## 2015-04-07 ENCOUNTER — Telehealth: Payer: Self-pay | Admitting: Internal Medicine

## 2015-04-07 NOTE — Telephone Encounter (Signed)
Per spouse - letter of medical necessity is requested due to cost of ER tablets.Per spouse, pt cannot take IR tablet due to side effects.  Letter of medication necessity was denied, insurance company advised that ER capsule are more cost effective than tablets.   Okay to change Rx to capsules instead of tablets?

## 2015-04-07 NOTE — Telephone Encounter (Signed)
Patient's wife called to advise that PA is needed for refill of traMADol (ULTRAM-ER) 100 MG 24 hr tablet [id number] . She provided a phone # of [phone].

## 2015-04-07 NOTE — Telephone Encounter (Signed)
OK ER caps Thx

## 2015-04-08 MED ORDER — TRAMADOL HCL (ER BIPHASIC) 100 MG PO CP24: 1.0000 | ORAL_CAPSULE | Freq: Two times a day (BID) | ORAL | Status: DC | PRN

## 2015-04-08 NOTE — Telephone Encounter (Signed)
Pt's medication list updated (refill NOT sent). Spouse advised to call back when refill is due so we can sent new Rx for capsules for pt to try (not sure he can swallow capsules).

## 2015-04-08 NOTE — Telephone Encounter (Signed)
Left message on machine for pt to return my call  

## 2015-05-05 DIAGNOSIS — Z0289 Encounter for other administrative examinations: Secondary | ICD-10-CM

## 2015-05-13 DIAGNOSIS — L821 Other seborrheic keratosis: Secondary | ICD-10-CM | POA: Diagnosis not present

## 2015-05-13 DIAGNOSIS — D1801 Hemangioma of skin and subcutaneous tissue: Secondary | ICD-10-CM | POA: Diagnosis not present

## 2015-05-13 DIAGNOSIS — L814 Other melanin hyperpigmentation: Secondary | ICD-10-CM | POA: Diagnosis not present

## 2015-05-13 DIAGNOSIS — L219 Seborrheic dermatitis, unspecified: Secondary | ICD-10-CM | POA: Diagnosis not present

## 2015-05-13 DIAGNOSIS — D225 Melanocytic nevi of trunk: Secondary | ICD-10-CM | POA: Diagnosis not present

## 2015-05-20 DIAGNOSIS — L602 Onychogryphosis: Secondary | ICD-10-CM | POA: Diagnosis not present

## 2015-05-20 DIAGNOSIS — M21372 Foot drop, left foot: Secondary | ICD-10-CM | POA: Diagnosis not present

## 2015-05-20 DIAGNOSIS — G35 Multiple sclerosis: Secondary | ICD-10-CM | POA: Diagnosis not present

## 2015-05-23 ENCOUNTER — Encounter: Payer: Self-pay | Admitting: Internal Medicine

## 2015-05-24 ENCOUNTER — Encounter: Payer: Self-pay | Admitting: Internal Medicine

## 2015-05-24 ENCOUNTER — Other Ambulatory Visit: Payer: Self-pay | Admitting: *Deleted

## 2015-05-24 MED ORDER — CLOTRIMAZOLE-BETAMETHASONE 1-0.05 % EX CREA: TOPICAL_CREAM | Freq: Two times a day (BID) | CUTANEOUS | Status: DC

## 2015-05-24 NOTE — Telephone Encounter (Signed)
Hep C screening completed, not able to add in [facility]. Requested pt to send a copy.

## 2015-06-01 DIAGNOSIS — Z888 Allergy status to other drugs, medicaments and biological substances status: Secondary | ICD-10-CM | POA: Diagnosis not present

## 2015-06-01 DIAGNOSIS — G8929 Other chronic pain: Secondary | ICD-10-CM | POA: Diagnosis not present

## 2015-06-01 DIAGNOSIS — R252 Cramp and spasm: Secondary | ICD-10-CM | POA: Diagnosis not present

## 2015-06-01 DIAGNOSIS — G35 Multiple sclerosis: Secondary | ICD-10-CM | POA: Diagnosis not present

## 2015-07-12 ENCOUNTER — Other Ambulatory Visit: Payer: Self-pay

## 2015-07-12 NOTE — Telephone Encounter (Signed)
Pt's spouse called and left a message on my voicemail requesting a refill of pt's Tramadol to mail order - [facility].

## 2015-07-14 NOTE — Telephone Encounter (Signed)
pts wife has called back regarding this being [facility] today because it needs to be mailed out. She states it was the Tramadol ER 100

## 2015-07-15 ENCOUNTER — Encounter: Payer: Self-pay | Admitting: Internal Medicine

## 2015-07-16 ENCOUNTER — Other Ambulatory Visit: Payer: Self-pay | Admitting: *Deleted

## 2015-07-16 MED ORDER — TRAMADOL HCL (ER BIPHASIC) 100 MG PO CP24: 1.0000 | ORAL_CAPSULE | Freq: Two times a day (BID) | ORAL | Status: DC | PRN

## 2015-07-20 ENCOUNTER — Encounter: Payer: Self-pay | Admitting: Internal Medicine

## 2015-07-20 NOTE — Telephone Encounter (Signed)
PA initiated via [place] [phone] was DENIED.  Pt's plan only covers 1 tablet daily because it is extended release. The pharmacist suggested increasing dosage to the 200 mg capsule for once daily dosing as patient would need a minimum of 3 month trial and failure to appeal.  Spouse informed of same. Please advise

## 2015-07-21 NOTE — Telephone Encounter (Signed)
Per pharmacist, Extended Released Tablet is not scored and cannot be cut. Pt and spouse advised of same. Please advise, thanks

## 2015-07-21 NOTE — Telephone Encounter (Signed)
Dr Alain Marion, pt's insurance denied Quantity Exception for 100 mg ER Tramadol capsule sig: 1 cap twice daily, they will however cover 1 capsule or tablet once daily only.  Spouse is requesting to switch pt back to tablet and increase dosage to 200 mg so that she cut tablet in half and give him 1/2 tablet twice daily.  Since this is extended release I was not sure that this was possible.  Please advise

## 2015-07-23 MED ORDER — TRAMADOL HCL (ER BIPHASIC) 200 MG PO CP24: 1.0000 | ORAL_CAPSULE | Freq: Every day | ORAL | Status: DC

## 2015-07-23 NOTE — Telephone Encounter (Signed)
See documentation on 07/20/2015 Patient E-Mail

## 2015-07-23 NOTE — Telephone Encounter (Signed)
Spoke with pt's spouse and advised that Dr Alain Marion did write a new Rx for Tramadol ER 200 mg Capsule.  Spouse agreed that she will try pt on new dosage and if pain is not well controlled with once daily dosing she will follow up with office.   Rx faxed to [facility]

## 2015-08-02 ENCOUNTER — Encounter: Payer: Self-pay | Admitting: Internal Medicine

## 2015-08-02 DIAGNOSIS — R269 Unspecified abnormalities of gait and mobility: Secondary | ICD-10-CM | POA: Diagnosis not present

## 2015-08-02 DIAGNOSIS — R252 Cramp and spasm: Secondary | ICD-10-CM | POA: Diagnosis not present

## 2015-08-02 DIAGNOSIS — Z885 Allergy status to narcotic agent status: Secondary | ICD-10-CM | POA: Diagnosis not present

## 2015-08-02 DIAGNOSIS — G35 Multiple sclerosis: Secondary | ICD-10-CM | POA: Diagnosis not present

## 2015-08-02 DIAGNOSIS — Z888 Allergy status to other drugs, medicaments and biological substances status: Secondary | ICD-10-CM | POA: Diagnosis not present

## 2015-08-02 DIAGNOSIS — G8929 Other chronic pain: Secondary | ICD-10-CM | POA: Diagnosis not present

## 2015-08-02 DIAGNOSIS — Z79899 Other long term (current) drug therapy: Secondary | ICD-10-CM | POA: Diagnosis not present

## 2015-08-03 ENCOUNTER — Telehealth: Payer: Self-pay

## 2015-08-03 ENCOUNTER — Other Ambulatory Visit: Payer: Self-pay | Admitting: Internal Medicine

## 2015-08-03 MED ORDER — TRAMADOL HCL (ER BIPHASIC) 200 MG PO CP24: 1.0000 | ORAL_CAPSULE | Freq: Every day | ORAL | Status: DC

## 2015-08-03 NOTE — Telephone Encounter (Signed)
FYI: Wife dropped off some paperwork for this patient. IT was hep panel results.

## 2015-08-05 DIAGNOSIS — G35 Multiple sclerosis: Secondary | ICD-10-CM | POA: Diagnosis not present

## 2015-08-05 DIAGNOSIS — R110 Nausea: Secondary | ICD-10-CM | POA: Diagnosis not present

## 2015-08-05 DIAGNOSIS — R51 Headache: Secondary | ICD-10-CM | POA: Diagnosis not present

## 2015-08-19 DIAGNOSIS — G35 Multiple sclerosis: Secondary | ICD-10-CM | POA: Diagnosis not present

## 2015-08-19 DIAGNOSIS — Z79899 Other long term (current) drug therapy: Secondary | ICD-10-CM | POA: Diagnosis not present

## 2015-08-27 DIAGNOSIS — G35 Multiple sclerosis: Secondary | ICD-10-CM | POA: Diagnosis not present

## 2015-08-27 DIAGNOSIS — M21372 Foot drop, left foot: Secondary | ICD-10-CM | POA: Diagnosis not present

## 2015-09-02 ENCOUNTER — Other Ambulatory Visit: Payer: Self-pay | Admitting: Internal Medicine

## 2015-09-27 ENCOUNTER — Ambulatory Visit (INDEPENDENT_AMBULATORY_CARE_PROVIDER_SITE_OTHER): Payer: Medicare Other | Admitting: Internal Medicine

## 2015-09-27 ENCOUNTER — Encounter: Payer: Self-pay | Admitting: Internal Medicine

## 2015-09-27 DIAGNOSIS — K219 Gastro-esophageal reflux disease without esophagitis: Secondary | ICD-10-CM | POA: Diagnosis not present

## 2015-09-27 DIAGNOSIS — R269 Unspecified abnormalities of gait and mobility: Secondary | ICD-10-CM

## 2015-09-27 DIAGNOSIS — R51 Headache: Secondary | ICD-10-CM | POA: Diagnosis not present

## 2015-09-27 DIAGNOSIS — G35 Multiple sclerosis: Secondary | ICD-10-CM | POA: Diagnosis not present

## 2015-09-27 DIAGNOSIS — G8929 Other chronic pain: Secondary | ICD-10-CM

## 2015-09-27 DIAGNOSIS — M5440 Lumbago with sciatica, unspecified side: Secondary | ICD-10-CM

## 2015-09-27 DIAGNOSIS — R519 Headache, unspecified: Secondary | ICD-10-CM

## 2015-09-27 NOTE — Progress Notes (Signed)
Subjective:  Patient ID: [name], male    DOB: 10-08-1955  Age: 60 y.o. MRN: [id number]  CC: No chief complaint on file.   HPI [name] presents for MS, neuropathy, LBP, Vit D def. No HAs lately  Outpatient Medications Prior to Visit  Medication Sig Dispense Refill  . amitriptyline (ELAVIL) 25 MG tablet Take 3 tablets (75 mg total) by mouth at bedtime. 270 tablet 3  . baclofen (LIORESAL) 20 MG tablet TAKE 1 TABLET FOUR TIMES A DAY AS NEEDED 360 tablet 1  . cetirizine (ZYRTEC) 10 MG tablet Take 10 mg by mouth daily.      . clotrimazole-betamethasone (LOTRISONE) cream Apply topically 2 (two) times daily. 45 g 3  . [employer] VITAMIN D3 1000 UNITS capsule TAKE ONE CAPSULE BY MOUTH TWICE DAILY 200 capsule 2  . dalfampridine (AMPYRA) 10 MG TB12 Take 1 tablet (10 mg total) by mouth 2 (two) times daily. 180 tablet 3  . diclofenac sodium (VOLTAREN) 1 % GEL Apply 1 application topically 4 (four) times daily. 300 g 3  . fluticasone (FLONASE) 50 MCG/ACT nasal spray 2 sprays by Nasal route daily.      [name] gabapentin (NEURONTIN) 100 MG capsule TAKE 2 CAPSULES THREE TIMES A DAY 540 capsule 2  . meloxicam (MOBIC) 7.5 MG tablet TAKE 1 TABLET TWICE A DAY 180 tablet 1  . Multiple Vitamin (MULTIVITAMIN) capsule Take 1 capsule by mouth daily.      . pantoprazole (PROTONIX) 40 MG tablet TAKE 1 TABLET DAILY 90 tablet 2  . Silodosin (RAPAFLO) 8 MG CAPS Take by mouth.      . tamsulosin (FLOMAX) 0.4 MG CAPS capsule Take 0.4 mg by mouth as needed.    [name] tiZANidine (ZANAFLEX) 4 MG tablet Take 1 tablet (4 mg total) by mouth every 6 (six) hours as needed for muscle spasms. 120 tablet 3  . TraMADol HCl 200 MG CP24 Take 1 capsule by mouth daily. 90 capsule 2   No facility-administered medications prior to visit.     ROS Review of Systems  Constitutional: Negative for appetite change, fatigue and unexpected weight change.  HENT: Negative for congestion, nosebleeds, sneezing, sore throat and trouble swallowing.    Eyes: Negative for itching and visual disturbance.  Respiratory: Negative for cough.   Cardiovascular: Negative for chest pain and palpitations.  Gastrointestinal: Negative for abdominal distention, blood in stool, diarrhea and nausea.  Genitourinary: Negative for frequency and hematuria.  Musculoskeletal: Positive for arthralgias, back pain, gait problem, myalgias and neck stiffness. Negative for joint swelling and neck pain.  Skin: Negative for rash.  Neurological: Negative for dizziness, tremors, speech difficulty and weakness.  Psychiatric/Behavioral: Negative for agitation, dysphoric mood and sleep disturbance. The patient is not nervous/anxious.     Objective:  BP 120/80   Pulse 78   Wt 163 lb (73.9 kg)   SpO2 97%   BMI 25.53 kg/m   BP Readings from Last 3 Encounters:  09/27/15 120/80  03/29/15 120/88  09/25/14 116/80    Wt Readings from Last 3 Encounters:  09/27/15 163 lb (73.9 kg)  03/29/15 160 lb (72.6 kg)  09/25/14 156 lb (70.8 kg)    Physical Exam  Constitutional: He is oriented to person, place, and time. He appears well-developed. No distress.  NAD  HENT:  Mouth/Throat: Oropharynx is clear and moist.  Eyes: Conjunctivae are normal. Pupils are equal, round, and reactive to light.  Neck: Normal range of motion. No JVD present. No thyromegaly present.  Cardiovascular: Normal  rate, regular rhythm, normal heart sounds and intact distal pulses.  Exam reveals no gallop and no friction rub.   No murmur heard. Pulmonary/Chest: Effort normal and breath sounds normal. No respiratory distress. He has no wheezes. He has no rales. He exhibits no tenderness.  Abdominal: Soft. Bowel sounds are normal. He exhibits no distension and no mass. There is no tenderness. There is no rebound and no guarding.  Musculoskeletal: Normal range of motion. He exhibits tenderness. He exhibits no edema.  Lymphadenopathy:    He has no cervical adenopathy.  Neurological: He is alert and  oriented to person, place, and time. He has normal reflexes. No cranial nerve deficit. He exhibits normal muscle tone. He displays a negative Romberg sign. Coordination abnormal. Gait normal.  Skin: Skin is warm and dry. No rash noted.  Psychiatric: He has a normal mood and affect. His behavior is normal. Judgment and thought content normal.  cane Ataxic  Lab Results  Component Value Date   WBC 7.1 03/26/2015   HGB 14.7 03/26/2015   HCT 43.7 03/26/2015   PLT 193.0 03/26/2015   GLUCOSE 100 (H) 03/26/2015   CHOL 177 03/26/2015   TRIG 158.0 (H) 03/26/2015   HDL 46.00 03/26/2015   LDLDIRECT 118.4 06/09/2009   LDLCALC 99 03/26/2015   ALT 13 03/26/2015   AST 11 03/26/2015   NA 141 03/26/2015   K 4.5 03/26/2015   CL 102 03/26/2015   CREATININE 0.86 03/26/2015   BUN 11 03/26/2015   CO2 31 03/26/2015   TSH 2.35 03/26/2015   PSA 1.50 03/26/2015   INR 1.0 08/24/2008    Mr Brain W [id number] Contrast  Result Date: 12/01/2011 *RADIOLOGY REPORT* Clinical Data: Multiple sclerosis. MRI HEAD WITHOUT AND WITH CONTRAST Technique:  Multiplanar, multiecho pulse sequences of the brain and surrounding structures were obtained according to standard protocol without and with intravenous contrast Contrast: 51mL MULTIHANCE GADOBENATE DIMEGLUMINE 529 MG/ML IV SOLN Comparison: 07/04/2007. Findings: Scattered white matter type changes appear relatively similar to the prior exam consistent with the patient's history of multiple sclerosis without area of enhancement or restricted motion to suggest active demyelination.  No new lesion noted. No acute infarct. No intracranial hemorrhage. No hydrocephalus. Ectatic intracranial arterial structures.  This ectasia probably causes bulge like appearance of the cavernous segment of the left internal carotid artery which is without change.  Limited for evaluating for aneurysm without MR angiogram. No intracranial mass. IMPRESSION:  Scattered white matter type changes appear  relatively similar to the prior exam consistent with the patient's history of multiple sclerosis without area of enhancement or restricted motion to suggest active demyelination.  No new lesion noted. Prominent ectasia of intracranial vasculature as noted above. Original Report Authenticated By: Doug Sou, M.D.   Mr Cervical Spine W Wo Contrast  Result Date: 12/01/2011 *RADIOLOGY REPORT* Clinical Data:  Multiple sclerosis.  Progressive disease. MRI CERVICAL AND THORACIC SPINE WITHOUT AND WITH CONTRAST Technique:  Multiplanar and multiecho pulse sequences of the cervical spine, to include the craniocervical junction and cervicothoracic junction, and the thoracic spine, were obtained without and with intravenous contrast. Contrast: 6mL MULTIHANCE GADOBENATE DIMEGLUMINE 529 MG/ML IV SOLN Comparison:  Lumbar spine MRI 10/07/2010. MRI CERVICAL SPINE Findings:  Cervical spinal alignment anatomic.  There is no significant cervical stenosis.  Cervical cord demonstrates areas of high signal compatible with prior demyelinating disease, cervical cord demonstrates areas of demyelinating disease with high T2 and inversion recovery signal but no enhancing lesions.  This is compatible with prior  episodes of demyelinating disease in this patient with history of multiple sclerosis.  Flow voids are present in both vertebral arteries.  The paraspinal soft tissues are within normal limits.  The marrow signal is normal.  There is no cord swelling or edema to suggest active demyelination.  No focal atrophy of the cord. There is shallow disc bulge at C5-C6 without significant stenosis. The other levels appear within normal limits. IMPRESSION: 1.  No evidence of active demyelinating lesions. 2.  Multiple areas of high signal in the cervical cord compatible with gliosis from prior demyelinating lesions, compatible from multiple sclerosis. 3.  Minimal degenerative disease without stenosis. MRI THORACIC SPINE Findings: Thoracic  spinal alignment is anatomic.  Marrow signal is within normal limits.  Vertebral body height is preserved. Inversion recovery images demonstrate heterogeneous cord signal with.  Increased T2 signal compatible with prior demyelinating disease.  There is no enhancement or inflammation of the cord.  No cord swelling to suggest acute exacerbation of multiple sclerosis. These areas are present dorsal to T5-T6 and T9.  Mild thoracic spine degenerative disease is present. T1-T2:  Negative. T2-T3:  Negative. T3-T4:  Small right paracentral disc protrusion is present.  This indents the ventral aspect of the thoracic cord. T4-T5:  Negative. T5-T6:  Tiny left paracentral disc protrusion just contacting the ventral aspect of the thoracic cord.  Mild flattening. T6-T7:  Negative. T7-T8:  Small central disc protrusion indenting the ventral thoracic cord. T8-T9:  Small central disc protrusion flattening the ventral thoracic cord. T9-T10:  Negative. T10-T11:  Negative. T11-T12:  Mild facet arthrosis.  No stenosis. T12-L1:  Negative. IMPRESSION: 1.  No evidence of active demyelinating lesions.  Scattered areas of high T2 signal are compatible with gliosis in the areas of prior demyelinating disease associated with multiple sclerosis. 2.  Mild thoracic spondylosis described above. Original Report Authenticated By: Dereck Ligas, M.D.   Mr Thoracic Spine W Wo Contrast  Result Date: 12/01/2011 *RADIOLOGY REPORT* Clinical Data:  Multiple sclerosis.  Progressive disease. MRI CERVICAL AND THORACIC SPINE WITHOUT AND WITH CONTRAST Technique:  Multiplanar and multiecho pulse sequences of the cervical spine, to include the craniocervical junction and cervicothoracic junction, and the thoracic spine, were obtained without and with intravenous contrast. Contrast: 86mL MULTIHANCE GADOBENATE DIMEGLUMINE 529 MG/ML IV SOLN Comparison:  Lumbar spine MRI 10/07/2010. MRI CERVICAL SPINE Findings:  Cervical spinal alignment anatomic.  There is no  significant cervical stenosis.  Cervical cord demonstrates areas of high signal compatible with prior demyelinating disease, cervical cord demonstrates areas of demyelinating disease with high T2 and inversion recovery signal but no enhancing lesions.  This is compatible with prior episodes of demyelinating disease in this patient with history of multiple sclerosis.  Flow voids are present in both vertebral arteries.  The paraspinal soft tissues are within normal limits.  The marrow signal is normal.  There is no cord swelling or edema to suggest active demyelination.  No focal atrophy of the cord. There is shallow disc bulge at C5-C6 without significant stenosis. The other levels appear within normal limits. IMPRESSION: 1.  No evidence of active demyelinating lesions. 2.  Multiple areas of high signal in the cervical cord compatible with gliosis from prior demyelinating lesions, compatible from multiple sclerosis. 3.  Minimal degenerative disease without stenosis. MRI THORACIC SPINE Findings: Thoracic spinal alignment is anatomic.  Marrow signal is within normal limits.  Vertebral body height is preserved. Inversion recovery images demonstrate heterogeneous cord signal with.  Increased T2 signal compatible with prior demyelinating  disease.  There is no enhancement or inflammation of the cord.  No cord swelling to suggest acute exacerbation of multiple sclerosis. These areas are present dorsal to T5-T6 and T9.  Mild thoracic spine degenerative disease is present. T1-T2:  Negative. T2-T3:  Negative. T3-T4:  Small right paracentral disc protrusion is present.  This indents the ventral aspect of the thoracic cord. T4-T5:  Negative. T5-T6:  Tiny left paracentral disc protrusion just contacting the ventral aspect of the thoracic cord.  Mild flattening. T6-T7:  Negative. T7-T8:  Small central disc protrusion indenting the ventral thoracic cord. T8-T9:  Small central disc protrusion flattening the ventral thoracic cord.  T9-T10:  Negative. T10-T11:  Negative. T11-T12:  Mild facet arthrosis.  No stenosis. T12-L1:  Negative. IMPRESSION: 1.  No evidence of active demyelinating lesions.  Scattered areas of high T2 signal are compatible with gliosis in the areas of prior demyelinating disease associated with multiple sclerosis. 2.  Mild thoracic spondylosis described above. Original Report Authenticated By: Dereck Ligas, M.D.    Assessment & Plan:   There are no diagnoses linked to this encounter. I am having Mr. Percell Miller maintain his fluticasone, multivitamin, silodosin, cetirizine, diclofenac sodium, CVS VITAMIN D3, amitriptyline, dalfampridine, tamsulosin, tiZANidine, baclofen, meloxicam, clotrimazole-betamethasone, TraMADol HCl, pantoprazole, and gabapentin.  No orders of the defined types were placed in this encounter.    Follow-up: No Follow-up on file.  Walker Kehr, MD

## 2015-09-27 NOTE — Assessment & Plan Note (Signed)
Better  

## 2015-09-27 NOTE — Assessment & Plan Note (Signed)
Cane

## 2015-09-27 NOTE — Assessment & Plan Note (Signed)
On Ampira 2016 Ocrevus IV 2017 Tramadol prn Potential benefits of a long term Tramadol/opioids use as well as potential risks (i.e. addiction risk, apnea etc) and complications (i.e. Somnolence, constipation and others) were explained to the patient and were aknowledged. On Baclofen

## 2015-09-27 NOTE — Assessment & Plan Note (Signed)
On Tramadol Potential benefits of a long term Tramadol/opioids use as well as potential risks (i.e. addiction risk, apnea etc) and complications (i.e. Somnolence, constipation and others) were explained to the patient and were aknowledged. On Baclofen

## 2015-09-27 NOTE — Progress Notes (Signed)
Pre visit review using our clinic review tool, if applicable. No additional management support is needed unless otherwise documented below in the visit note. 

## 2015-09-27 NOTE — Assessment & Plan Note (Signed)
On Protonix 

## 2015-11-02 DIAGNOSIS — G35 Multiple sclerosis: Secondary | ICD-10-CM | POA: Diagnosis not present

## 2015-11-02 DIAGNOSIS — G8929 Other chronic pain: Secondary | ICD-10-CM | POA: Diagnosis not present

## 2015-11-02 DIAGNOSIS — Z885 Allergy status to narcotic agent status: Secondary | ICD-10-CM | POA: Diagnosis not present

## 2015-11-02 DIAGNOSIS — Z79899 Other long term (current) drug therapy: Secondary | ICD-10-CM | POA: Diagnosis not present

## 2015-11-02 DIAGNOSIS — Z888 Allergy status to other drugs, medicaments and biological substances status: Secondary | ICD-10-CM | POA: Diagnosis not present

## 2015-12-20 ENCOUNTER — Other Ambulatory Visit: Payer: Self-pay | Admitting: Internal Medicine

## 2016-01-27 DIAGNOSIS — Z79899 Other long term (current) drug therapy: Secondary | ICD-10-CM | POA: Diagnosis not present

## 2016-01-27 DIAGNOSIS — G35 Multiple sclerosis: Secondary | ICD-10-CM | POA: Diagnosis not present

## 2016-02-05 ENCOUNTER — Encounter: Payer: Self-pay | Admitting: Internal Medicine

## 2016-02-11 ENCOUNTER — Telehealth: Payer: Self-pay | Admitting: *Deleted

## 2016-02-11 NOTE — Telephone Encounter (Signed)
Wife left msg on triage requesting refill on husband Tramadol [facility] day supply [facility].Marland KitchenJohny Chess

## 2016-02-12 NOTE — Telephone Encounter (Signed)
pls ref 12 mo Thx

## 2016-02-14 MED ORDER — TRAMADOL HCL (ER BIPHASIC) 200 MG PO CP24: 1.0000 | ORAL_CAPSULE | Freq: Every day | ORAL | 1 refills | Status: DC

## 2016-02-14 NOTE — Telephone Encounter (Signed)
Can only do a 6 month supply [facility] due to med being a control. Printed [facility]...[name]

## 2016-02-15 ENCOUNTER — Other Ambulatory Visit: Payer: Self-pay | Admitting: Internal Medicine

## 2016-02-15 MED ORDER — TRAMADOL HCL (ER BIPHASIC) 200 MG PO CP24: 1.0000 | ORAL_CAPSULE | Freq: Every day | ORAL | 1 refills | Status: DC

## 2016-02-16 NOTE — Telephone Encounter (Signed)
[facility].../lmb 

## 2016-02-28 ENCOUNTER — Telehealth: Payer: Self-pay | Admitting: *Deleted

## 2016-02-28 NOTE — Telephone Encounter (Signed)
Started PA on cover-my-meds Your PA has been faxed to the plan as a paper copy can take up to 5 business day for approval.Waiting on approval response...[name]

## 2016-02-28 NOTE — Telephone Encounter (Signed)
Pt left msg on triage stating MD [facility], and they are needing PA. They have sent PA just waiting to here back from office...[name]

## 2016-02-29 NOTE — Telephone Encounter (Signed)
Contact plan to follow up on [id number]. Still waiting for approval.../lmb

## 2016-03-01 NOTE — Telephone Encounter (Signed)
Spoke w/pharmacist Danae Chen May gave verbal to fill from 02/15/16. Called pt/wife no answer LMOM w/status on med...[name]

## 2016-03-01 NOTE — Telephone Encounter (Signed)
Rec'd PA back stating medication doesn't need prior authorization. Case ID [id number]. [facility] to get some clarification. Per rep Margarita Grizzle not sure why rx that was faxed on 1/9, was voided. Trying to see if pharmacist will take verbal since can't call control substance into them.

## 2016-03-08 DIAGNOSIS — R531 Weakness: Secondary | ICD-10-CM | POA: Diagnosis not present

## 2016-03-08 DIAGNOSIS — R2689 Other abnormalities of gait and mobility: Secondary | ICD-10-CM | POA: Diagnosis not present

## 2016-03-08 DIAGNOSIS — D8989 Other specified disorders involving the immune mechanism, not elsewhere classified: Secondary | ICD-10-CM | POA: Diagnosis not present

## 2016-03-08 DIAGNOSIS — M21372 Foot drop, left foot: Secondary | ICD-10-CM | POA: Diagnosis not present

## 2016-03-08 DIAGNOSIS — Z79899 Other long term (current) drug therapy: Secondary | ICD-10-CM | POA: Diagnosis not present

## 2016-03-08 DIAGNOSIS — Z885 Allergy status to narcotic agent status: Secondary | ICD-10-CM | POA: Diagnosis not present

## 2016-03-08 DIAGNOSIS — G8929 Other chronic pain: Secondary | ICD-10-CM | POA: Diagnosis not present

## 2016-03-08 DIAGNOSIS — Z888 Allergy status to other drugs, medicaments and biological substances status: Secondary | ICD-10-CM | POA: Diagnosis not present

## 2016-03-08 DIAGNOSIS — G35 Multiple sclerosis: Secondary | ICD-10-CM | POA: Diagnosis not present

## 2016-03-20 ENCOUNTER — Encounter (HOSPITAL_COMMUNITY): Payer: Self-pay | Admitting: Emergency Medicine

## 2016-03-20 ENCOUNTER — Observation Stay (HOSPITAL_COMMUNITY)
Admission: EM | Admit: 2016-03-20 | Discharge: 2016-03-21 | Disposition: A | Discharge status: Home / Self Care | Payer: Medicare Other | Attending: Family Medicine | Admitting: Family Medicine

## 2016-03-20 DIAGNOSIS — J101 Influenza due to other identified influenza virus with other respiratory manifestations: Secondary | ICD-10-CM

## 2016-03-20 DIAGNOSIS — G8929 Other chronic pain: Secondary | ICD-10-CM | POA: Insufficient documentation

## 2016-03-20 DIAGNOSIS — R112 Nausea with vomiting, unspecified: Secondary | ICD-10-CM | POA: Diagnosis not present

## 2016-03-20 DIAGNOSIS — R531 Weakness: Secondary | ICD-10-CM | POA: Diagnosis not present

## 2016-03-20 DIAGNOSIS — J45909 Unspecified asthma, uncomplicated: Secondary | ICD-10-CM | POA: Diagnosis present

## 2016-03-20 DIAGNOSIS — R197 Diarrhea, unspecified: Secondary | ICD-10-CM | POA: Diagnosis present

## 2016-03-20 DIAGNOSIS — G35 Multiple sclerosis: Secondary | ICD-10-CM | POA: Diagnosis present

## 2016-03-20 DIAGNOSIS — Z79899 Other long term (current) drug therapy: Secondary | ICD-10-CM | POA: Insufficient documentation

## 2016-03-20 DIAGNOSIS — R109 Unspecified abdominal pain: Secondary | ICD-10-CM | POA: Diagnosis not present

## 2016-03-20 DIAGNOSIS — D72829 Elevated white blood cell count, unspecified: Secondary | ICD-10-CM | POA: Diagnosis present

## 2016-03-20 DIAGNOSIS — A084 Viral intestinal infection, unspecified: Secondary | ICD-10-CM | POA: Diagnosis not present

## 2016-03-20 DIAGNOSIS — K219 Gastro-esophageal reflux disease without esophagitis: Secondary | ICD-10-CM | POA: Diagnosis present

## 2016-03-20 DIAGNOSIS — M545 Low back pain: Secondary | ICD-10-CM | POA: Diagnosis not present

## 2016-03-20 DIAGNOSIS — J4520 Mild intermittent asthma, uncomplicated: Secondary | ICD-10-CM | POA: Diagnosis not present

## 2016-03-20 DIAGNOSIS — G4700 Insomnia, unspecified: Secondary | ICD-10-CM | POA: Diagnosis present

## 2016-03-20 LAB — CBC
HCT: 45 % (ref 39.0–52.0)
HEMATOCRIT: 39.5 % (ref 39.0–52.0)
HEMOGLOBIN: 13.5 g/dL (ref 13.0–17.0)
Hemoglobin: 15.7 g/dL (ref 13.0–17.0)
MCH: 30.5 pg (ref 26.0–34.0)
MCH: 30.5 pg (ref 26.0–34.0)
MCHC: 34.9 g/dL (ref 30.0–36.0)
MCHC: 34.2 g/dL (ref 30.0–36.0)
MCV: 87.4 fL (ref 78.0–100.0)
MCV: 89.4 fL (ref 78.0–100.0)
PLATELETS: 201 10*3/uL (ref 150–400)
Platelets: 181 10*3/uL (ref 150–400)
RBC: 5.15 MIL/uL (ref 4.22–5.81)
RBC: 4.42 MIL/uL (ref 4.22–5.81)
RDW: 12.3 % (ref 11.5–15.5)
RDW: 12.7 % (ref 11.5–15.5)
WBC: 16.6 10*3/uL — ABNORMAL HIGH (ref 4.0–10.5)
WBC: 9.9 10*3/uL (ref 4.0–10.5)

## 2016-03-20 LAB — RESPIRATORY PANEL BY PCR
Adenovirus: NOT DETECTED
BORDETELLA PERTUSSIS-RVPCR: NOT DETECTED
Chlamydophila pneumoniae: NOT DETECTED
Coronavirus 229E: NOT DETECTED
Coronavirus HKU1: NOT DETECTED
Coronavirus NL63: NOT DETECTED
Coronavirus OC43: NOT DETECTED
INFLUENZA A H3-RVPPCR: DETECTED — AB
Influenza B: NOT DETECTED
Metapneumovirus: NOT DETECTED
Mycoplasma pneumoniae: NOT DETECTED
PARAINFLUENZA VIRUS 3-RVPPCR: NOT DETECTED
PARAINFLUENZA VIRUS 4-RVPPCR: NOT DETECTED
Parainfluenza Virus 1: NOT DETECTED
Parainfluenza Virus 2: NOT DETECTED
RHINOVIRUS / ENTEROVIRUS - RVPPCR: NOT DETECTED
Respiratory Syncytial Virus: NOT DETECTED

## 2016-03-20 LAB — CREATININE, SERUM: Creatinine, Ser: 0.76 mg/dL (ref 0.61–1.24)

## 2016-03-20 LAB — COMPREHENSIVE METABOLIC PANEL
ALBUMIN: 4.3 g/dL (ref 3.5–5.0)
ALK PHOS: 64 U/L (ref 38–126)
ALT: 26 U/L (ref 17–63)
AST: 19 U/L (ref 15–41)
Anion gap: 10 (ref 5–15)
BUN: 18 mg/dL (ref 6–20)
CHLORIDE: 103 mmol/L (ref 101–111)
CO2: 24 mmol/L (ref 22–32)
CREATININE: 0.66 mg/dL (ref 0.61–1.24)
Calcium: 8.7 mg/dL — ABNORMAL LOW (ref 8.9–10.3)
GFR calc non Af Amer: >60 mL/min (ref >60)
GLUCOSE: 132 mg/dL — AB (ref 65–99)
Potassium: 3.6 mmol/L (ref 3.5–5.1)
SODIUM: 137 mmol/L (ref 135–145)
Total Bilirubin: 0.7 mg/dL (ref 0.3–1.2)
Total Protein: 7.5 g/dL (ref 6.5–8.1)

## 2016-03-20 LAB — URINALYSIS, ROUTINE W REFLEX MICROSCOPIC
BILIRUBIN URINE: NEGATIVE
GLUCOSE, UA: NEGATIVE mg/dL
HGB URINE DIPSTICK: NEGATIVE
Ketones, ur: 80 mg/dL — AB
Leukocytes, UA: NEGATIVE
Nitrite: NEGATIVE
PROTEIN: NEGATIVE mg/dL
Specific Gravity, Urine: 1.026 (ref 1.005–1.030)
pH: 5 (ref 5.0–8.0)

## 2016-03-20 LAB — MAGNESIUM: Magnesium: 1.7 mg/dL (ref 1.7–2.4)

## 2016-03-20 LAB — PHOSPHORUS: PHOSPHORUS: 2.5 mg/dL (ref 2.5–4.6)

## 2016-03-20 LAB — LIPASE, BLOOD: LIPASE: 15 U/L (ref 11–51)

## 2016-03-20 LAB — TSH: TSH: 0.851 u[IU]/mL (ref 0.350–4.500)

## 2016-03-20 MED ORDER — TIZANIDINE HCL 4 MG PO TABS: 4.0000 mg | ORAL_TABLET | Freq: Four times a day (QID) | ORAL | Status: DC | PRN

## 2016-03-20 MED ORDER — PROMETHAZINE HCL 25 MG/ML IJ SOLN
12.5000 mg | Freq: Once | INTRAMUSCULAR | Status: AC
  Administered 2016-03-20: 12.5 mg via INTRAVENOUS
  Filled 2016-03-20: qty 1

## 2016-03-20 MED ORDER — SUCRALFATE 1 GM/10ML PO SUSP
1.0000 g | Freq: Two times a day (BID) | ORAL | Status: DC
  Administered 2016-03-20 – 2016-03-21 (×2): 1 g via ORAL
  Filled 2016-03-20 (×2): qty 10

## 2016-03-20 MED ORDER — DALFAMPRIDINE ER 10 MG PO TB12
10.0000 mg | ORAL_TABLET | Freq: Two times a day (BID) | ORAL | Status: DC
Start: 2016-03-20 — End: 2016-03-21

## 2016-03-20 MED ORDER — ACETAMINOPHEN 325 MG PO TABS: 650.0000 mg | ORAL_TABLET | Freq: Four times a day (QID) | ORAL | Status: DC | PRN

## 2016-03-20 MED ORDER — IBUPROFEN 800 MG PO TABS
800.0000 mg | ORAL_TABLET | Freq: Once | ORAL | Status: AC
  Administered 2016-03-20: 800 mg via ORAL
  Filled 2016-03-20: qty 1

## 2016-03-20 MED ORDER — PANTOPRAZOLE SODIUM 40 MG PO TBEC
40.0000 mg | DELAYED_RELEASE_TABLET | Freq: Every day | ORAL | Status: DC
  Administered 2016-03-20 – 2016-03-21 (×2): 40 mg via ORAL
  Filled 2016-03-20 (×2): qty 1

## 2016-03-20 MED ORDER — SODIUM CHLORIDE 0.9 % IV SOLN
INTRAVENOUS | Status: DC
  Administered 2016-03-20 – 2016-03-21 (×2): via INTRAVENOUS

## 2016-03-20 MED ORDER — TRAMADOL HCL (ER BIPHASIC) 200 MG PO CP24: 1.0000 | ORAL_CAPSULE | Freq: Two times a day (BID) | ORAL | Status: DC | PRN

## 2016-03-20 MED ORDER — PROCHLORPERAZINE EDISYLATE 5 MG/ML IJ SOLN: 10.0000 mg | Freq: Four times a day (QID) | INTRAMUSCULAR | Status: DC | PRN

## 2016-03-20 MED ORDER — HEPARIN SODIUM (PORCINE) 5000 UNIT/ML IJ SOLN
5000.0000 [IU] | Freq: Three times a day (TID) | INTRAMUSCULAR | Status: DC
  Administered 2016-03-20 – 2016-03-21 (×2): 5000 [IU] via SUBCUTANEOUS
  Filled 2016-03-20 (×3): qty 1

## 2016-03-20 MED ORDER — KETOROLAC TROMETHAMINE 30 MG/ML IJ SOLN
30.0000 mg | Freq: Once | INTRAMUSCULAR | Status: AC
  Administered 2016-03-20: 30 mg via INTRAVENOUS
  Filled 2016-03-20: qty 1

## 2016-03-20 MED ORDER — ONDANSETRON HCL 4 MG PO TABS: 4.0000 mg | ORAL_TABLET | Freq: Four times a day (QID) | ORAL | Status: DC | PRN

## 2016-03-20 MED ORDER — LORATADINE 10 MG PO TABS
10.0000 mg | ORAL_TABLET | Freq: Every day | ORAL | Status: DC
  Administered 2016-03-21: 10 mg via ORAL
  Filled 2016-03-20: qty 1

## 2016-03-20 MED ORDER — FLUTICASONE PROPIONATE 50 MCG/ACT NA SUSP
2.0000 | Freq: Every day | NASAL | Status: DC | PRN
  Filled 2016-03-20: qty 16

## 2016-03-20 MED ORDER — TRAMADOL HCL 50 MG PO TABS
50.0000 mg | ORAL_TABLET | Freq: Four times a day (QID) | ORAL | Status: DC | PRN
Start: 2016-03-20 — End: 2016-03-21

## 2016-03-20 MED ORDER — GABAPENTIN 100 MG PO CAPS
200.0000 mg | ORAL_CAPSULE | Freq: Three times a day (TID) | ORAL | Status: DC | PRN
  Administered 2016-03-20 – 2016-03-21 (×2): 200 mg via ORAL
  Filled 2016-03-20 (×2): qty 2

## 2016-03-20 MED ORDER — ONDANSETRON 4 MG PO TBDP
4.0000 mg | ORAL_TABLET | Freq: Once | ORAL | Status: AC | PRN
  Administered 2016-03-20: 4 mg via ORAL
  Filled 2016-03-20: qty 1

## 2016-03-20 MED ORDER — ONDANSETRON HCL 4 MG/2ML IJ SOLN
4.0000 mg | Freq: Once | INTRAMUSCULAR | Status: AC
  Administered 2016-03-20: 4 mg via INTRAVENOUS
  Filled 2016-03-20: qty 2

## 2016-03-20 MED ORDER — AMITRIPTYLINE HCL 25 MG PO TABS
50.0000 mg | ORAL_TABLET | Freq: Every day | ORAL | Status: DC
  Administered 2016-03-20: 50 mg via ORAL
  Filled 2016-03-20: qty 2

## 2016-03-20 MED ORDER — ACETAMINOPHEN 650 MG RE SUPP: 650.0000 mg | Freq: Four times a day (QID) | RECTAL | Status: DC | PRN

## 2016-03-20 MED ORDER — ONDANSETRON HCL 4 MG/2ML IJ SOLN: 4.0000 mg | Freq: Four times a day (QID) | INTRAMUSCULAR | Status: DC | PRN

## 2016-03-20 MED ORDER — IPRATROPIUM-ALBUTEROL 0.5-2.5 (3) MG/3ML IN SOLN: 3.0000 mL | Freq: Four times a day (QID) | RESPIRATORY_TRACT | Status: DC | PRN

## 2016-03-20 MED ORDER — SODIUM CHLORIDE 0.9 % IV BOLUS (SEPSIS)
1000.0000 mL | Freq: Once | INTRAVENOUS | Status: AC
  Administered 2016-03-20: 1000 mL via INTRAVENOUS

## 2016-03-20 MED ORDER — DICYCLOMINE HCL 10 MG/ML IM SOLN
20.0000 mg | Freq: Three times a day (TID) | INTRAMUSCULAR | Status: DC | PRN
  Filled 2016-03-20: qty 2

## 2016-03-20 NOTE — ED Notes (Signed)
Patient adds that he has also having a MS flare up this morning with muscles in left contracting and not wanting to work.

## 2016-03-20 NOTE — ED Notes (Signed)
Patient is aware we need urine. States "it'll be awhile."

## 2016-03-20 NOTE — ED Triage Notes (Signed)
Patient started feeling bad and vomiting last night.  Patient's wife was just discharged from hospital yesterday for Norovirus. patient has MS.

## 2016-03-20 NOTE — H&P (Signed)
History and Physical    [name] [id number] DOB: [birth year] DOA: 03/20/2016  Referring Provider: Dr. Kathrynn Humble PCP: Walker Kehr, MD   Patient coming from: home  Chief Complaint: nausea, vomiting, abd discomfort  HPI: [name] is a 61 y.o. male with PMH significant for MS, chronic low back pain, GERD and insomnia; who presented to ED with complaints of nausea/vomiting and abd pain. Patient reports symptoms started overnight. Pain in his abd is crampy and in epigastric region, no radiation; associated with nausea, bloating sensation and vomiting; no aggravating factors and relieved with zofran given in ED. Patient denies CP, fever, chills, dysuria, cough, HA's, hematemesis, coffee ground emesis, melena or any other complaints.  Of note; patient wife was just discharge from the hospital after almost a week of hospitalization due to severe gastroenteritis from norovirus infection.  ED Course: patient received 1L of IVF's and some antiemetics. Despite treatment he continue feeling bad and with ongoing nausea. Right before my exam expressed some small vomiting. Patient blood work essentially normal except for mild WBC's elevation. Will place in observation for symptomatic management and IVF's resuscitation.  Review of Systems:  All other systems reviewed and apart from HPI, are negative.  Past Medical History:  Diagnosis Date  . Arthritis   . Gallstone pancreatitis   . Hernia   . History of inguinal hernia repair   . Low back pain   . MS (multiple sclerosis) ([place])   . Pancreatitis hosp[italized with gallstone pancreatitis 3/12[  . Vitamin D deficiency     Past Surgical History:  Procedure Laterality Date  . CHOLECYSTECTOMY  04/2010  . CYSTECTOMY     on back and R hand  . HERNIA REPAIR    . INGUINAL HERNIA REPAIR  03/17/2011   Procedure: HERNIA REPAIR INGUINAL ADULT;  Surgeon: Adin Hector, MD;  Location: [facility];  Service: General;  Laterality:  Left;  Open left inguinal hernia repair with mesh  . KNEE ARTHROSCOPY     right  . NOSE SURGERY    . TONSILLECTOMY    . WISDOM TOOTH EXTRACTION       reports that he has never smoked. He has never used smokeless tobacco. He reports that he does not drink alcohol or use drugs.  Allergies  Allergen Reactions  . Meperidine Other (See Comments)    Reaction:  Unknown   . Prednisone Palpitations    Family History  Problem Relation Age of Onset  . Cancer Father     ?  . Diabetes Mother     Prior to Admission medications   Medication Sig Start Date End Date Taking? Authorizing Provider  amitriptyline (ELAVIL) 25 MG tablet Take 25-75 mg by mouth at bedtime.   Yes Historical Provider, MD  baclofen (LIORESAL) 20 MG tablet Take 20 mg by mouth 4 (four) times daily as needed for muscle spasms.   Yes Historical Provider, MD  cetirizine (ZYRTEC) 10 MG tablet Take 10 mg by mouth daily as needed for allergies.    Yes Historical Provider, MD  cholecalciferol (VITAMIN D) 1000 units tablet Take 2,000 Units by mouth 2 (two) times daily.   Yes Historical Provider, MD  clotrimazole-betamethasone (LOTRISONE) cream Apply 1 application topically 2 (two) times daily as needed (for irritation).   Yes Historical Provider, MD  dalfampridine (AMPYRA) 10 MG TB12 Take 1 tablet (10 mg total) by mouth 2 (two) times daily. 09/04/12  Yes Aleksei Plotnikov V, MD  fluticasone (FLONASE) 50 MCG/ACT nasal spray Place 2  sprays into both nostrils daily as needed for rhinitis.    Yes Historical Provider, MD  gabapentin (NEURONTIN) 100 MG capsule Take 200 mg by mouth 3 (three) times daily as needed (for pain).   Yes Historical Provider, MD  Multiple Vitamin (MULTIVITAMIN WITH MINERALS) TABS tablet Take 1 tablet by mouth daily.   Yes Historical Provider, MD  ocrelizumab 600 mg in sodium chloride 0.9 % 500 mL Inject 600 mg into the vein every 6 (six) months.   Yes Historical Provider, MD  pantoprazole (PROTONIX) 40 MG tablet Take  40 mg by mouth daily.   Yes Historical Provider, MD  tiZANidine (ZANAFLEX) 4 MG tablet Take 4 mg by mouth every 6 (six) hours as needed for muscle spasms.   Yes Historical Provider, MD  TraMADol HCl 200 MG CP24 Take 1 capsule by mouth daily. 02/15/16  Yes Cassandria Anger, MD    Physical Exam: Vitals:   03/20/16 0907 03/20/16 1157 03/20/16 1432 03/20/16 1657  BP: 130/96 145/86 114/72 123/76  Pulse: 111 103 103 98  Resp: 18 18 18 20   Temp: 98.4 F (36.9 C)   98.2 F (36.8 C)  TempSrc: Oral   Oral  SpO2: 96% 94% 94% 94%  Weight: 73.9 kg (163 lb)     Height: 5\' 7"  (1.702 m)       Constitutional: mild distress due to nausea feeling and abd discomfort; no fever. Eyes: PERTLA, lids and conjunctivae normal ENMT: Mucous membranes are dry, lips are cracked. Posterior pharynx clear of any exudate or lesions. Normal dentition.  Neck: normal, supple, no masses, no thyromegaly, no JVD Respiratory: clear to auscultation bilaterally, no wheezing, no crackles. Normal respiratory effort. No accessory muscle use.  Cardiovascular: S1 & S2 heard, regular rate and rhythm, no murmurs / rubs / gallops. No extremity edema. 2+ pedal pulses. No carotid bruits.  Abdomen: No distension, mild epigastric tenderness, no masses palpated. No hepatosplenomegaly. Bowel sounds normal.  Musculoskeletal: no clubbing / cyanosis. No joint deformity upper and lower extremities. Good ROM, no contractures. Normal muscle tone.  Neurologic: CN 2-12 grossly intact. Sensation intact, DTR normal. Strength 5/5 in all 4 limbs.  Psychiatric: Normal judgment and insight. Alert and oriented x 3. Normal mood.    Labs on Admission: I have personally reviewed following labs and imaging studies  CBC:  Recent Labs Lab 03/20/16 0953  WBC 16.6*  HGB 15.7  HCT 45.0  MCV 87.4  PLT 123456   Basic Metabolic Panel:  Recent Labs Lab 03/20/16 0953  NA 137  K 3.6  CL 103  CO2 24  GLUCOSE 132*  BUN 18  CREATININE 0.66  CALCIUM  8.7*   GFR: Estimated Creatinine Clearance: 90.7 mL/min (by C-G formula based on SCr of 0.66 mg/dL).   Liver Function Tests:  Recent Labs Lab 03/20/16 0953  AST 19  ALT 26  ALKPHOS 64  BILITOT 0.7  PROT 7.5  ALBUMIN 4.3    Recent Labs Lab 03/20/16 0953  LIPASE 15   Urine analysis:    Component Value Date/Time   COLORURINE YELLOW 03/26/2015 0845   APPEARANCEUR CLEAR 03/26/2015 0845   LABSPEC <=1.005 (A) 03/26/2015 0845   PHURINE 7.0 03/26/2015 0845   GLUCOSEU NEGATIVE 03/26/2015 0845   HGBUR NEGATIVE 03/26/2015 0845   BILIRUBINUR NEGATIVE 03/26/2015 0845   KETONESUR NEGATIVE 03/26/2015 0845   PROTEINUR NEGATIVE 04/07/2010 0825   UROBILINOGEN 0.2 03/26/2015 0845   NITRITE NEGATIVE 03/26/2015 0845   LEUKOCYTESUR NEGATIVE 03/26/2015 0845   EKG:  None   Assessment/Plan 1-nausea/vomiting and abd discomfort: most likely due to Viral gastroenteritis -will place in observation  -IVF's resuscitation -PRN antiemetics and PPI's -will slowly advance diet as tolerated  -follow clinical response   2-INSOMNIA, CHRONIC -will continue elavil  3-Multiple sclerosis ([facility]) -mainly affecting left side  -will continue home medication regimen   4-GERD (gastroesophageal reflux disease) -will continue PPI and will use PRN Bentyl  5-Asthma -no wheezing -will PRN duoneb  6-Leukocytosis -most likely from stress demargination  -will check UA -but patient afebrile and without complaints    Time: 55 minutes   DVT prophylaxis: heparin   Code Status: Full Family Communication: wife at bedside  Disposition Plan: anticipate discharge back home when able to tolerate PO's properly and with better control on GI upset sx's. (less than 2 midnights most likely) Consults called: none   Admission status: observation, med-surg, LOS < 2 midnight     Barton Dubois MD [facility] Pager [phone]  If 7PM-7AM, please contact night-coverage [email or web address] Password  [facility]  03/20/2016, 5:09 PM

## 2016-03-20 NOTE — ED Notes (Signed)
Pt reminded of need for urine sample.  

## 2016-03-20 NOTE — ED Notes (Signed)
Will give bedside report.  

## 2016-03-20 NOTE — ED Provider Notes (Signed)
[facility] DEPT Provider Note   CSN: [id number] Arrival date & time: 03/20/16  0848     History   Chief Complaint Chief Complaint  Patient presents with  . Emesis    HPI [name] is a 61 y.o. male.  HPI   61 year old male with history of multiple sclerosis, gallstone pancreatitis and history of inguinal hernia repair presenting complaining of vomiting. Patient states his wife was recently diagnosed with Norovirus in which she was hospitalized for nearly a week and was discharged yesterday. Patient has been doing fine up until last night when he developed acute onset of projectile vomiting as well as feeling weak and fatigued. States he has been vomiting copious amount of food content and now with yellow emesis. After receiving Zofran while waiting symptom seems to be improving. Patient mentioned he has history of MS usually affecting his left side of body and since last night he is having muscle stiffness and muscle contraction throughout the entire left side. He has been compliant with his medication. He has had a bowel movement with some mild loose stool. He hasn't passed flatus today but states he doesn't have the urge to. Patient denies having fever, severe abdominal pain, dysuria, or rash.  Past Medical History:  Diagnosis Date  . Arthritis   . Gallstone pancreatitis   . Hernia   . History of inguinal hernia repair   . Low back pain   . MS (multiple sclerosis) ([place])   . Pancreatitis hosp[italized with gallstone pancreatitis 3/12[  . Vitamin D deficiency     Patient Active Problem List   Diagnosis Date Noted  . GERD (gastroesophageal reflux disease) 09/25/2014  . Wrist pain, right 09/11/2011  . Left inguinal hernia 02/16/2011  . Paresthesia 01/24/2011  . Well adult exam 07/20/2010  . GALLSTONE PANCREATITIS 04/15/2010  . FREQUENCY, URINARY 04/15/2010  . Abnormality of gait 04/05/2010  . Headache 04/05/2010  . INSOMNIA, CHRONIC 10/15/2009  . Vitamin D  deficiency 06/14/2009  . SEBORRHEIC DERMATITIS 06/14/2009  . LOW BACK PAIN 06/14/2009  . ASTHMA NOS W/ACUTE EXACERBATION 04/19/2009  . UPPER RESPIRATORY INFECTION, ACUTE 04/13/2009  . Multiple sclerosis ([place]) 05/14/2008  . COUGH 05/14/2008    Past Surgical History:  Procedure Laterality Date  . CHOLECYSTECTOMY  04/2010  . CYSTECTOMY     on back and R hand  . HERNIA REPAIR    . INGUINAL HERNIA REPAIR  03/17/2011   Procedure: HERNIA REPAIR INGUINAL ADULT;  Surgeon: Adin Hector, MD;  Location: [facility];  Service: General;  Laterality: Left;  Open left inguinal hernia repair with mesh  . KNEE ARTHROSCOPY     right  . NOSE SURGERY    . TONSILLECTOMY    . WISDOM TOOTH EXTRACTION         Home Medications    Prior to Admission medications   Medication Sig Start Date End Date Taking? Authorizing Provider  amitriptyline (ELAVIL) 25 MG tablet Take 3 tablets (75 mg total) by mouth at bedtime. 09/04/12   Aleksei Plotnikov V, MD  baclofen (LIORESAL) 20 MG tablet TAKE 1 TABLET FOUR TIMES A DAY AS NEEDED 12/21/15   Aleksei Plotnikov V, MD  cetirizine (ZYRTEC) 10 MG tablet Take 10 mg by mouth daily.      Historical Provider, MD  clotrimazole-betamethasone (LOTRISONE) cream Apply topically 2 (two) times daily. 05/24/15 05/23/16  Aleksei Plotnikov V, MD  [employer] VITAMIN D3 1000 UNITS capsule TAKE ONE CAPSULE BY MOUTH TWICE DAILY 03/05/12   Aleksei Plotnikov  V, MD  dalfampridine (AMPYRA) 10 MG TB12 Take 1 tablet (10 mg total) by mouth 2 (two) times daily. 09/04/12   Aleksei Plotnikov V, MD  diclofenac sodium (VOLTAREN) 1 % GEL Apply 1 application topically 4 (four) times daily. 09/11/11   Aleksei Plotnikov V, MD  fluticasone (FLONASE) 50 MCG/ACT nasal spray 2 sprays by Nasal route daily.      Historical Provider, MD  gabapentin (NEURONTIN) 100 MG capsule TAKE 2 CAPSULES THREE TIMES A DAY 09/02/15   Aleksei Plotnikov V, MD  meloxicam (MOBIC) 7.5 MG tablet TAKE 1 TABLET TWICE A DAY 03/29/15    Aleksei Plotnikov V, MD  Multiple Vitamin (MULTIVITAMIN) capsule Take 1 capsule by mouth daily.      Historical Provider, MD  pantoprazole (PROTONIX) 40 MG tablet TAKE 1 TABLET DAILY 09/02/15   Lew Dawes V, MD  Silodosin (RAPAFLO) 8 MG CAPS Take by mouth.      Historical Provider, MD  tamsulosin (FLOMAX) 0.4 MG CAPS capsule Take 0.4 mg by mouth as needed. 06/26/14   Historical Provider, MD  tiZANidine (ZANAFLEX) 4 MG tablet TAKE 1 TABLET EVERY 6 HOURS AS NEEDED FOR MUSCLE SPASMS 12/21/15   Aleksei Plotnikov V, MD  TraMADol HCl 200 MG CP24 Take 1 capsule by mouth daily. 02/15/16   Cassandria Anger, MD    Family History Family History  Problem Relation Age of Onset  . Cancer Father     ?  . Diabetes Mother     Social History Social History  Substance Use Topics  . Smoking status: Never Smoker  . Smokeless tobacco: Never Used  . Alcohol use No     Allergies   Meperidine and Prednisone   Review of Systems Review of Systems  All other systems reviewed and are negative.    Physical Exam Updated Vital Signs BP 130/96 (BP Location: Left Arm)   Pulse 111   Temp 98.4 F (36.9 C) (Oral)   Resp 18   Ht 5\' 7"  (1.702 m)   Wt 73.9 kg   SpO2 96%   BMI 25.53 kg/m   Physical Exam  Constitutional: He appears well-developed and well-nourished. No distress.  HENT:  Head: Atraumatic.  Mouth/Throat: Oropharynx is clear and moist.  Eyes: Conjunctivae are normal.  Neck: Neck supple.  Cardiovascular:  Tachycardia without murmurs rubs or gallops  Pulmonary/Chest: Effort normal and breath sounds normal.  Abdominal: Soft. Bowel sounds are normal. He exhibits no distension. There is tenderness (Mild epigastric tenderness without guarding or rebound tenderness.).  Neurological: He is alert.  Weakness and muscle stiffness to the left  Side of body including L arm and L leg.  Difficulty with movement or sitting up.   Skin: No rash noted.  Psychiatric: He has a normal mood and  affect.  Nursing note and vitals reviewed.    ED Treatments / Results  Labs (all labs ordered are listed, but only abnormal results are displayed) Labs Reviewed  COMPREHENSIVE METABOLIC PANEL - Abnormal; Notable for the following:       Result Value   Glucose, Bld 132 (*)    Calcium 8.7 (*)    All other components within normal limits  CBC - Abnormal; Notable for the following:    WBC 16.6 (*)    All other components within normal limits  RESPIRATORY PANEL BY PCR  LIPASE, BLOOD  URINALYSIS, ROUTINE W REFLEX MICROSCOPIC    EKG  EKG Interpretation None       Radiology No results found.  Procedures Procedures (including critical care time)  Medications Ordered in ED Medications  ketorolac (TORADOL) 30 MG/ML injection 30 mg (not administered)  ondansetron (ZOFRAN-ODT) disintegrating tablet 4 mg (4 mg Oral Given 03/20/16 0912)  ondansetron (ZOFRAN) injection 4 mg (4 mg Intravenous Given 03/20/16 1313)  ibuprofen (ADVIL,MOTRIN) tablet 800 mg (800 mg Oral Given 03/20/16 1323)     Initial Impression / Assessment and Plan / ED Course  I have reviewed the triage vital signs and the nursing notes.  Pertinent labs & imaging results that were available during my care of the patient were reviewed by me and considered in my medical decision making (see chart for details).     BP 114/72 (BP Location: Right Arm)   Pulse 103   Temp 98.4 F (36.9 C) (Oral)   Resp 18   Ht 5\' 7"  (1.702 m)   Wt 73.9 kg   SpO2 94%   BMI 25.53 kg/m    Final Clinical Impressions(s) / ED Diagnoses   Final diagnoses:  Nausea vomiting and diarrhea  Multiple sclerosis exacerbation (HCC)    New Prescriptions New Prescriptions   No medications on file   12:01 PM Patient with history of MS here with nausea and vomiting after exposure to wife who was recently discharge from hospital after 1 week of norovirus.  He has a benign abdominal exam.  3:07 PM After receiving IV fluid and antinausea  medication, patient's felt better. He has not vomited. He does endorse headache and requesting medication for that. Does not have any nuchal rigidity or fever to suggest meningitis. Does have history of MS and now complaining of worsening weakness to the left side likely due to an MS exacerbation.  3:48 PM Patient no longer vomiting. He is able to stand with assistance.   4:10 PM Patient now complaining of nausea and requests to be admitted for further medical management.  He report trouble urinating and attributed to his MS flare. However, pt also denies having the urge to urinate at this time.  His wife does not feel comfortable caring for him at home, therefore I will consult for admission.    4:27 PM Appreciate consultation from [facility] Dr. Dyann Kief who agrees to see pt in the ER and will determine disposition for further management of his MS flare and viral GI sxs.     Domenic Moras, PA-C 03/20/16 [place], MD 03/20/16 [phone]

## 2016-03-20 NOTE — ED Notes (Addendum)
Patient is unable to ambulate, due to MS.

## 2016-03-20 NOTE — ED Notes (Signed)
hospitalist at bedside, will attempt bladder scan after he leaves

## 2016-03-21 DIAGNOSIS — R112 Nausea with vomiting, unspecified: Secondary | ICD-10-CM

## 2016-03-21 DIAGNOSIS — J101 Influenza due to other identified influenza virus with other respiratory manifestations: Secondary | ICD-10-CM

## 2016-03-21 DIAGNOSIS — K219 Gastro-esophageal reflux disease without esophagitis: Secondary | ICD-10-CM

## 2016-03-21 DIAGNOSIS — G35 Multiple sclerosis: Secondary | ICD-10-CM | POA: Diagnosis not present

## 2016-03-21 DIAGNOSIS — A084 Viral intestinal infection, unspecified: Secondary | ICD-10-CM | POA: Diagnosis not present

## 2016-03-21 LAB — BASIC METABOLIC PANEL
Anion gap: 5 (ref 5–15)
BUN: 16 mg/dL (ref 6–20)
CALCIUM: 8 mg/dL — AB (ref 8.9–10.3)
CO2: 27 mmol/L (ref 22–32)
CREATININE: 0.75 mg/dL (ref 0.61–1.24)
Chloride: 108 mmol/L (ref 101–111)
GFR calc Af Amer: >60 mL/min (ref >60)
GFR calc non Af Amer: >60 mL/min (ref >60)
GLUCOSE: 98 mg/dL (ref 65–99)
Potassium: 3.6 mmol/L (ref 3.5–5.1)
Sodium: 140 mmol/L (ref 135–145)

## 2016-03-21 LAB — CBC
HCT: 40.1 % (ref 39.0–52.0)
Hemoglobin: 13.8 g/dL (ref 13.0–17.0)
MCH: 30.8 pg (ref 26.0–34.0)
MCHC: 34.4 g/dL (ref 30.0–36.0)
MCV: 89.5 fL (ref 78.0–100.0)
Platelets: 167 10*3/uL (ref 150–400)
RBC: 4.48 MIL/uL (ref 4.22–5.81)
RDW: 12.9 % (ref 11.5–15.5)
WBC: 7 10*3/uL (ref 4.0–10.5)

## 2016-03-21 NOTE — Discharge Summary (Signed)
Physician Discharge Summary  Naun Commerford [id number] DOB: 10-17-1955 DOA: 03/20/2016  PCP: Walker Kehr, MD  Admit date: 03/20/2016 Discharge date: 03/21/2016  Admitted From: Home Disposition: Home   Recommendations for Outpatient Follow-up:  1. Follow up with PCP in 1-2 weeks  Home Health: None Equipment/Devices: None Discharge Condition: Stable CODE STATUS: Full Diet recommendation: Regular as tolerated  Brief/Interim Summary: [name] is a 61 y.o. male with PMH significant for MS, chronic low back pain, GERD and insomnia who presented to ED with complaints of nausea/vomiting and abd pain. WBC 16.6, CMP and UA unremarkable. IV fluids were given and the patient was brought in for observation. Respiratory virus panel was checked, found to be positive for influenza. Overnight symptoms improved dramatically with IV fluids. Because symptoms had already resolved, after discussion with the patient regarding risks and benefits of tamiflu, we decided to forego treatment. He was evaluated by PT, who had no recommendations. He ate a full breakfast without recurrence of symptoms, and was discharged in good condition.   Discharge Diagnoses:  Principal Problem:   Viral gastroenteritis Active Problems:   INSOMNIA, CHRONIC   Multiple sclerosis (HCC)   GERD (gastroesophageal reflux disease)   Nausea & vomiting   Asthma   Leukocytosis   Intractable nausea and vomiting  Discharge Instructions Discharge Instructions    Discharge instructions    Complete by:  As directed    You were admitted with nausea and vomiting related to the flu. Because your symptoms have resolved, there is no indication for tamiflu to treat the flu, so you are stable for discharge with the following recommendations:  - Restart all your previous medications - Schedule follow up with your primary care doctor in the next 1 - 2 weeks - If  your symptoms return seek medical attention right away.     Allergies as  of 03/21/2016      Reactions   Meperidine Other (See Comments)   Reaction:  Unknown    Prednisone Palpitations      Medication List    TAKE these medications   amitriptyline 25 MG tablet Commonly known as:  ELAVIL Take 50 mg by mouth at bedtime as needed for sleep.   baclofen 20 MG tablet Commonly known as:  LIORESAL Take 20 mg by mouth 4 (four) times daily as needed for muscle spasms.   cetirizine 10 MG tablet Commonly known as:  ZYRTEC Take 10 mg by mouth daily as needed for allergies.   cholecalciferol 1000 units tablet Commonly known as:  VITAMIN D Take 2,000 Units by mouth 2 (two) times daily.   clotrimazole-betamethasone cream Commonly known as:  LOTRISONE Apply 1 application topically 2 (two) times daily as needed (for irritation).   dalfampridine 10 MG Tb12 Commonly known as:  AMPYRA Take 1 tablet (10 mg total) by mouth 2 (two) times daily.   fluticasone 50 MCG/ACT nasal spray Commonly known as:  FLONASE Place 2 sprays into both nostrils daily as needed for rhinitis.   gabapentin 100 MG capsule Commonly known as:  NEURONTIN Take 200 mg by mouth 3 (three) times daily as needed (for pain).   multivitamin with minerals Tabs tablet Take 1 tablet by mouth daily.   ocrelizumab 600 mg in sodium chloride 0.9 % 500 mL Inject 600 mg into the vein every 6 (six) months.   pantoprazole 40 MG tablet Commonly known as:  PROTONIX Take 40 mg by mouth daily.   tiZANidine 4 MG tablet Commonly known as:  ZANAFLEX Take 4  mg by mouth every 6 (six) hours as needed for muscle spasms.   TraMADol HCl 200 MG Cp24 Take 1 capsule by mouth daily.      Follow-up Information    Walker Kehr, MD. Call in 2 week(s).   Specialty:  Internal Medicine Contact information: [place] [address] [phone]          Allergies  Allergen Reactions  . Meperidine Other (See Comments)    Reaction:  Unknown   . Prednisone Palpitations    Consultations:  None  Procedures/Studies: None  Subjective: Pt reports symptoms have gradually improved over night. No further emesis or abdominal pain. Ate breakfast without issues and has been ambulating at his baseline.   Discharge Exam: Vitals:   03/20/16 2100 03/21/16 0439  BP: 121/64 115/64  Pulse: 82 74  Resp: 20 20  Temp:  98.4 F (36.9 C)   General: Pt is alert, awake, not in acute distress Cardiovascular: RRR, S1/S2 +, no rubs, no gallops Respiratory: CTA bilaterally, no wheezing, no rhonchi Abdominal: Soft, NT, ND, bowel sounds + Extremities: no edema, no cyanosis Neuro: Mild left hemiplegia at baseline per pt.  The results of significant diagnostics from this hospitalization (including imaging, microbiology, ancillary and laboratory) are listed below for reference.    Labs: Basic Metabolic Panel:  Recent Labs Lab 03/20/16 0953 03/20/16 2120 03/21/16 0533  NA 137  --  140  K 3.6  --  3.6  CL 103  --  108  CO2 24  --  27  GLUCOSE 132*  --  98  BUN 18  --  16  CREATININE 0.66 0.76 0.75  CALCIUM 8.7*  --  8.0*  MG  --  1.7  --   PHOS  --  2.5  --    Liver Function Tests:  Recent Labs Lab 03/20/16 0953  AST 19  ALT 26  ALKPHOS 64  BILITOT 0.7  PROT 7.5  ALBUMIN 4.3    Recent Labs Lab 03/20/16 0953  LIPASE 15   CBC:  Recent Labs Lab 03/20/16 0953 03/20/16 2120 03/21/16 0533  WBC 16.6* 9.9 7.0  HGB 15.7 13.5 13.8  HCT 45.0 39.5 40.1  MCV 87.4 89.4 89.5  PLT 201 181 167   Thyroid function studies  Recent Labs  03/20/16 2120  TSH 0.851   Urinalysis    Component Value Date/Time   COLORURINE YELLOW 03/20/2016 0910   APPEARANCEUR CLEAR 03/20/2016 0910   LABSPEC 1.026 03/20/2016 0910   PHURINE 5.0 03/20/2016 0910   GLUCOSEU NEGATIVE 03/20/2016 0910   GLUCOSEU NEGATIVE 03/26/2015 0845   HGBUR NEGATIVE 03/20/2016 0910   BILIRUBINUR NEGATIVE 03/20/2016 0910   KETONESUR 80 (A) 03/20/2016 0910   PROTEINUR NEGATIVE  03/20/2016 0910   UROBILINOGEN 0.2 03/26/2015 0845   NITRITE NEGATIVE 03/20/2016 0910   LEUKOCYTESUR NEGATIVE 03/20/2016 0910    Microbiology Recent Results (from the past 240 hour(s))  Respiratory Panel by PCR     Status: Abnormal   Collection Time: 03/20/16  4:10 PM  Result Value Ref Range Status   Adenovirus NOT DETECTED NOT DETECTED Final   Coronavirus 229E NOT DETECTED NOT DETECTED Final   Coronavirus HKU1 NOT DETECTED NOT DETECTED Final   Coronavirus NL63 NOT DETECTED NOT DETECTED Final   Coronavirus OC43 NOT DETECTED NOT DETECTED Final   Metapneumovirus NOT DETECTED NOT DETECTED Final   Rhinovirus / Enterovirus NOT DETECTED NOT DETECTED Final   Influenza A H3 DETECTED (A) NOT DETECTED Final  Influenza B NOT DETECTED NOT DETECTED Final   Parainfluenza Virus 1 NOT DETECTED NOT DETECTED Final   Parainfluenza Virus 2 NOT DETECTED NOT DETECTED Final   Parainfluenza Virus 3 NOT DETECTED NOT DETECTED Final   Parainfluenza Virus 4 NOT DETECTED NOT DETECTED Final   Respiratory Syncytial Virus NOT DETECTED NOT DETECTED Final   Bordetella pertussis NOT DETECTED NOT DETECTED Final   Chlamydophila pneumoniae NOT DETECTED NOT DETECTED Final   Mycoplasma pneumoniae NOT DETECTED NOT DETECTED Final    Comment: Performed at [facility], [place] [address], [place], [place] [address]    Time coordinating discharge: Approximately 40 minutes  Vance Gather, MD  [facility] 03/21/2016, 5:39 PM Pager [phone]  If 7PM-7AM, please contact night-coverage [email or web address] Password [email or web address]

## 2016-03-21 NOTE — Care Management Note (Signed)
Case Management Note  Patient Details  Name: [name] MRN: [id number] Date of Birth: 10/25/1955  Subjective/Objective:  61 y/o m admitted w/viral gastroenteritis. From home.                  Action/Plan:d/c home.   Expected Discharge Date:  03/21/16               Expected Discharge Plan:  Home/Self Care  In-House Referral:     Discharge planning Services  CM Consult  Post Acute Care Choice:    Choice offered to:     DME Arranged:    DME Agency:     HH Arranged:    HH Agency:     Status of Service:  Completed, signed off  If discussed at [employer] of Stay Meetings, dates discussed:    Additional Comments:  Dessa Phi, RN 03/21/2016, 10:28 AM

## 2016-03-21 NOTE — Progress Notes (Signed)
Pt refused physical therapy consult.  Pt states he does PT at home and has all needed equipment.  Went over all discharge information with patient and family.  All questions answered.  VSS. Pt wheeled out by RN.

## 2016-03-21 NOTE — Care Management Obs Status (Signed)
[facility] NOTIFICATION   Patient Details  Name: [name] MRN: [id number] Date of Birth: 1955-09-28   Medicare Observation Status Notification Given:  Yes    MahabirJuliann Pulse, RN 03/21/2016, 10:27 AM

## 2016-03-29 ENCOUNTER — Telehealth: Payer: Self-pay

## 2016-03-29 ENCOUNTER — Ambulatory Visit (INDEPENDENT_AMBULATORY_CARE_PROVIDER_SITE_OTHER): Payer: Medicare Other | Admitting: Internal Medicine

## 2016-03-29 ENCOUNTER — Encounter: Payer: Self-pay | Admitting: Internal Medicine

## 2016-03-29 VITALS — BP 120/84 | HR 77 | Temp 98.4°F | Resp 16 | Ht 67.0 in | Wt 154.5 lb

## 2016-03-29 DIAGNOSIS — E559 Vitamin D deficiency, unspecified: Secondary | ICD-10-CM

## 2016-03-29 DIAGNOSIS — R269 Unspecified abnormalities of gait and mobility: Secondary | ICD-10-CM

## 2016-03-29 DIAGNOSIS — M5440 Lumbago with sciatica, unspecified side: Secondary | ICD-10-CM | POA: Diagnosis not present

## 2016-03-29 DIAGNOSIS — R112 Nausea with vomiting, unspecified: Secondary | ICD-10-CM

## 2016-03-29 DIAGNOSIS — Z0000 Encounter for general adult medical examination without abnormal findings: Secondary | ICD-10-CM | POA: Diagnosis not present

## 2016-03-29 DIAGNOSIS — G8929 Other chronic pain: Secondary | ICD-10-CM

## 2016-03-29 DIAGNOSIS — J101 Influenza due to other identified influenza virus with other respiratory manifestations: Secondary | ICD-10-CM

## 2016-03-29 DIAGNOSIS — R519 Headache, unspecified: Secondary | ICD-10-CM

## 2016-03-29 DIAGNOSIS — R51 Headache: Secondary | ICD-10-CM | POA: Diagnosis not present

## 2016-03-29 DIAGNOSIS — G35 Multiple sclerosis: Secondary | ICD-10-CM | POA: Diagnosis not present

## 2016-03-29 MED ORDER — KETOROLAC TROMETHAMINE 10 MG PO TABS: 10.0000 mg | ORAL_TABLET | Freq: Every day | ORAL | 2 refills | Status: DC | PRN

## 2016-03-29 MED ORDER — KETOROLAC TROMETHAMINE 10 MG PO TABS
10.0000 mg | ORAL_TABLET | Freq: Every day | ORAL | 2 refills | Status: DC | PRN
Start: 2016-03-29 — End: 2017-09-15

## 2016-03-29 NOTE — Assessment & Plan Note (Signed)
On Orevus, Baclofen

## 2016-03-29 NOTE — Progress Notes (Signed)
Pre-visit discussion using our clinic review tool. No additional management support is needed unless otherwise documented below in the visit note.  

## 2016-03-29 NOTE — Assessment & Plan Note (Signed)
Toradol helped a lot - use rarely NSAIDs risks discussed

## 2016-03-29 NOTE — Patient Instructions (Signed)
Continue to eat heart healthy diet (full of fruits, vegetables, whole grains, lean protein, water--limit salt, fat, and sugar intake) and increase physical activity as tolerated.   [name] , Thank you for taking time to come for your Medicare Wellness Visit. I appreciate your ongoing commitment to your health goals. Please review the following plan we discussed and let me know if I can assist you in the future.   These are the goals we discussed: Goals    . maintain current health status       This is a list of the screening recommended for you and due dates:  Health Maintenance  Topic Date Due  .  Hepatitis C: One time screening is recommended by Center for Disease Control  (CDC) for  adults born from [birth year] through 1966.   09/08/1955  . HIV Screening  01/30/1970  . Colon Cancer Screening  01/30/2005  . Flu Shot  09/26/2016*  . Tetanus Vaccine  09/05/2022  *Topic was postponed. The date shown is not the original due date.

## 2016-03-29 NOTE — Progress Notes (Addendum)
Subjective:   [name] is a 61 y.o. male who presents for an Initial Medicare Annual Wellness Visit.  Review of Systems  No ROS.  Medicare Wellness Visit.    Sleep patterns: no sleep issues, feels rested on waking, gets up 1 times nightly to void and sleeps 8 hours nightly.   Home Safety/Smoke Alarms:  Feels safe in home. Smoke alarms in place.  Living environment; residence and Firearm Safety: 1-story house/ trailer, firearms stored safely.Lives with wife Seat Belt Safety/Bike Helmet: Wears seat belt.   Counseling:   Eye Exam- goes yearly Dental- goes yearly   Male:   [facility]-  Last, over 10  Years, Cologuard referral started per [facility] PSA-  Lab Results  Component Value Date   PSA 1.50 03/26/2015   PSA 2.09 03/27/2014   PSA 1.90 09/02/2012       Objective:    Today's Vitals   03/29/16 1346  BP: 120/84  Pulse: 77  Resp: 16  Temp: 98.4 F (36.9 C)  TempSrc: Oral  SpO2: 98%  Weight: 154 lb 8 oz (70.1 kg)  Height: 5\' 7"  (1.702 m)   Body mass index is 24.2 kg/m.  Current Medications (verified) Outpatient Encounter Prescriptions as of 03/29/2016  Medication Sig  . amitriptyline (ELAVIL) 25 MG tablet Take 50 mg by mouth at bedtime as needed for sleep.   . baclofen (LIORESAL) 20 MG tablet Take 20 mg by mouth 4 (four) times daily as needed for muscle spasms.  . cetirizine (ZYRTEC) 10 MG tablet Take 10 mg by mouth daily as needed for allergies.   . cholecalciferol (VITAMIN D) 1000 units tablet Take 2,000 Units by mouth 2 (two) times daily.  . clotrimazole-betamethasone (LOTRISONE) cream Apply 1 application topically 2 (two) times daily as needed (for irritation).  [name] dalfampridine (AMPYRA) 10 MG TB12 Take 1 tablet (10 mg total) by mouth 2 (two) times daily.  . fluticasone (FLONASE) 50 MCG/ACT nasal spray Place 2 sprays into both nostrils daily as needed for rhinitis.   [name] gabapentin (NEURONTIN) 100 MG capsule Take 200 mg by mouth 3 (three) times daily as needed (for  pain).  [name] ketorolac (TORADOL) 10 MG tablet Take 1 tablet (10 mg total) by mouth daily as needed for severe pain.  . Multiple Vitamin (MULTIVITAMIN WITH MINERALS) TABS tablet Take 1 tablet by mouth daily.  [name] ocrelizumab 600 mg in sodium chloride 0.9 % 500 mL Inject 600 mg into the vein every 6 (six) months.  . pantoprazole (PROTONIX) 40 MG tablet Take 40 mg by mouth daily.  [name] tiZANidine (ZANAFLEX) 4 MG tablet Take 4 mg by mouth every 6 (six) hours as needed for muscle spasms.  . TraMADol HCl 200 MG CP24 Take 1 capsule by mouth daily.  . [DISCONTINUED] ketorolac (TORADOL) 10 MG tablet Take 1 tablet (10 mg total) by mouth daily as needed.  . [DISCONTINUED] ketorolac (TORADOL) 10 MG tablet Take 1 tablet (10 mg total) by mouth daily as needed.   No facility-administered encounter medications on file as of 03/29/2016.     Allergies (verified) Meperidine and Prednisone   History: Past Medical History:  Diagnosis Date  . Arthritis   . Gallstone pancreatitis   . Hernia   . History of inguinal hernia repair   . Low back pain   . MS (multiple sclerosis) ([place])   . Pancreatitis hosp[italized with gallstone pancreatitis 3/12[  . Vitamin D deficiency    Past Surgical History:  Procedure Laterality Date  . CHOLECYSTECTOMY  04/2010  . CYSTECTOMY     on back and R hand  . HERNIA REPAIR    . INGUINAL HERNIA REPAIR  03/17/2011   Procedure: HERNIA REPAIR INGUINAL ADULT;  Surgeon: Adin Hector, MD;  Location: [facility];  Service: General;  Laterality: Left;  Open left inguinal hernia repair with mesh  . KNEE ARTHROSCOPY     right  . NOSE SURGERY    . TONSILLECTOMY    . WISDOM TOOTH EXTRACTION     Family History  Problem Relation Age of Onset  . Cancer Father     ?  . Diabetes Mother    Social History   Occupational History  . aviation [employer]   Social History Main Topics  . Smoking status: Never Smoker  . Smokeless tobacco: Never Used  . Alcohol use No  . Drug  use: No  . Sexual activity: Yes   Tobacco Counseling Counseling given: Not Answered   Activities of Daily Living In your present state of health, do you have any difficulty performing the following activities: 03/29/2016 03/20/2016  Hearing? N N  Vision? N N  Difficulty concentrating or making decisions? N N  Walking or climbing stairs? N Y  Dressing or bathing? N Y  Doing errands, shopping? N Y  Conservation officer, nature and eating ? N -  Using the Toilet? N -  In the past six months, have you accidently leaked urine? N -  Do you have problems with loss of bowel control? N -  Managing your Medications? N -  Managing your Finances? N -  Housekeeping or managing your Housekeeping? N -  Some recent data might be hidden    Immunizations and Health Maintenance Immunization History  Administered Date(s) Administered  . Influenza Split 01/24/2011  . Influenza Whole 11/07/2011  . Pneumococcal Conjugate-13 03/27/2014  . Pneumococcal Polysaccharide-23 06/14/2009  . Tdap 09/04/2012  . Zoster 03/29/2015   Health Maintenance Due  Topic Date Due  . Hepatitis C Screening  11-04-1955  . HIV Screening  01/30/1970  . COLONOSCOPY  01/30/2005    Patient Care Team: Cassandria Anger, MD as PCP - General Ala Bent, MD as Referring Physician (Neurology)  Indicate any recent Medical Services you may have received from other than [facility] providers in the past year (date may be approximate).    Assessment:   This is a routine wellness examination for [place].Physical assessment deferred to PCP.   Hearing/Vision screen Hearing Screening Comments: Chronic ringing in bilateral ears, has seen audiology in the pass for this Vision Screening Comments: Reading glasses  Dietary issues and exercise activities discussed: Current Exercise Habits: Home exercise routine, Type of exercise: calisthenics, Time (Minutes): 40, Frequency (Times/Week): 5, Weekly Exercise (Minutes/Week): 200, Intensity:  Moderate  Diet (meal preparation, eat out, water intake, caffeinated beverages, dairy products, fruits and vegetables): in general, a "healthy" diet  , well balanced, diabetic, low fat/ cholesterol, low salt. Patient he very seldom drinks anything but water.    Goals    . maintain current health status          Continue to be as active as possible and eat a healthy diet.      Depression Screen PHQ 2/9 Scores 03/29/2016 03/29/2016 03/29/2015  PHQ - 2 Score 0 0 0    Fall Risk Fall Risk  03/29/2016 03/29/2016  Falls in the past year? Yes Yes  Number falls in past yr: 1 1  Injury with Fall? No No  Cognitive Function:       Ad8 score reviewed for issues:  Issues making decisions: no  Less interest in hobbies / activities: no  Repeats questions, stories (family complaining): no  Trouble using ordinary gadgets (microwave, computer, phone): no  Forgets the month or year: no  Mismanaging finances: no  Remembering appts: no  Daily problems with thinking and/or memory: no Ad8 score is= 0     Screening Tests Health Maintenance  Topic Date Due  . Hepatitis C Screening  06-12-1955  . HIV Screening  01/30/1970  . COLONOSCOPY  01/30/2005  . INFLUENZA VACCINE  09/26/2016 (Originally 09/07/2015)  . TETANUS/TDAP  09/05/2022        Plan:     Continue to eat heart healthy diet (full of fruits, vegetables, whole grains, lean protein, water--limit salt, fat, and sugar intake) and increase physical activity as tolerated.  During the course of the visit Jagur was educated and counseled about the following appropriate screening and preventive services:   Vaccines to include Pneumoccal, Influenza, Hepatitis B, Td, Zostavax, HCV  Colorectal cancer screening  Cardiovascular disease screening  Diabetes screening  Glaucoma screening  Nutrition counseling  Prostate cancer screening   Patient Instructions (the written plan) were given to the patient.   Michiel Cowboy,  RN   03/29/2016   Medical screening examination/treatment/procedure(s) were performed by non-physician practitioner and as supervising physician I was immediately available for consultation/collaboration. I agree with above. Walker Kehr, MD

## 2016-03-29 NOTE — Assessment & Plan Note (Signed)
Recovering  

## 2016-03-29 NOTE — Assessment & Plan Note (Signed)
Cont w/ Tramadol Potential benefits of a long term Tramadol/opioids use as well as potential risks (i.e. addiction risk, apnea etc) and complications (i.e. Somnolence, constipation and others) were explained to the patient and were aknowledged. On Baclofen

## 2016-03-29 NOTE — Assessment & Plan Note (Signed)
On Vit D 

## 2016-03-29 NOTE — Telephone Encounter (Signed)
Order [id number]

## 2016-03-29 NOTE — Assessment & Plan Note (Signed)
Sx's resolved °

## 2016-03-29 NOTE — Addendum Note (Signed)
Addended by: Emelia Loron A on: 03/29/2016 02:56 PM   Modules accepted: SmartSet

## 2016-03-29 NOTE — Assessment & Plan Note (Signed)
Cane

## 2016-03-29 NOTE — Progress Notes (Signed)
Subjective:  Patient ID: [name], male    DOB: January 22, 1956  Age: 61 y.o. MRN: [id number]  CC: Annual Exam   HPI [name] presents for a post-hosp stay (d/c on 03/21/16) for gastroenteritis - notes reviewed) Influenza A (+) F/u MS, chronic pain, imbalance. Toradol helped a lot w/a HA  Outpatient Medications Prior to Visit  Medication Sig Dispense Refill  . amitriptyline (ELAVIL) 25 MG tablet Take 50 mg by mouth at bedtime as needed for sleep.     . baclofen (LIORESAL) 20 MG tablet Take 20 mg by mouth 4 (four) times daily as needed for muscle spasms.    . cetirizine (ZYRTEC) 10 MG tablet Take 10 mg by mouth daily as needed for allergies.     . cholecalciferol (VITAMIN D) 1000 units tablet Take 2,000 Units by mouth 2 (two) times daily.    . clotrimazole-betamethasone (LOTRISONE) cream Apply 1 application topically 2 (two) times daily as needed (for irritation).    [name] dalfampridine (AMPYRA) 10 MG TB12 Take 1 tablet (10 mg total) by mouth 2 (two) times daily. 180 tablet 3  . fluticasone (FLONASE) 50 MCG/ACT nasal spray Place 2 sprays into both nostrils daily as needed for rhinitis.     [name] gabapentin (NEURONTIN) 100 MG capsule Take 200 mg by mouth 3 (three) times daily as needed (for pain).    . Multiple Vitamin (MULTIVITAMIN WITH MINERALS) TABS tablet Take 1 tablet by mouth daily.    [name] ocrelizumab 600 mg in sodium chloride 0.9 % 500 mL Inject 600 mg into the vein every 6 (six) months.    . pantoprazole (PROTONIX) 40 MG tablet Take 40 mg by mouth daily.    [name] tiZANidine (ZANAFLEX) 4 MG tablet Take 4 mg by mouth every 6 (six) hours as needed for muscle spasms.    . TraMADol HCl 200 MG CP24 Take 1 capsule by mouth daily. 90 capsule 1   No facility-administered medications prior to visit.     ROS Review of Systems  Constitutional: Positive for fatigue. Negative for appetite change and unexpected weight change.  HENT: Negative for congestion, nosebleeds, sneezing, sore throat and  trouble swallowing.   Eyes: Negative for itching and visual disturbance.  Respiratory: Negative for cough.   Cardiovascular: Negative for chest pain, palpitations and leg swelling.  Gastrointestinal: Negative for abdominal distention, blood in stool, diarrhea and nausea.  Genitourinary: Negative for frequency and hematuria.  Musculoskeletal: Positive for arthralgias, gait problem and myalgias. Negative for back pain, joint swelling and neck pain.  Skin: Negative for rash.  Neurological: Positive for weakness. Negative for dizziness, tremors and speech difficulty.  Psychiatric/Behavioral: Negative for agitation, dysphoric mood, sleep disturbance and suicidal ideas. The patient is not nervous/anxious.     Objective:  BP 120/84   Pulse 77   Temp 98.4 F (36.9 C) (Oral)   Resp 16   Ht 5\' 7"  (1.702 m)   Wt 154 lb 8 oz (70.1 kg)   SpO2 98%   BMI 24.20 kg/m   BP Readings from Last 3 Encounters:  03/29/16 120/84  03/21/16 115/64  09/27/15 120/80    Wt Readings from Last 3 Encounters:  03/29/16 154 lb 8 oz (70.1 kg)  03/20/16 152 lb 8.9 oz (69.2 kg)  09/27/15 163 lb (73.9 kg)    Physical Exam  Constitutional: He is oriented to person, place, and time. He appears well-developed. No distress.  NAD  HENT:  Mouth/Throat: Oropharynx is clear and moist.  Eyes: Conjunctivae are normal.  Pupils are equal, round, and reactive to light.  Neck: Normal range of motion. No JVD present. No thyromegaly present.  Cardiovascular: Normal rate, regular rhythm, normal heart sounds and intact distal pulses.  Exam reveals no gallop and no friction rub.   No murmur heard. Pulmonary/Chest: Effort normal and breath sounds normal. No respiratory distress. He has no wheezes. He has no rales. He exhibits no tenderness.  Abdominal: Soft. Bowel sounds are normal. He exhibits no distension and no mass. There is no tenderness. There is no rebound and no guarding.  Musculoskeletal: Normal range of motion. He  exhibits tenderness. He exhibits no edema.  Lymphadenopathy:    He has no cervical adenopathy.  Neurological: He is alert and oriented to person, place, and time. He has normal reflexes. No cranial nerve deficit. He exhibits normal muscle tone. He displays a negative Romberg sign. Coordination abnormal. Gait normal.  Skin: Skin is warm and dry. No rash noted.  Psychiatric: He has a normal mood and affect. His behavior is normal. Judgment and thought content normal.  Cane  Lab Results  Component Value Date   WBC 7.0 03/21/2016   HGB 13.8 03/21/2016   HCT 40.1 03/21/2016   PLT 167 03/21/2016   GLUCOSE 98 03/21/2016   CHOL 177 03/26/2015   TRIG 158.0 (H) 03/26/2015   HDL 46.00 03/26/2015   LDLDIRECT 118.4 06/09/2009   LDLCALC 99 03/26/2015   ALT 26 03/20/2016   AST 19 03/20/2016   NA 140 03/21/2016   K 3.6 03/21/2016   CL 108 03/21/2016   CREATININE 0.75 03/21/2016   BUN 16 03/21/2016   CO2 27 03/21/2016   TSH 0.851 03/20/2016   PSA 1.50 03/26/2015   INR 1.0 08/24/2008    No results found.  Assessment & Plan:   There are no diagnoses linked to this encounter. I am having Mr. [name] maintain his fluticasone, cetirizine, dalfampridine, TraMADol HCl, amitriptyline, baclofen, clotrimazole-betamethasone, cholecalciferol, gabapentin, pantoprazole, tiZANidine, multivitamin with minerals, and ocrelizumab 600 mg in sodium chloride 0.9 % 500 mL.  No orders of the defined types were placed in this encounter.    Follow-up: No Follow-up on file.  Walker Kehr, MD

## 2016-04-18 DIAGNOSIS — Z1211 Encounter for screening for malignant neoplasm of colon: Secondary | ICD-10-CM | POA: Diagnosis not present

## 2016-04-18 DIAGNOSIS — Z1212 Encounter for screening for malignant neoplasm of rectum: Secondary | ICD-10-CM | POA: Diagnosis not present

## 2016-04-25 LAB — COLOGUARD: Cologuard: NEGATIVE

## 2016-05-29 ENCOUNTER — Other Ambulatory Visit: Payer: Self-pay | Admitting: Internal Medicine

## 2016-07-18 ENCOUNTER — Encounter: Payer: Self-pay | Admitting: Internal Medicine

## 2016-07-18 ENCOUNTER — Ambulatory Visit (INDEPENDENT_AMBULATORY_CARE_PROVIDER_SITE_OTHER): Payer: Medicare Other | Admitting: Internal Medicine

## 2016-07-18 DIAGNOSIS — M5440 Lumbago with sciatica, unspecified side: Secondary | ICD-10-CM | POA: Diagnosis not present

## 2016-07-18 DIAGNOSIS — E559 Vitamin D deficiency, unspecified: Secondary | ICD-10-CM | POA: Diagnosis not present

## 2016-07-18 DIAGNOSIS — G35 Multiple sclerosis: Secondary | ICD-10-CM | POA: Diagnosis not present

## 2016-07-18 DIAGNOSIS — G8929 Other chronic pain: Secondary | ICD-10-CM

## 2016-07-18 DIAGNOSIS — K219 Gastro-esophageal reflux disease without esophagitis: Secondary | ICD-10-CM | POA: Diagnosis not present

## 2016-07-18 DIAGNOSIS — G4700 Insomnia, unspecified: Secondary | ICD-10-CM

## 2016-07-18 MED ORDER — GABAPENTIN 100 MG PO CAPS: 200.0000 mg | ORAL_CAPSULE | Freq: Three times a day (TID) | ORAL | 3 refills | Status: DC | PRN

## 2016-07-18 MED ORDER — TRAMADOL HCL (ER BIPHASIC) 200 MG PO CP24: 1.0000 | ORAL_CAPSULE | Freq: Every day | ORAL | 1 refills | Status: DC

## 2016-07-18 NOTE — Assessment & Plan Note (Signed)
Tramadol prn 

## 2016-07-18 NOTE — Assessment & Plan Note (Signed)
Tramadol prn Potential benefits of a long term Tramadol/opioids use as well as potential risks (i.e. addiction risk, apnea etc) and complications (i.e. Somnolence, constipation and others) were explained to the patient and were aknowledged. On Baclofen

## 2016-07-18 NOTE — Assessment & Plan Note (Signed)
Vit D 

## 2016-07-18 NOTE — Assessment & Plan Note (Signed)
protonix

## 2016-07-18 NOTE — Progress Notes (Signed)
Subjective:  Patient ID: [name], male    DOB: September 02, 1955  Age: 61 y.o. MRN: [id number]  CC: No chief complaint on file.   HPI [name] presents for MS, GERD, neuropathy f/u  Outpatient Medications Prior to Visit  Medication Sig Dispense Refill  . amitriptyline (ELAVIL) 25 MG tablet Take 50 mg by mouth at bedtime as needed for sleep.     . baclofen (LIORESAL) 20 MG tablet Take 20 mg by mouth 4 (four) times daily as needed for muscle spasms.    . cetirizine (ZYRTEC) 10 MG tablet Take 10 mg by mouth daily as needed for allergies.     . cholecalciferol (VITAMIN D) 1000 units tablet Take 2,000 Units by mouth 2 (two) times daily.    . clotrimazole-betamethasone (LOTRISONE) cream Apply 1 application topically 2 (two) times daily as needed (for irritation).    [name] dalfampridine (AMPYRA) 10 MG TB12 Take 1 tablet (10 mg total) by mouth 2 (two) times daily. 180 tablet 3  . fluticasone (FLONASE) 50 MCG/ACT nasal spray Place 2 sprays into both nostrils daily as needed for rhinitis.     [name] gabapentin (NEURONTIN) 100 MG capsule Take 200 mg by mouth 3 (three) times daily as needed (for pain).    [name] ketorolac (TORADOL) 10 MG tablet Take 1 tablet (10 mg total) by mouth daily as needed for severe pain. 30 tablet 2  . Multiple Vitamin (MULTIVITAMIN WITH MINERALS) TABS tablet Take 1 tablet by mouth daily.    [name] ocrelizumab 600 mg in sodium chloride 0.9 % 500 mL Inject 600 mg into the vein every 6 (six) months.    . pantoprazole (PROTONIX) 40 MG tablet TAKE 1 TABLET DAILY 90 tablet 2  . tiZANidine (ZANAFLEX) 4 MG tablet Take 4 mg by mouth every 6 (six) hours as needed for muscle spasms.    . TraMADol HCl 200 MG CP24 Take 1 capsule by mouth daily. 90 capsule 1   No facility-administered medications prior to visit.     ROS Review of Systems  Constitutional: Negative for appetite change, fatigue and unexpected weight change.  HENT: Negative for congestion, nosebleeds, sneezing, sore throat and  trouble swallowing.   Eyes: Negative for itching and visual disturbance.  Respiratory: Negative for cough.   Cardiovascular: Negative for chest pain, palpitations and leg swelling.  Gastrointestinal: Negative for abdominal distention, blood in stool, diarrhea and nausea.  Genitourinary: Negative for frequency and hematuria.  Musculoskeletal: Positive for gait problem. Negative for back pain, joint swelling and neck pain.  Skin: Negative for rash.  Neurological: Positive for weakness. Negative for dizziness, tremors and speech difficulty.  Psychiatric/Behavioral: Negative for agitation, dysphoric mood and sleep disturbance. The patient is not nervous/anxious.     Objective:  BP 126/82 (BP Location: Left Arm, Patient Position: Sitting, Cuff Size: Normal)   Pulse 77   Temp 98.4 F (36.9 C) (Oral)   Ht 5\' 7"  (1.702 m)   Wt 165 lb (74.8 kg)   SpO2 99%   BMI 25.84 kg/m   BP Readings from Last 3 Encounters:  07/18/16 126/82  03/29/16 120/84  03/21/16 115/64    Wt Readings from Last 3 Encounters:  07/18/16 165 lb (74.8 kg)  03/29/16 154 lb 8 oz (70.1 kg)  03/20/16 152 lb 8.9 oz (69.2 kg)    Physical Exam  Constitutional: He is oriented to person, place, and time. He appears well-developed. No distress.  NAD  HENT:  Mouth/Throat: Oropharynx is clear and moist.  Eyes: Conjunctivae  are normal. Pupils are equal, round, and reactive to light.  Neck: Normal range of motion. No JVD present. No thyromegaly present.  Cardiovascular: Normal rate, regular rhythm, normal heart sounds and intact distal pulses.  Exam reveals no gallop and no friction rub.   No murmur heard. Pulmonary/Chest: Effort normal and breath sounds normal. No respiratory distress. He has no wheezes. He has no rales. He exhibits no tenderness.  Abdominal: Soft. Bowel sounds are normal. He exhibits no distension and no mass. There is no tenderness. There is no rebound and no guarding.  Musculoskeletal: Normal range of  motion. He exhibits tenderness. He exhibits no edema.  Lymphadenopathy:    He has no cervical adenopathy.  Neurological: He is alert and oriented to person, place, and time. He has normal reflexes. No cranial nerve deficit. He exhibits normal muscle tone. He displays a negative Romberg sign. Coordination and gait normal.  Skin: Skin is warm and dry. No rash noted.  Psychiatric: He has a normal mood and affect. His behavior is normal. Judgment and thought content normal.  spastic gait Cane  Lab Results  Component Value Date   WBC 7.0 03/21/2016   HGB 13.8 03/21/2016   HCT 40.1 03/21/2016   PLT 167 03/21/2016   GLUCOSE 98 03/21/2016   CHOL 177 03/26/2015   TRIG 158.0 (H) 03/26/2015   HDL 46.00 03/26/2015   LDLDIRECT 118.4 06/09/2009   LDLCALC 99 03/26/2015   ALT 26 03/20/2016   AST 19 03/20/2016   NA 140 03/21/2016   K 3.6 03/21/2016   CL 108 03/21/2016   CREATININE 0.75 03/21/2016   BUN 16 03/21/2016   CO2 27 03/21/2016   TSH 0.851 03/20/2016   PSA 1.50 03/26/2015   INR 1.0 08/24/2008    No results found.  Assessment & Plan:   There are no diagnoses linked to this encounter. I am having Mr. Percell Miller maintain his fluticasone, cetirizine, dalfampridine, TraMADol HCl, amitriptyline, baclofen, clotrimazole-betamethasone, cholecalciferol, gabapentin, tiZANidine, multivitamin with minerals, ocrelizumab 600 mg in sodium chloride 0.9 % 500 mL, ketorolac, and pantoprazole.  No orders of the defined types were placed in this encounter.    Follow-up: No Follow-up on file.  Walker Kehr, MD

## 2016-07-18 NOTE — Assessment & Plan Note (Signed)
Doing fair 

## 2016-07-27 DIAGNOSIS — G35 Multiple sclerosis: Secondary | ICD-10-CM | POA: Diagnosis not present

## 2016-07-27 DIAGNOSIS — Z79899 Other long term (current) drug therapy: Secondary | ICD-10-CM | POA: Diagnosis not present

## 2016-07-31 DIAGNOSIS — G35 Multiple sclerosis: Secondary | ICD-10-CM | POA: Diagnosis not present

## 2016-07-31 DIAGNOSIS — Z79899 Other long term (current) drug therapy: Secondary | ICD-10-CM | POA: Diagnosis not present

## 2016-08-24 ENCOUNTER — Other Ambulatory Visit: Payer: Self-pay | Admitting: Internal Medicine

## 2016-10-06 ENCOUNTER — Other Ambulatory Visit: Payer: Self-pay | Admitting: *Deleted

## 2016-10-14 ENCOUNTER — Encounter: Payer: Self-pay | Admitting: Internal Medicine

## 2016-10-14 ENCOUNTER — Other Ambulatory Visit: Payer: Self-pay | Admitting: Internal Medicine

## 2016-10-19 MED ORDER — TRAMADOL HCL (ER BIPHASIC) 200 MG PO CP24: 1.0000 | ORAL_CAPSULE | Freq: Every day | ORAL | 1 refills | Status: DC

## 2016-11-15 ENCOUNTER — Other Ambulatory Visit (INDEPENDENT_AMBULATORY_CARE_PROVIDER_SITE_OTHER): Payer: Medicare Other

## 2016-11-15 ENCOUNTER — Ambulatory Visit (INDEPENDENT_AMBULATORY_CARE_PROVIDER_SITE_OTHER): Payer: Medicare Other | Admitting: Internal Medicine

## 2016-11-15 ENCOUNTER — Encounter: Payer: Self-pay | Admitting: Internal Medicine

## 2016-11-15 VITALS — BP 124/82 | HR 78 | Temp 98.4°F | Ht 67.0 in | Wt 168.0 lb

## 2016-11-15 DIAGNOSIS — R269 Unspecified abnormalities of gait and mobility: Secondary | ICD-10-CM | POA: Diagnosis not present

## 2016-11-15 DIAGNOSIS — M5440 Lumbago with sciatica, unspecified side: Secondary | ICD-10-CM

## 2016-11-15 DIAGNOSIS — Z23 Encounter for immunization: Secondary | ICD-10-CM

## 2016-11-15 DIAGNOSIS — G8929 Other chronic pain: Secondary | ICD-10-CM

## 2016-11-15 DIAGNOSIS — E559 Vitamin D deficiency, unspecified: Secondary | ICD-10-CM | POA: Diagnosis not present

## 2016-11-15 DIAGNOSIS — D72829 Elevated white blood cell count, unspecified: Secondary | ICD-10-CM | POA: Diagnosis not present

## 2016-11-15 DIAGNOSIS — G35 Multiple sclerosis: Secondary | ICD-10-CM

## 2016-11-15 LAB — CBC WITH DIFFERENTIAL/PLATELET
BASOS ABS: 0 10*3/uL (ref 0.0–0.1)
Basophils Relative: 0.8 % (ref 0.0–3.0)
EOS PCT: 1.6 % (ref 0.0–5.0)
Eosinophils Absolute: 0.1 10*3/uL (ref 0.0–0.7)
HCT: 45.2 % (ref 39.0–52.0)
HEMOGLOBIN: 15.1 g/dL (ref 13.0–17.0)
LYMPHS ABS: 1.1 10*3/uL (ref 0.7–4.0)
Lymphocytes Relative: 20.3 % (ref 12.0–46.0)
MCHC: 33.5 g/dL (ref 30.0–36.0)
MCV: 92.6 fl (ref 78.0–100.0)
MONO ABS: 0.5 10*3/uL (ref 0.1–1.0)
MONOS PCT: 9.5 % (ref 3.0–12.0)
NEUTROS PCT: 67.8 % (ref 43.0–77.0)
Neutro Abs: 3.5 10*3/uL (ref 1.4–7.7)
Platelets: 212 10*3/uL (ref 150.0–400.0)
RBC: 4.88 Mil/uL (ref 4.22–5.81)
RDW: 12.6 % (ref 11.5–15.5)
WBC: 5.2 10*3/uL (ref 4.0–10.5)

## 2016-11-15 LAB — HEPATIC FUNCTION PANEL
ALBUMIN: 4.5 g/dL (ref 3.5–5.2)
ALK PHOS: 65 U/L (ref 39–117)
ALT: 13 U/L (ref 0–53)
AST: 11 U/L (ref 0–37)
BILIRUBIN DIRECT: 0.1 mg/dL (ref 0.0–0.3)
Total Bilirubin: 0.5 mg/dL (ref 0.2–1.2)
Total Protein: 7.3 g/dL (ref 6.0–8.3)

## 2016-11-15 LAB — BASIC METABOLIC PANEL
BUN: 14 mg/dL (ref 6–23)
CHLORIDE: 102 meq/L (ref 96–112)
CO2: 31 meq/L (ref 19–32)
Calcium: 9.3 mg/dL (ref 8.4–10.5)
Creatinine, Ser: 0.86 mg/dL (ref 0.40–1.50)
GFR: 95.84 mL/min (ref >60.00)
GLUCOSE: 103 mg/dL — AB (ref 70–99)
Potassium: 4.2 mEq/L (ref 3.5–5.1)
SODIUM: 138 meq/L (ref 135–145)

## 2016-11-15 MED ORDER — TIZANIDINE HCL 4 MG PO TABS: ORAL_TABLET | ORAL | 2 refills | Status: DC

## 2016-11-15 NOTE — Assessment & Plan Note (Signed)
  [name] has declined and is now not able to walk. He is using his power chair at home more. He can do a few steps, but has to rely on someone to hold onto him. He probably will not continue with the infusions of Ocrevus because he is not receiving any benefit from them - last had it in June 2018   Flu shot

## 2016-11-15 NOTE — Assessment & Plan Note (Signed)
Worse Cane, Power w/c

## 2016-11-15 NOTE — Assessment & Plan Note (Signed)
Vit D 

## 2016-11-15 NOTE — Addendum Note (Signed)
Addended by: Karren Cobble on: 11/15/2016 04:09 PM   Modules accepted: Orders

## 2016-11-15 NOTE — Progress Notes (Signed)
Subjective:  Patient ID: [name], male    DOB: 12/16/1955  Age: 61 y.o. MRN: [id number]  CC: No chief complaint on file.   HPI Shivank Pinedo presents for MS,spasma, chronic LBP  Djibril has declined and is now not able to walk. He is using his power chair at home more. He can do a few steps, but has to rely on someone to hold onto him. He probably will not continue with the infusions of Ocrevus because he is not receiving any benefit from them - last had it in June 2018   Outpatient Medications Prior to Visit  Medication Sig Dispense Refill  . amitriptyline (ELAVIL) 25 MG tablet Take 50 mg by mouth at bedtime as needed for sleep.     . baclofen (LIORESAL) 20 MG tablet Take 20 mg by mouth 4 (four) times daily as needed for muscle spasms.    . cetirizine (ZYRTEC) 10 MG tablet Take 10 mg by mouth daily as needed for allergies.     . cholecalciferol (VITAMIN D) 1000 units tablet Take 2,000 Units by mouth 2 (two) times daily.    . clotrimazole-betamethasone (LOTRISONE) cream Apply 1 application topically 2 (two) times daily as needed (for irritation).    [name] dalfampridine (AMPYRA) 10 MG TB12 Take 1 tablet (10 mg total) by mouth 2 (two) times daily. 180 tablet 3  . fluticasone (FLONASE) 50 MCG/ACT nasal spray Place 2 sprays into both nostrils daily as needed for rhinitis.     [name] gabapentin (NEURONTIN) 100 MG capsule Take 2 capsules (200 mg total) by mouth 3 (three) times daily as needed (for pain). 180 capsule 3  . ketorolac (TORADOL) 10 MG tablet Take 1 tablet (10 mg total) by mouth daily as needed for severe pain. 30 tablet 2  . Multiple Vitamin (MULTIVITAMIN WITH MINERALS) TABS tablet Take 1 tablet by mouth daily.    [name] ocrelizumab 600 mg in sodium chloride 0.9 % 500 mL Inject 600 mg into the vein every 6 (six) months.    . pantoprazole (PROTONIX) 40 MG tablet TAKE 1 TABLET DAILY 90 tablet 2  . tiZANidine (ZANAFLEX) 4 MG tablet TAKE 1 TABLET EVERY 6 HOURS AS NEEDED FOR MUSCLE SPASMS 120  tablet 0  . TraMADol HCl 200 MG CP24 Take 1 capsule by mouth daily. 90 capsule 1   No facility-administered medications prior to visit.     ROS Review of Systems  Constitutional: Positive for fatigue. Negative for appetite change and unexpected weight change.  HENT: Negative for congestion, nosebleeds, sneezing, sore throat and trouble swallowing.   Eyes: Negative for itching and visual disturbance.  Respiratory: Negative for cough.   Cardiovascular: Negative for chest pain, palpitations and leg swelling.  Gastrointestinal: Negative for abdominal distention, blood in stool, diarrhea and nausea.  Genitourinary: Negative for frequency and hematuria.  Musculoskeletal: Positive for arthralgias, back pain, gait problem, myalgias and neck pain. Negative for joint swelling.  Skin: Positive for rash.  Neurological: Positive for weakness. Negative for dizziness, tremors and speech difficulty.  Psychiatric/Behavioral: Negative for agitation, dysphoric mood, sleep disturbance and suicidal ideas. The patient is not nervous/anxious.     Objective:  BP 124/82 (BP Location: Left Arm, Patient Position: Sitting, Cuff Size: Large)   Pulse 78   Temp 98.4 F (36.9 C) (Oral)   Ht 5\' 7"  (1.702 m)   Wt 168 lb (76.2 kg)   SpO2 98%   BMI 26.31 kg/m   BP Readings from Last 3 Encounters:  11/15/16 124/82  07/18/16 126/82  03/29/16 120/84    Wt Readings from Last 3 Encounters:  11/15/16 168 lb (76.2 kg)  07/18/16 165 lb (74.8 kg)  03/29/16 154 lb 8 oz (70.1 kg)    Physical Exam  Constitutional: He is oriented to person, place, and time. He appears well-developed. No distress.  NAD  HENT:  Mouth/Throat: Oropharynx is clear and moist.  Eyes: Pupils are equal, round, and reactive to light. Conjunctivae are normal.  Neck: Normal range of motion. No JVD present. No thyromegaly present.  Cardiovascular: Normal rate, regular rhythm, normal heart sounds and intact distal pulses.  Exam reveals no  gallop and no friction rub.   No murmur heard. Pulmonary/Chest: Effort normal and breath sounds normal. No respiratory distress. He has no wheezes. He has no rales. He exhibits no tenderness.  Abdominal: Soft. Bowel sounds are normal. He exhibits no distension and no mass. There is no tenderness. There is no rebound and no guarding.  Musculoskeletal: Normal range of motion. He exhibits tenderness. He exhibits no edema.  Lymphadenopathy:    He has no cervical adenopathy.  Neurological: He is alert and oriented to person, place, and time. He has normal reflexes. No cranial nerve deficit. He exhibits normal muscle tone. He displays a negative Romberg sign. Coordination abnormal. Gait normal.  Skin: Skin is warm and dry. No rash noted.  Psychiatric: He has a normal mood and affect. His behavior is normal. Judgment and thought content normal.  cane Spastic gait  Lab Results  Component Value Date   WBC 7.0 03/21/2016   HGB 13.8 03/21/2016   HCT 40.1 03/21/2016   PLT 167 03/21/2016   GLUCOSE 98 03/21/2016   CHOL 177 03/26/2015   TRIG 158.0 (H) 03/26/2015   HDL 46.00 03/26/2015   LDLDIRECT 118.4 06/09/2009   LDLCALC 99 03/26/2015   ALT 26 03/20/2016   AST 19 03/20/2016   NA 140 03/21/2016   K 3.6 03/21/2016   CL 108 03/21/2016   CREATININE 0.75 03/21/2016   BUN 16 03/21/2016   CO2 27 03/21/2016   TSH 0.851 03/20/2016   PSA 1.50 03/26/2015   INR 1.0 08/24/2008    No results found.  Assessment & Plan:   There are no diagnoses linked to this encounter. I am having Mr. Percell Miller maintain his fluticasone, cetirizine, dalfampridine, amitriptyline, baclofen, clotrimazole-betamethasone, cholecalciferol, multivitamin with minerals, ocrelizumab 600 mg in sodium chloride 0.9 % 500 mL, ketorolac, pantoprazole, gabapentin, tiZANidine, and TraMADol HCl.  No orders of the defined types were placed in this encounter.    Follow-up: No Follow-up on file.  Walker Kehr, MD

## 2016-11-15 NOTE — Assessment & Plan Note (Signed)
CBC

## 2016-11-15 NOTE — Assessment & Plan Note (Signed)
On Tramadol Potential benefits of a long term Tramadol/opioids use as well as potential risks (i.e. addiction risk, apnea etc) and complications (i.e. Somnolence, constipation and others) were explained to the patient and were aknowledged. On Baclofen

## 2017-01-31 ENCOUNTER — Encounter: Payer: Self-pay | Admitting: Internal Medicine

## 2017-02-01 ENCOUNTER — Other Ambulatory Visit: Payer: Self-pay | Admitting: Internal Medicine

## 2017-02-01 MED ORDER — TRAMADOL HCL (ER BIPHASIC) 200 MG PO CP24: 1.0000 | ORAL_CAPSULE | Freq: Every day | ORAL | 1 refills | Status: DC

## 2017-02-02 DIAGNOSIS — Z79899 Other long term (current) drug therapy: Secondary | ICD-10-CM | POA: Diagnosis not present

## 2017-02-02 DIAGNOSIS — G35 Multiple sclerosis: Secondary | ICD-10-CM | POA: Diagnosis not present

## 2017-02-23 ENCOUNTER — Other Ambulatory Visit: Payer: Self-pay | Admitting: Internal Medicine

## 2017-04-06 ENCOUNTER — Encounter: Payer: Self-pay | Admitting: Internal Medicine

## 2017-04-08 ENCOUNTER — Other Ambulatory Visit: Payer: Self-pay | Admitting: Internal Medicine

## 2017-04-08 MED ORDER — TRAMADOL HCL (ER BIPHASIC) 200 MG PO CP24: 1.0000 | ORAL_CAPSULE | Freq: Every day | ORAL | 1 refills | Status: DC

## 2017-05-16 ENCOUNTER — Encounter: Payer: Self-pay | Admitting: Internal Medicine

## 2017-05-16 ENCOUNTER — Other Ambulatory Visit (INDEPENDENT_AMBULATORY_CARE_PROVIDER_SITE_OTHER): Payer: Medicare Other

## 2017-05-16 ENCOUNTER — Ambulatory Visit (INDEPENDENT_AMBULATORY_CARE_PROVIDER_SITE_OTHER): Payer: Medicare Other | Admitting: Internal Medicine

## 2017-05-16 DIAGNOSIS — J4520 Mild intermittent asthma, uncomplicated: Secondary | ICD-10-CM | POA: Diagnosis not present

## 2017-05-16 DIAGNOSIS — G8929 Other chronic pain: Secondary | ICD-10-CM

## 2017-05-16 DIAGNOSIS — G35 Multiple sclerosis: Secondary | ICD-10-CM | POA: Diagnosis not present

## 2017-05-16 DIAGNOSIS — M5440 Lumbago with sciatica, unspecified side: Secondary | ICD-10-CM | POA: Diagnosis not present

## 2017-05-16 DIAGNOSIS — R269 Unspecified abnormalities of gait and mobility: Secondary | ICD-10-CM

## 2017-05-16 LAB — BASIC METABOLIC PANEL
BUN: 13 mg/dL (ref 6–23)
CALCIUM: 8.8 mg/dL (ref 8.4–10.5)
CO2: 30 mEq/L (ref 19–32)
Chloride: 104 mEq/L (ref 96–112)
Creatinine, Ser: 0.84 mg/dL (ref 0.40–1.50)
GFR: 98.32 mL/min (ref >60.00)
GLUCOSE: 103 mg/dL — AB (ref 70–99)
Potassium: 4.3 mEq/L (ref 3.5–5.1)
Sodium: 139 mEq/L (ref 135–145)

## 2017-05-16 LAB — HEPATIC FUNCTION PANEL
ALT: 12 U/L (ref 0–53)
AST: 10 U/L (ref 0–37)
Albumin: 4.1 g/dL (ref 3.5–5.2)
Alkaline Phosphatase: 76 U/L (ref 39–117)
BILIRUBIN DIRECT: 0.1 mg/dL (ref 0.0–0.3)
BILIRUBIN TOTAL: 0.3 mg/dL (ref 0.2–1.2)
TOTAL PROTEIN: 6.9 g/dL (ref 6.0–8.3)

## 2017-05-16 NOTE — Assessment & Plan Note (Signed)
Kasandra Knudsen, Power w/c

## 2017-05-16 NOTE — Assessment & Plan Note (Addendum)
No MS meds Baclofen, Tramadol prn  Read about Naltrexone 5 mg a day and Biotin 300 mg a day for MS

## 2017-05-16 NOTE — Assessment & Plan Note (Signed)
Better w/warm weather

## 2017-05-16 NOTE — Progress Notes (Signed)
Subjective:  Patient ID: [name], male    DOB: [birth year]  Age: 62 y.o. MRN: [id number]  CC: No chief complaint on file.   HPI [name] presents for MS, chronic pain, spasms f/u  Outpatient Medications Prior to Visit  Medication Sig Dispense Refill  . amitriptyline (ELAVIL) 25 MG tablet Take 50 mg by mouth at bedtime as needed for sleep.     . baclofen (LIORESAL) 20 MG tablet Take 20 mg by mouth 4 (four) times daily as needed for muscle spasms.    . cetirizine (ZYRTEC) 10 MG tablet Take 10 mg by mouth daily as needed for allergies.     . cholecalciferol (VITAMIN D) 1000 units tablet Take 2,000 Units by mouth 2 (two) times daily.    . clotrimazole-betamethasone (LOTRISONE) cream Apply 1 application topically 2 (two) times daily as needed (for irritation).    [name] dalfampridine (AMPYRA) 10 MG TB12 Take 1 tablet (10 mg total) by mouth 2 (two) times daily. 180 tablet 3  . fluticasone (FLONASE) 50 MCG/ACT nasal spray Place 2 sprays into both nostrils daily as needed for rhinitis.     [name] gabapentin (NEURONTIN) 100 MG capsule Take 2 capsules (200 mg total) by mouth 3 (three) times daily as needed (for pain). 180 capsule 3  . ketorolac (TORADOL) 10 MG tablet Take 1 tablet (10 mg total) by mouth daily as needed for severe pain. 30 tablet 2  . Multiple Vitamin (MULTIVITAMIN WITH MINERALS) TABS tablet Take 1 tablet by mouth daily.    [name] ocrelizumab 600 mg in sodium chloride 0.9 % 500 mL Inject 600 mg into the vein every 6 (six) months.    . pantoprazole (PROTONIX) 40 MG tablet TAKE 1 TABLET DAILY 90 tablet 2  . TraMADol HCl 200 MG CP24 Take 1 capsule by mouth daily. 90 capsule 1  . tiZANidine (ZANAFLEX) 4 MG tablet TAKE 1 TABLET EVERY 6 HOURS AS NEEDED FOR MUSCLE SPASMS 120 tablet 2   No facility-administered medications prior to visit.     ROS Review of Systems  Constitutional: Positive for fatigue. Negative for appetite change and unexpected weight change.  HENT: Negative for  congestion, nosebleeds, sneezing, sore throat and trouble swallowing.   Eyes: Negative for itching and visual disturbance.  Respiratory: Negative for cough.   Cardiovascular: Negative for chest pain, palpitations and leg swelling.  Gastrointestinal: Negative for abdominal distention, blood in stool, diarrhea and nausea.  Genitourinary: Negative for frequency and hematuria.  Musculoskeletal: Positive for arthralgias, back pain, gait problem and neck stiffness. Negative for joint swelling and neck pain.  Skin: Negative for rash.  Neurological: Positive for weakness. Negative for dizziness, tremors and speech difficulty.  Psychiatric/Behavioral: Negative for agitation, dysphoric mood, sleep disturbance and suicidal ideas. The patient is not nervous/anxious.     Objective:  BP 124/82 (BP Location: Left Arm, Patient Position: Sitting, Cuff Size: Normal)   Pulse 77   Temp 98.2 F (36.8 C) (Oral)   Ht 5\' 7"  (1.702 m)   Wt 164 lb (74.4 kg)   SpO2 98%   BMI 25.69 kg/m   BP Readings from Last 3 Encounters:  05/16/17 124/82  11/15/16 124/82  07/18/16 126/82    Wt Readings from Last 3 Encounters:  05/16/17 164 lb (74.4 kg)  11/15/16 168 lb (76.2 kg)  07/18/16 165 lb (74.8 kg)    Physical Exam  Constitutional: He is oriented to person, place, and time. He appears well-developed. No distress.  NAD  HENT:  Mouth/Throat: Oropharynx  is clear and moist.  Eyes: Pupils are equal, round, and reactive to light. Conjunctivae are normal.  Neck: Normal range of motion. No JVD present. No thyromegaly present.  Cardiovascular: Normal rate, regular rhythm, normal heart sounds and intact distal pulses. Exam reveals no gallop and no friction rub.  No murmur heard. Pulmonary/Chest: Effort normal and breath sounds normal. No respiratory distress. He has no wheezes. He has no rales. He exhibits no tenderness.  Abdominal: Soft. Bowel sounds are normal. He exhibits no distension and no mass. There is no  tenderness. There is no rebound and no guarding.  Musculoskeletal: Normal range of motion. He exhibits tenderness. He exhibits no edema.  Lymphadenopathy:    He has no cervical adenopathy.  Neurological: He is alert and oriented to person, place, and time. He displays abnormal reflex. No cranial nerve deficit. He exhibits abnormal muscle tone. He displays a negative Romberg sign. Coordination abnormal. Gait normal.  Skin: Skin is warm and dry. No rash noted.  Psychiatric: He has a normal mood and affect. His behavior is normal. Judgment and thought content normal.    Lab Results  Component Value Date   WBC 5.2 11/15/2016   HGB 15.1 11/15/2016   HCT 45.2 11/15/2016   PLT 212.0 11/15/2016   GLUCOSE 103 (H) 11/15/2016   CHOL 177 03/26/2015   TRIG 158.0 (H) 03/26/2015   HDL 46.00 03/26/2015   LDLDIRECT 118.4 06/09/2009   LDLCALC 99 03/26/2015   ALT 13 11/15/2016   AST 11 11/15/2016   NA 138 11/15/2016   K 4.2 11/15/2016   CL 102 11/15/2016   CREATININE 0.86 11/15/2016   BUN 14 11/15/2016   CO2 31 11/15/2016   TSH 0.851 03/20/2016   PSA 1.50 03/26/2015   INR 1.0 08/24/2008    No results found.  Assessment & Plan:   There are no diagnoses linked to this encounter. I have discontinued Simona Huh Guilbault's tiZANidine. I am also having him maintain his fluticasone, cetirizine, dalfampridine, amitriptyline, baclofen, clotrimazole-betamethasone, cholecalciferol, multivitamin with minerals, ocrelizumab 600 mg in sodium chloride 0.9 % 500 mL, ketorolac, gabapentin, pantoprazole, and TraMADol HCl.  No orders of the defined types were placed in this encounter.    Follow-up: No follow-ups on file.  Walker Kehr, MD

## 2017-05-16 NOTE — Assessment & Plan Note (Signed)
Tramadol prn ° Potential benefits of a long term opioids use as well as potential risks (i.e. addiction risk, apnea etc) and complications (i.e. Somnolence, constipation and others) were explained to the patient and were aknowledged. ° ° °

## 2017-05-16 NOTE — Patient Instructions (Addendum)
Read about Naltrexone 5 mg a day and Biotin 300 mg a day for MS   [facility] well w/Jill

## 2017-08-23 DIAGNOSIS — Z79899 Other long term (current) drug therapy: Secondary | ICD-10-CM | POA: Diagnosis not present

## 2017-08-23 DIAGNOSIS — G35 Multiple sclerosis: Secondary | ICD-10-CM | POA: Diagnosis not present

## 2017-08-25 ENCOUNTER — Encounter: Payer: Self-pay | Admitting: Internal Medicine

## 2017-08-27 ENCOUNTER — Other Ambulatory Visit: Payer: Self-pay | Admitting: Internal Medicine

## 2017-08-27 MED ORDER — BACLOFEN 20 MG PO TABS: 20.0000 mg | ORAL_TABLET | Freq: Four times a day (QID) | ORAL | 3 refills | Status: DC | PRN

## 2017-08-28 MED ORDER — PANTOPRAZOLE SODIUM 40 MG PO TBEC: 40.0000 mg | DELAYED_RELEASE_TABLET | Freq: Every day | ORAL | 3 refills | Status: DC

## 2017-08-28 NOTE — Telephone Encounter (Signed)
[facility]. Pls advise on amitriptyline script has not been renewed by MD../lmb

## 2017-08-29 ENCOUNTER — Other Ambulatory Visit: Payer: Self-pay | Admitting: Internal Medicine

## 2017-08-29 MED ORDER — AMITRIPTYLINE HCL 25 MG PO TABS: 50.0000 mg | ORAL_TABLET | Freq: Every evening | ORAL | 3 refills | Status: DC | PRN

## 2017-09-15 ENCOUNTER — Other Ambulatory Visit: Payer: Self-pay | Admitting: Internal Medicine

## 2017-09-15 MED ORDER — KETOROLAC TROMETHAMINE 10 MG PO TABS: 10.0000 mg | ORAL_TABLET | Freq: Every day | ORAL | 2 refills | Status: DC | PRN

## 2017-09-27 DIAGNOSIS — T1502XA Foreign body in cornea, left eye, initial encounter: Secondary | ICD-10-CM | POA: Diagnosis not present

## 2017-09-28 DIAGNOSIS — T1502XD Foreign body in cornea, left eye, subsequent encounter: Secondary | ICD-10-CM | POA: Diagnosis not present

## 2017-10-30 DIAGNOSIS — H01025 Squamous blepharitis left lower eyelid: Secondary | ICD-10-CM | POA: Diagnosis not present

## 2017-10-30 DIAGNOSIS — H4720 Unspecified optic atrophy: Secondary | ICD-10-CM | POA: Diagnosis not present

## 2017-10-30 DIAGNOSIS — H2513 Age-related nuclear cataract, bilateral: Secondary | ICD-10-CM | POA: Diagnosis not present

## 2017-10-30 DIAGNOSIS — H01021 Squamous blepharitis right upper eyelid: Secondary | ICD-10-CM | POA: Diagnosis not present

## 2017-10-30 DIAGNOSIS — H01024 Squamous blepharitis left upper eyelid: Secondary | ICD-10-CM | POA: Diagnosis not present

## 2017-10-30 DIAGNOSIS — H01022 Squamous blepharitis right lower eyelid: Secondary | ICD-10-CM | POA: Diagnosis not present

## 2017-10-30 DIAGNOSIS — G35 Multiple sclerosis: Secondary | ICD-10-CM | POA: Diagnosis not present

## 2017-11-07 MED ORDER — TRAMADOL HCL (ER BIPHASIC) 200 MG PO CP24: 1.0000 | ORAL_CAPSULE | Freq: Every day | ORAL | 1 refills | Status: DC

## 2017-11-09 DIAGNOSIS — G35 Multiple sclerosis: Secondary | ICD-10-CM | POA: Insufficient documentation

## 2017-11-09 DIAGNOSIS — R2689 Other abnormalities of gait and mobility: Secondary | ICD-10-CM | POA: Diagnosis not present

## 2017-11-09 DIAGNOSIS — M62838 Other muscle spasm: Secondary | ICD-10-CM | POA: Diagnosis not present

## 2017-11-09 DIAGNOSIS — G8220 Paraplegia, unspecified: Secondary | ICD-10-CM | POA: Diagnosis not present

## 2017-11-14 ENCOUNTER — Encounter: Payer: Self-pay | Admitting: Internal Medicine

## 2017-11-14 ENCOUNTER — Other Ambulatory Visit (INDEPENDENT_AMBULATORY_CARE_PROVIDER_SITE_OTHER): Payer: Medicare Other

## 2017-11-14 ENCOUNTER — Ambulatory Visit (INDEPENDENT_AMBULATORY_CARE_PROVIDER_SITE_OTHER): Payer: Medicare Other | Admitting: Internal Medicine

## 2017-11-14 VITALS — BP 126/84 | HR 74 | Temp 98.2°F | Ht 67.0 in | Wt 164.0 lb

## 2017-11-14 DIAGNOSIS — N320 Bladder-neck obstruction: Secondary | ICD-10-CM

## 2017-11-14 DIAGNOSIS — E559 Vitamin D deficiency, unspecified: Secondary | ICD-10-CM

## 2017-11-14 DIAGNOSIS — M5440 Lumbago with sciatica, unspecified side: Secondary | ICD-10-CM

## 2017-11-14 DIAGNOSIS — R269 Unspecified abnormalities of gait and mobility: Secondary | ICD-10-CM

## 2017-11-14 DIAGNOSIS — R202 Paresthesia of skin: Secondary | ICD-10-CM | POA: Diagnosis not present

## 2017-11-14 DIAGNOSIS — G8929 Other chronic pain: Secondary | ICD-10-CM

## 2017-11-14 DIAGNOSIS — Z23 Encounter for immunization: Secondary | ICD-10-CM | POA: Diagnosis not present

## 2017-11-14 DIAGNOSIS — E785 Hyperlipidemia, unspecified: Secondary | ICD-10-CM

## 2017-11-14 LAB — CBC WITH DIFFERENTIAL/PLATELET
BASOS PCT: 0.7 % (ref 0.0–3.0)
Basophils Absolute: 0 10*3/uL (ref 0.0–0.1)
EOS PCT: 1.6 % (ref 0.0–5.0)
Eosinophils Absolute: 0.1 10*3/uL (ref 0.0–0.7)
HCT: 45.6 % (ref 39.0–52.0)
HEMOGLOBIN: 15.3 g/dL (ref 13.0–17.0)
LYMPHS PCT: 22.6 % (ref 12.0–46.0)
Lymphs Abs: 1.4 10*3/uL (ref 0.7–4.0)
MCHC: 33.6 g/dL (ref 30.0–36.0)
MCV: 92.4 fl (ref 78.0–100.0)
MONOS PCT: 6.9 % (ref 3.0–12.0)
Monocytes Absolute: 0.4 10*3/uL (ref 0.1–1.0)
Neutro Abs: 4.1 10*3/uL (ref 1.4–7.7)
Neutrophils Relative %: 68.2 % (ref 43.0–77.0)
Platelets: 187 10*3/uL (ref 150.0–400.0)
RBC: 4.93 Mil/uL (ref 4.22–5.81)
RDW: 12.6 % (ref 11.5–15.5)
WBC: 6 10*3/uL (ref 4.0–10.5)

## 2017-11-14 LAB — HEPATIC FUNCTION PANEL
ALBUMIN: 4.4 g/dL (ref 3.5–5.2)
ALT: 14 U/L (ref 0–53)
AST: 12 U/L (ref 0–37)
Alkaline Phosphatase: 87 U/L (ref 39–117)
Bilirubin, Direct: 0.1 mg/dL (ref 0.0–0.3)
TOTAL PROTEIN: 7.2 g/dL (ref 6.0–8.3)
Total Bilirubin: 0.4 mg/dL (ref 0.2–1.2)

## 2017-11-14 LAB — BASIC METABOLIC PANEL
BUN: 12 mg/dL (ref 6–23)
CHLORIDE: 102 meq/L (ref 96–112)
CO2: 32 meq/L (ref 19–32)
Calcium: 9.3 mg/dL (ref 8.4–10.5)
Creatinine, Ser: 0.84 mg/dL (ref 0.40–1.50)
GFR: 98.16 mL/min (ref >60.00)
Glucose, Bld: 103 mg/dL — ABNORMAL HIGH (ref 70–99)
POTASSIUM: 4.3 meq/L (ref 3.5–5.1)
Sodium: 140 mEq/L (ref 135–145)

## 2017-11-14 LAB — LIPID PANEL
CHOL/HDL RATIO: 4
Cholesterol: 182 mg/dL (ref 0–200)
HDL: 48.5 mg/dL (ref >39.00)
LDL Cholesterol: 112 mg/dL — ABNORMAL HIGH (ref 0–99)
NONHDL: 133.09
Triglycerides: 107 mg/dL (ref 0.0–149.0)
VLDL: 21.4 mg/dL (ref 0.0–40.0)

## 2017-11-14 LAB — TSH: TSH: 3.75 u[IU]/mL (ref 0.35–4.50)

## 2017-11-14 LAB — PSA: PSA: 2.75 ng/mL (ref 0.10–4.00)

## 2017-11-14 MED ORDER — GABAPENTIN 100 MG PO CAPS: 200.0000 mg | ORAL_CAPSULE | Freq: Three times a day (TID) | ORAL | 3 refills | Status: DC | PRN

## 2017-11-14 MED ORDER — PANTOPRAZOLE SODIUM 40 MG PO TBEC: 40.0000 mg | DELAYED_RELEASE_TABLET | Freq: Every day | ORAL | 3 refills | Status: DC

## 2017-11-14 MED ORDER — CLOTRIMAZOLE-BETAMETHASONE 1-0.05 % EX CREA: 1.0000 "application " | TOPICAL_CREAM | Freq: Two times a day (BID) | CUTANEOUS | 3 refills | Status: DC | PRN

## 2017-11-14 MED ORDER — BACLOFEN 20 MG PO TABS: 20.0000 mg | ORAL_TABLET | Freq: Four times a day (QID) | ORAL | 3 refills | Status: DC | PRN

## 2017-11-14 NOTE — Patient Instructions (Signed)
[facility] well w/[name] 

## 2017-11-14 NOTE — Assessment & Plan Note (Signed)
No change 

## 2017-11-14 NOTE — Assessment & Plan Note (Signed)
On Tramadol Potential benefits of a long term Tramadol/opioids use as well as potential risks (i.e. addiction risk, apnea etc) and complications (i.e. Somnolence, constipation and others) were explained to the patient and were aknowledged. On Baclofen

## 2017-11-14 NOTE — Assessment & Plan Note (Signed)
Vit D 

## 2017-11-14 NOTE — Progress Notes (Signed)
Subjective:  Patient ID: [name], male    DOB: 09-22-1955  Age: 62 y.o. MRN: [id number]  CC: No chief complaint on file.   HPI [name] presents for MS, spasms, pain f/u  Outpatient Medications Prior to Visit  Medication Sig Dispense Refill  . amitriptyline (ELAVIL) 25 MG tablet Take 2 tablets (50 mg total) by mouth at bedtime as needed for sleep. 180 tablet 3  . baclofen (LIORESAL) 20 MG tablet Take 1 tablet (20 mg total) by mouth 4 (four) times daily as needed for muscle spasms. 360 each 3  . cetirizine (ZYRTEC) 10 MG tablet Take 10 mg by mouth daily as needed for allergies.     . cholecalciferol (VITAMIN D) 1000 units tablet Take 2,000 Units by mouth 2 (two) times daily.    . clotrimazole-betamethasone (LOTRISONE) cream Apply 1 application topically 2 (two) times daily as needed (for irritation).    [name] dalfampridine (AMPYRA) 10 MG TB12 Take 1 tablet (10 mg total) by mouth 2 (two) times daily. 180 tablet 3  . fluticasone (FLONASE) 50 MCG/ACT nasal spray Place 2 sprays into both nostrils daily as needed for rhinitis.     [name] gabapentin (NEURONTIN) 100 MG capsule Take 2 capsules (200 mg total) by mouth 3 (three) times daily as needed (for pain). 180 capsule 3  . ketorolac (TORADOL) 10 MG tablet Take 1 tablet (10 mg total) by mouth daily as needed for severe pain. 30 tablet 2  . Multiple Vitamin (MULTIVITAMIN WITH MINERALS) TABS tablet Take 1 tablet by mouth daily.    [name] ocrelizumab 600 mg in sodium chloride 0.9 % 500 mL Inject 600 mg into the vein every 6 (six) months.    . pantoprazole (PROTONIX) 40 MG tablet Take 1 tablet (40 mg total) by mouth daily. 90 tablet 3  . TraMADol HCl 200 MG CP24 Take 1 capsule by mouth daily. 90 capsule 1   No facility-administered medications prior to visit.     ROS: Review of Systems  Constitutional: Positive for fatigue. Negative for appetite change and unexpected weight change.  HENT: Negative for congestion, nosebleeds, sneezing, sore  throat and trouble swallowing.   Eyes: Negative for itching and visual disturbance.  Respiratory: Negative for cough.   Cardiovascular: Negative for chest pain, palpitations and leg swelling.  Gastrointestinal: Negative for abdominal distention, blood in stool, diarrhea and nausea.  Genitourinary: Negative for frequency and hematuria.  Musculoskeletal: Positive for arthralgias, back pain, gait problem and neck stiffness. Negative for joint swelling and neck pain.  Skin: Negative for rash.  Neurological: Positive for weakness. Negative for dizziness, tremors and speech difficulty.  Psychiatric/Behavioral: Negative for agitation, dysphoric mood and sleep disturbance. The patient is not nervous/anxious.     Objective:  BP 126/84 (BP Location: Left Arm, Patient Position: Sitting, Cuff Size: Normal)   Pulse 74   Temp 98.2 F (36.8 C) (Oral)   Ht 5\' 7"  (1.702 m)   Wt 164 lb (74.4 kg)   SpO2 97%   BMI 25.69 kg/m   BP Readings from Last 3 Encounters:  11/14/17 126/84  05/16/17 124/82  11/15/16 124/82    Wt Readings from Last 3 Encounters:  11/14/17 164 lb (74.4 kg)  05/16/17 164 lb (74.4 kg)  11/15/16 168 lb (76.2 kg)    Physical Exam  Constitutional: He is oriented to person, place, and time. He appears well-developed. No distress.  NAD  HENT:  Mouth/Throat: Oropharynx is clear and moist.  Eyes: Pupils are equal, round, and reactive  to light. Conjunctivae are normal.  Neck: Normal range of motion. No JVD present. No thyromegaly present.  Cardiovascular: Normal rate, regular rhythm, normal heart sounds and intact distal pulses. Exam reveals no gallop and no friction rub.  No murmur heard. Pulmonary/Chest: Effort normal and breath sounds normal. No respiratory distress. He has no wheezes. He has no rales. He exhibits no tenderness.  Abdominal: Soft. Bowel sounds are normal. He exhibits no distension and no mass. There is tenderness. There is no rebound and no guarding.    Musculoskeletal: Normal range of motion. He exhibits no edema or tenderness.  Lymphadenopathy:    He has no cervical adenopathy.  Neurological: He is alert and oriented to person, place, and time. He displays abnormal reflex. A sensory deficit is present. No cranial nerve deficit. He exhibits normal muscle tone. He displays a negative Romberg sign. Coordination abnormal. Gait normal.  Skin: Skin is warm and dry. No rash noted.  Psychiatric: He has a normal mood and affect. His behavior is normal. Judgment and thought content normal.    Lab Results  Component Value Date   WBC 5.2 11/15/2016   HGB 15.1 11/15/2016   HCT 45.2 11/15/2016   PLT 212.0 11/15/2016   GLUCOSE 103 (H) 05/16/2017   CHOL 177 03/26/2015   TRIG 158.0 (H) 03/26/2015   HDL 46.00 03/26/2015   LDLDIRECT 118.4 06/09/2009   LDLCALC 99 03/26/2015   ALT 12 05/16/2017   AST 10 05/16/2017   NA 139 05/16/2017   K 4.3 05/16/2017   CL 104 05/16/2017   CREATININE 0.84 05/16/2017   BUN 13 05/16/2017   CO2 30 05/16/2017   TSH 0.851 03/20/2016   PSA 1.50 03/26/2015   INR 1.0 08/24/2008    No results found.  Assessment & Plan:   There are no diagnoses linked to this encounter.   No orders of the defined types were placed in this encounter.    Follow-up: No follow-ups on file.  Walker Kehr, MD

## 2017-11-14 NOTE — Addendum Note (Signed)
Addended by: Karren Cobble on: 11/14/2017 01:44 PM   Modules accepted: Orders

## 2017-11-14 NOTE — Assessment & Plan Note (Signed)
MS related [name]

## 2018-01-10 ENCOUNTER — Other Ambulatory Visit (INDEPENDENT_AMBULATORY_CARE_PROVIDER_SITE_OTHER): Payer: Medicare Other

## 2018-01-10 DIAGNOSIS — R202 Paresthesia of skin: Secondary | ICD-10-CM | POA: Diagnosis not present

## 2018-01-10 LAB — URINALYSIS
Bilirubin Urine: NEGATIVE
Hgb urine dipstick: NEGATIVE
Ketones, ur: NEGATIVE
Leukocytes, UA: NEGATIVE
NITRITE: NEGATIVE
SPECIFIC GRAVITY, URINE: 1.01 (ref 1.000–1.030)
TOTAL PROTEIN, URINE-UPE24: NEGATIVE
UROBILINOGEN UA: 0.2 (ref 0.0–1.0)
Urine Glucose: NEGATIVE
pH: 6 (ref 5.0–8.0)

## 2018-01-30 ENCOUNTER — Emergency Department (HOSPITAL_COMMUNITY)
Admission: EM | Admit: 2018-01-30 | Discharge: 2018-01-31 | Disposition: A | Discharge status: Home / Self Care | Payer: Medicare Other | Attending: Emergency Medicine | Admitting: Emergency Medicine

## 2018-01-30 ENCOUNTER — Encounter (HOSPITAL_COMMUNITY): Payer: Self-pay | Admitting: Emergency Medicine

## 2018-01-30 ENCOUNTER — Other Ambulatory Visit: Payer: Self-pay

## 2018-01-30 DIAGNOSIS — N200 Calculus of kidney: Secondary | ICD-10-CM

## 2018-01-30 DIAGNOSIS — G35 Multiple sclerosis: Secondary | ICD-10-CM | POA: Insufficient documentation

## 2018-01-30 DIAGNOSIS — N281 Cyst of kidney, acquired: Secondary | ICD-10-CM | POA: Diagnosis not present

## 2018-01-30 DIAGNOSIS — N3289 Other specified disorders of bladder: Secondary | ICD-10-CM | POA: Diagnosis not present

## 2018-01-30 DIAGNOSIS — R109 Unspecified abdominal pain: Secondary | ICD-10-CM | POA: Diagnosis present

## 2018-01-30 LAB — COMPREHENSIVE METABOLIC PANEL
ALT: 22 U/L (ref 0–44)
AST: 18 U/L (ref 15–41)
Albumin: 4.5 g/dL (ref 3.5–5.0)
Alkaline Phosphatase: 74 U/L (ref 38–126)
Anion gap: 10 (ref 5–15)
BILIRUBIN TOTAL: 0.5 mg/dL (ref 0.3–1.2)
BUN: 16 mg/dL (ref 8–23)
CO2: 28 mmol/L (ref 22–32)
Calcium: 9.1 mg/dL (ref 8.9–10.3)
Chloride: 103 mmol/L (ref 98–111)
Creatinine, Ser: 1.08 mg/dL (ref 0.61–1.24)
GFR calc Af Amer: >60 mL/min (ref >60)
GFR calc non Af Amer: >60 mL/min (ref >60)
Glucose, Bld: 138 mg/dL — ABNORMAL HIGH (ref 70–99)
Potassium: 4.1 mmol/L (ref 3.5–5.1)
Sodium: 141 mmol/L (ref 135–145)
Total Protein: 7.3 g/dL (ref 6.5–8.1)

## 2018-01-30 LAB — CBC WITH DIFFERENTIAL/PLATELET
Abs Immature Granulocytes: 0.1 10*3/uL — ABNORMAL HIGH (ref 0.00–0.07)
Basophils Absolute: 0.1 10*3/uL (ref 0.0–0.1)
Basophils Relative: 0 %
Eosinophils Absolute: 0.1 10*3/uL (ref 0.0–0.5)
Eosinophils Relative: 0 %
HEMATOCRIT: 47.5 % (ref 39.0–52.0)
Hemoglobin: 15.7 g/dL (ref 13.0–17.0)
Immature Granulocytes: 1 %
LYMPHS ABS: 1.2 10*3/uL (ref 0.7–4.0)
Lymphocytes Relative: 7 %
MCH: 31.1 pg (ref 26.0–34.0)
MCHC: 33.1 g/dL (ref 30.0–36.0)
MCV: 94.1 fL (ref 80.0–100.0)
Monocytes Absolute: 0.8 10*3/uL (ref 0.1–1.0)
Monocytes Relative: 5 %
Neutro Abs: 14.5 10*3/uL — ABNORMAL HIGH (ref 1.7–7.7)
Neutrophils Relative %: 87 %
Platelets: 195 10*3/uL (ref 150–400)
RBC: 5.05 MIL/uL (ref 4.22–5.81)
RDW: 12.4 % (ref 11.5–15.5)
WBC: 16.7 10*3/uL — ABNORMAL HIGH (ref 4.0–10.5)
nRBC: 0 % (ref 0.0–0.2)

## 2018-01-30 MED ORDER — HYDROMORPHONE HCL 1 MG/ML IJ SOLN
0.5000 mg | Freq: Once | INTRAMUSCULAR | Status: AC
  Administered 2018-01-31: 0.5 mg via INTRAVENOUS
  Filled 2018-01-30: qty 1

## 2018-01-30 MED ORDER — HYDROMORPHONE HCL 1 MG/ML IJ SOLN
1.0000 mg | Freq: Once | INTRAMUSCULAR | Status: AC
  Administered 2018-01-30: 1 mg via INTRAVENOUS
  Filled 2018-01-30: qty 1

## 2018-01-30 MED ORDER — ONDANSETRON HCL 4 MG/2ML IJ SOLN
4.0000 mg | Freq: Once | INTRAMUSCULAR | Status: AC
  Administered 2018-01-30: 4 mg via INTRAVENOUS
  Filled 2018-01-30: qty 2

## 2018-01-30 NOTE — ED Provider Notes (Signed)
[facility] DEPT Provider Note   CSN: [id number] Arrival date & time: 01/30/18  2118     History   Chief Complaint Chief Complaint  Patient presents with  . Abdominal Pain    HPI [name] is a 62 y.o. male.  Patient with a history of MS presents with sudden onset pain in the RLQ abdomen that started around 8:30 this evening. It is described as constant, waxing and waning, causing nausea with the worst pain. No fever. Last BM was this morning. The pain does not radiate. No urinary symptoms. No SOB or chest pain. He reports that he noticed bumps in the road making the pain worse on the drive to the ED. History of cholecystectomy and hernia repair (bilateral inguinal, ventral) in the past. No bulging to abdominal wall noted.   The history is provided by the patient and the spouse. No language interpreter was used.  Abdominal Pain   Associated symptoms include nausea. Pertinent negatives include fever, diarrhea, vomiting, dysuria and hematuria.    Past Medical History:  Diagnosis Date  . Arthritis   . Gallstone pancreatitis   . Hernia   . History of inguinal hernia repair   . Low back pain   . MS (multiple sclerosis) ([place])   . Pancreatitis hosp[italized with gallstone pancreatitis 3/12[  . Vitamin D deficiency     Patient Active Problem List   Diagnosis Date Noted  . Influenza A   . Viral gastroenteritis 03/20/2016  . Nausea & vomiting 03/20/2016  . Asthma 03/20/2016  . Leukocytosis 03/20/2016  . Intractable nausea and vomiting 03/20/2016  . GERD (gastroesophageal reflux disease) 09/25/2014  . Wrist pain, right 09/11/2011  . Left inguinal hernia 02/16/2011  . Paresthesia 01/24/2011  . Well adult exam 07/20/2010  . GALLSTONE PANCREATITIS 04/15/2010  . FREQUENCY, URINARY 04/15/2010  . Abnormality of gait 04/05/2010  . Headache 04/05/2010  . INSOMNIA, CHRONIC 10/15/2009  . Vitamin D deficiency 06/14/2009  . SEBORRHEIC DERMATITIS  06/14/2009  . LOW BACK PAIN 06/14/2009  . ASTHMA NOS W/ACUTE EXACERBATION 04/19/2009  . UPPER RESPIRATORY INFECTION, ACUTE 04/13/2009  . Multiple sclerosis ([place]) 05/14/2008  . COUGH 05/14/2008    Past Surgical History:  Procedure Laterality Date  . CHOLECYSTECTOMY  04/2010  . CYSTECTOMY     on back and R hand  . HERNIA REPAIR    . INGUINAL HERNIA REPAIR  03/17/2011   Procedure: HERNIA REPAIR INGUINAL ADULT;  Surgeon: Adin Hector, MD;  Location: [facility];  Service: General;  Laterality: Left;  Open left inguinal hernia repair with mesh  . KNEE ARTHROSCOPY     right  . NOSE SURGERY    . TONSILLECTOMY    . WISDOM TOOTH EXTRACTION          Home Medications    Prior to Admission medications   Medication Sig Start Date End Date Taking? Authorizing Provider  amitriptyline (ELAVIL) 25 MG tablet Take 2 tablets (50 mg total) by mouth at bedtime as needed for sleep. 08/29/17   Plotnikov, Evie Lacks, MD  baclofen (LIORESAL) 20 MG tablet Take 1 tablet (20 mg total) by mouth 4 (four) times daily as needed for muscle spasms. 11/14/17   Plotnikov, Evie Lacks, MD  cetirizine (ZYRTEC) 10 MG tablet Take 10 mg by mouth daily as needed for allergies.     [provider]  cholecalciferol (VITAMIN D) 1000 units tablet Take 2,000 Units by mouth 2 (two) times daily.    [provider]  clotrimazole-betamethasone (LOTRISONE) cream Apply 1 application topically 2 (two) times daily as needed (for irritation). 11/14/17   Plotnikov, Evie Lacks, MD  dalfampridine (AMPYRA) 10 MG TB12 Take 1 tablet (10 mg total) by mouth 2 (two) times daily. 09/04/12   Plotnikov, Evie Lacks, MD  fluticasone (FLONASE) 50 MCG/ACT nasal spray Place 2 sprays into both nostrils daily as needed for rhinitis.     [provider]  gabapentin (NEURONTIN) 100 MG capsule Take 2 capsules (200 mg total) by mouth 3 (three) times daily as needed (for pain). 11/14/17   Plotnikov, Evie Lacks, MD  ketorolac  (TORADOL) 10 MG tablet Take 1 tablet (10 mg total) by mouth daily as needed for severe pain. 09/15/17   Plotnikov, Evie Lacks, MD  Multiple Vitamin (MULTIVITAMIN WITH MINERALS) TABS tablet Take 1 tablet by mouth daily.    [provider]  ocrelizumab 600 mg in sodium chloride 0.9 % 500 mL Inject 600 mg into the vein every 6 (six) months.    [provider]  pantoprazole (PROTONIX) 40 MG tablet Take 1 tablet (40 mg total) by mouth daily. 11/14/17   Plotnikov, Evie Lacks, MD  TraMADol HCl 200 MG CP24 Take 1 capsule by mouth daily. 11/07/17   Plotnikov, Evie Lacks, MD    Family History Family History  Problem Relation Age of Onset  . Cancer Father        ?  . Diabetes Mother     Social History Social History   Tobacco Use  . Smoking status: Never Smoker  . Smokeless tobacco: Never Used  Substance Use Topics  . Alcohol use: No  . Drug use: No     Allergies   Meperidine and Prednisone   Review of Systems Review of Systems  Constitutional: Negative for chills and fever.  HENT: Negative.   Respiratory: Negative.  Negative for shortness of breath.   Cardiovascular: Negative.  Negative for chest pain.  Gastrointestinal: Positive for abdominal pain and nausea. Negative for blood in stool, diarrhea and vomiting.  Genitourinary: Negative.  Negative for dysuria, flank pain, hematuria and testicular pain.  Musculoskeletal: Negative.   Skin: Negative.   Neurological: Negative.      Physical Exam Updated Vital Signs BP (!) 154/96 (BP Location: Left Arm)   Pulse 60   Temp 97.6 F (36.4 C) (Oral)   Resp 18   SpO2 97%   Physical Exam Vitals signs and nursing note reviewed.  Constitutional:      Appearance: He is well-developed.  HENT:     Head: Normocephalic.  Neck:     Musculoskeletal: Normal range of motion and neck supple.  Cardiovascular:     Rate and Rhythm: Normal rate and regular rhythm.  Pulmonary:     Effort: Pulmonary effort is normal.     Breath  sounds: Normal breath sounds. No wheezing or rhonchi.  Abdominal:     General: Abdomen is protuberant. Bowel sounds are decreased.     Palpations: Abdomen is soft.     Tenderness: There is abdominal tenderness in the right lower quadrant. There is guarding and rebound. Positive signs include McBurney's sign and obturator sign.  Musculoskeletal: Normal range of motion.  Skin:    General: Skin is warm and dry.     Findings: No rash.  Neurological:     General: No focal deficit present.     Mental Status: He is alert and oriented to person, place, and time.      ED Treatments /  Results  Labs (all labs ordered are listed, but only abnormal results are displayed) Labs Reviewed  CBC WITH DIFFERENTIAL/PLATELET  COMPREHENSIVE METABOLIC PANEL  URINALYSIS, ROUTINE W REFLEX MICROSCOPIC    EKG None  Radiology No results found.  Procedures Procedures (including critical care time)  Medications Ordered in ED Medications  HYDROmorphone (DILAUDID) injection 1 mg (has no administration in time range)  ondansetron (ZOFRAN) injection 4 mg (has no administration in time range)     Initial Impression / Assessment and Plan / ED Course  I have reviewed the triage vital signs and the nursing notes.  Pertinent labs & imaging results that were available during my care of the patient were reviewed by me and considered in my medical decision making (see chart for details).     Patient to ED with sudden onset RLQ pain, constant since onset, nausea with the worst pain. He has RLQ tenderness on exam and will need evaluation with labs and imaging.   Pain is difficult to control. He is given a total of 1.5 mg Dilaudid with Zofran and is resting comfortably on recheck.   CT scan shows a renal stone within the bladder and inflammation of ureter. UA without infection. There is hemoglobin in the urine supporting the dx of Kidney stone.   He can go home to prn follow up with urology if pain  continues. Strict return precautions are discussed. Rx's Percocet, Zofran are given.   Final Clinical Impressions(s) / ED Diagnoses   Final diagnoses:  None   1. Kidney stone  ED Discharge Orders    None       Dennie Bible 92/[id number]/62 [id number]    Delora Fuel, MD 17/71/16 2236

## 2018-01-30 NOTE — ED Triage Notes (Signed)
Pt reports sudden onset of RLQ abd pain at about 2020 tonight pt reports gallbladder removed 2014

## 2018-01-30 NOTE — ED Notes (Signed)
Pt. Made aware for the need of urine specimen. 

## 2018-01-31 ENCOUNTER — Emergency Department (HOSPITAL_COMMUNITY): Payer: Medicare Other

## 2018-01-31 ENCOUNTER — Encounter (HOSPITAL_COMMUNITY): Payer: Self-pay

## 2018-01-31 DIAGNOSIS — N200 Calculus of kidney: Secondary | ICD-10-CM | POA: Diagnosis not present

## 2018-01-31 DIAGNOSIS — N281 Cyst of kidney, acquired: Secondary | ICD-10-CM | POA: Diagnosis not present

## 2018-01-31 DIAGNOSIS — N3289 Other specified disorders of bladder: Secondary | ICD-10-CM | POA: Diagnosis not present

## 2018-01-31 LAB — URINALYSIS, ROUTINE W REFLEX MICROSCOPIC
Bacteria, UA: NONE SEEN
Bilirubin Urine: NEGATIVE
Glucose, UA: NEGATIVE mg/dL
Ketones, ur: 5 mg/dL — AB
Leukocytes, UA: NEGATIVE
Nitrite: NEGATIVE
Protein, ur: NEGATIVE mg/dL
RBC / HPF: >50 RBC/hpf — ABNORMAL HIGH (ref 0–5)
SPECIFIC GRAVITY, URINE: 1.031 — AB (ref 1.005–1.030)
pH: 7 (ref 5.0–8.0)

## 2018-01-31 MED ORDER — SODIUM CHLORIDE (PF) 0.9 % IJ SOLN
INTRAMUSCULAR | Status: AC
  Filled 2018-01-31: qty 50

## 2018-01-31 MED ORDER — IOPAMIDOL (ISOVUE-300) INJECTION 61%
100.0000 mL | Freq: Once | INTRAVENOUS | Status: AC | PRN
  Administered 2018-01-31: 100 mL via INTRAVENOUS

## 2018-01-31 MED ORDER — OXYCODONE-ACETAMINOPHEN 5-325 MG PO TABS: 1.0000 | ORAL_TABLET | ORAL | 0 refills | Status: DC | PRN

## 2018-01-31 MED ORDER — IBUPROFEN 600 MG PO TABS: 600.0000 mg | ORAL_TABLET | Freq: Four times a day (QID) | ORAL | 0 refills | Status: DC | PRN

## 2018-01-31 MED ORDER — ONDANSETRON 4 MG PO TBDP: 4.0000 mg | ORAL_TABLET | Freq: Three times a day (TID) | ORAL | 0 refills | Status: DC | PRN

## 2018-01-31 MED ORDER — IOPAMIDOL (ISOVUE-300) INJECTION 61%
INTRAVENOUS | Status: AC
  Filled 2018-01-31: qty 100

## 2018-01-31 NOTE — ED Notes (Signed)
Pt's wife came to nursing station stating that "pt is ready to leave when possible." [place], [place] notified

## 2018-01-31 NOTE — ED Notes (Signed)
Patient transported to CT 

## 2018-01-31 NOTE — ED Notes (Signed)
Pt aware that urine sample is needed. Given urinal and will staff when he is able

## 2018-01-31 NOTE — Discharge Instructions (Signed)
Return to the ED with any high fever, severe pain, uncontrolled vomiting or new concern. If pain persists, call [facility] for an appointment for further management.

## 2018-01-31 NOTE — ED Notes (Signed)
Pt and wife verbalized discharge instructions and follow up care. Pt assisted into wheelchair and car. Alert. Wife driving home

## 2018-01-31 NOTE — ED Notes (Signed)
Urine and culture sent to lab  

## 2018-02-01 LAB — URINE CULTURE: Culture: NO GROWTH

## 2018-02-19 DIAGNOSIS — G35 Multiple sclerosis: Secondary | ICD-10-CM | POA: Diagnosis not present

## 2018-02-19 DIAGNOSIS — G8929 Other chronic pain: Secondary | ICD-10-CM | POA: Diagnosis not present

## 2018-02-27 ENCOUNTER — Ambulatory Visit (INDEPENDENT_AMBULATORY_CARE_PROVIDER_SITE_OTHER): Payer: Medicare Other | Admitting: Internal Medicine

## 2018-02-27 ENCOUNTER — Encounter: Payer: Self-pay | Admitting: Internal Medicine

## 2018-02-27 ENCOUNTER — Ambulatory Visit (INDEPENDENT_AMBULATORY_CARE_PROVIDER_SITE_OTHER)
Admission: RE | Admit: 2018-02-27 | Discharge: 2018-02-27 | Disposition: A | Discharge status: Home / Self Care | Payer: Medicare Other | Source: Ambulatory Visit | Attending: Internal Medicine | Admitting: Internal Medicine

## 2018-02-27 DIAGNOSIS — J209 Acute bronchitis, unspecified: Secondary | ICD-10-CM | POA: Diagnosis not present

## 2018-02-27 DIAGNOSIS — R05 Cough: Secondary | ICD-10-CM | POA: Diagnosis not present

## 2018-02-27 MED ORDER — AZITHROMYCIN 250 MG PO TABS: ORAL_TABLET | ORAL | 0 refills | Status: DC

## 2018-02-27 NOTE — Assessment & Plan Note (Signed)
CXR Zpac

## 2018-02-27 NOTE — Progress Notes (Signed)
Subjective:  Patient ID: [name], male    DOB: 05/01/1955  Age: 63 y.o. MRN: [id number]  CC: No chief complaint on file.   HPI [name] presents for URI sx's x 2 weeks C/o cough, brown sputum    Outpatient Medications Prior to Visit  Medication Sig Dispense Refill  . amitriptyline (ELAVIL) 25 MG tablet Take 2 tablets (50 mg total) by mouth at bedtime as needed for sleep. 180 tablet 3  . baclofen (LIORESAL) 20 MG tablet Take 1 tablet (20 mg total) by mouth 4 (four) times daily as needed for muscle spasms. 360 each 3  . cetirizine (ZYRTEC) 10 MG tablet Take 10 mg by mouth daily as needed for allergies.     . cholecalciferol (VITAMIN D) 1000 units tablet Take 2,000 Units by mouth 2 (two) times daily.    . clotrimazole-betamethasone (LOTRISONE) cream Apply 1 application topically 2 (two) times daily as needed (for irritation). 30 g 3  . dalfampridine (AMPYRA) 10 MG TB12 Take 1 tablet (10 mg total) by mouth 2 (two) times daily. 180 tablet 3  . fluticasone (FLONASE) 50 MCG/ACT nasal spray Place 2 sprays into both nostrils daily as needed for rhinitis.     [name] gabapentin (NEURONTIN) 100 MG capsule Take 2 capsules (200 mg total) by mouth 3 (three) times daily as needed (for pain). 360 capsule 3  . ibuprofen (ADVIL,MOTRIN) 600 MG tablet Take 1 tablet (600 mg total) by mouth every 6 (six) hours as needed. 30 tablet 0  . ketorolac (TORADOL) 10 MG tablet Take 1 tablet (10 mg total) by mouth daily as needed for severe pain. 30 tablet 2  . Multiple Vitamin (MULTIVITAMIN WITH MINERALS) TABS tablet Take 1 tablet by mouth daily.    . ondansetron (ZOFRAN ODT) 4 MG disintegrating tablet Take 1 tablet (4 mg total) by mouth every 8 (eight) hours as needed for nausea or vomiting. 20 tablet 0  . oxyCODONE-acetaminophen (PERCOCET/ROXICET) 5-325 MG tablet Take 1-2 tablets by mouth every 4 (four) hours as needed for severe pain. 8 tablet 0  . pantoprazole (PROTONIX) 40 MG tablet Take 1 tablet (40 mg  total) by mouth daily. 90 tablet 3  . TraMADol HCl 200 MG CP24 Take 1 capsule by mouth daily. 90 capsule 1   No facility-administered medications prior to visit.     ROS: Review of Systems  Constitutional: Positive for chills and fatigue. Negative for appetite change and unexpected weight change.  HENT: Positive for congestion. Negative for nosebleeds, sneezing, sore throat and trouble swallowing.   Eyes: Negative for itching and visual disturbance.  Respiratory: Positive for cough.   Cardiovascular: Negative for chest pain, palpitations and leg swelling.  Gastrointestinal: Negative for abdominal distention, blood in stool, diarrhea and nausea.  Genitourinary: Negative for frequency and hematuria.  Musculoskeletal: Positive for arthralgias and gait problem. Negative for back pain, joint swelling and neck pain.  Skin: Negative for rash.  Neurological: Positive for weakness. Negative for dizziness, tremors and speech difficulty.  Psychiatric/Behavioral: Negative for agitation, dysphoric mood and sleep disturbance. The patient is not nervous/anxious.     Objective:  BP 128/84 (BP Location: Left Arm, Patient Position: Sitting, Cuff Size: Normal)   Pulse 86   Temp 98.3 F (36.8 C) (Oral)   Ht 5\' 7"  (1.702 m)   Wt 172 lb (78 kg)   SpO2 96%   BMI 26.94 kg/m   BP Readings from Last 3 Encounters:  02/27/18 128/84  01/31/18 117/79  11/14/17 126/84  Wt Readings from Last 3 Encounters:  02/27/18 172 lb (78 kg)  11/14/17 164 lb (74.4 kg)  05/16/17 164 lb (74.4 kg)    Physical Exam Constitutional:      General: He is not in acute distress.    Appearance: He is well-developed.     Comments: NAD  Eyes:     Conjunctiva/sclera: Conjunctivae normal.     Pupils: Pupils are equal, round, and reactive to light.  Neck:     Musculoskeletal: Normal range of motion.     Thyroid: No thyromegaly.     Vascular: No JVD.  Cardiovascular:     Rate and Rhythm: Normal rate and regular  rhythm.     Heart sounds: Normal heart sounds. No murmur. No friction rub. No gallop.   Pulmonary:     Effort: Pulmonary effort is normal. No respiratory distress.     Breath sounds: Normal breath sounds. No wheezing or rales.  Chest:     Chest wall: No tenderness.  Abdominal:     General: Bowel sounds are normal. There is no distension.     Palpations: Abdomen is soft. There is no mass.     Tenderness: There is no abdominal tenderness. There is no guarding or rebound.  Musculoskeletal: Normal range of motion.        General: No tenderness.  Lymphadenopathy:     Cervical: No cervical adenopathy.  Skin:    General: Skin is warm and dry.     Findings: No rash.  Neurological:     Mental Status: He is alert and oriented to person, place, and time.     Cranial Nerves: No cranial nerve deficit.     Motor: No abnormal muscle tone.     Coordination: Coordination normal.     Gait: Gait normal.     Deep Tendon Reflexes: Reflexes are normal and symmetric.  Psychiatric:        Behavior: Behavior normal.        Thought Content: Thought content normal.        Judgment: Judgment normal.   ataxic Cane eryth throat  Lab Results  Component Value Date   WBC 16.7 (H) 01/30/2018   HGB 15.7 01/30/2018   HCT 47.5 01/30/2018   PLT 195 01/30/2018   GLUCOSE 138 (H) 01/30/2018   CHOL 182 11/14/2017   TRIG 107.0 11/14/2017   HDL 48.50 11/14/2017   LDLDIRECT 118.4 06/09/2009   LDLCALC 112 (H) 11/14/2017   ALT 22 01/30/2018   AST 18 01/30/2018   NA 141 01/30/2018   K 4.1 01/30/2018   CL 103 01/30/2018   CREATININE 1.08 01/30/2018   BUN 16 01/30/2018   CO2 28 01/30/2018   TSH 3.75 11/14/2017   PSA 2.75 11/14/2017   INR 1.0 08/24/2008    Ct Abdomen Pelvis W Contrast  Result Date: 01/31/2018 CLINICAL DATA:  Acute onset of right lower quadrant abdominal pain and leukocytosis. EXAM: CT ABDOMEN AND PELVIS WITH CONTRAST TECHNIQUE: Multidetector CT imaging of the abdomen and pelvis was  performed using the standard protocol following bolus administration of intravenous contrast. CONTRAST:  148mL ISOVUE-300 IOPAMIDOL (ISOVUE-300) INJECTION 61% COMPARISON:  MRCP performed 04/22/2010 FINDINGS: Lower chest: Mild bibasilar atelectasis is noted. A few calcified granulomata are noted at the medial left lung base. The visualized portions of the mediastinum are unremarkable. Hepatobiliary: The liver is unremarkable in appearance. The patient is status post cholecystectomy, with clips noted at the gallbladder fossa. The common bile duct remains normal in caliber. Pancreas:  The pancreas is within normal limits. Spleen: The spleen is unremarkable in appearance. Adrenals/Urinary Tract: The adrenal glands are unremarkable in appearance. Bilateral perinephric stranding is noted, more prominent on the right. There is mild ureteral wall enhancement along the right ureter. This is concerning for right-sided ureteritis. An underlying 2 mm stone is noted at the base of the bladder, likely reflecting a recently passed stone. No significant hydronephrosis is now seen. Right renal cysts are noted. The left kidney is unremarkable in appearance. No nonobstructing renal stones are identified. Stomach/Bowel: The stomach is unremarkable in appearance. The small bowel is within normal limits. The appendix is not visualized; there is no evidence for appendicitis. The colon is unremarkable in appearance. Vascular/Lymphatic: The abdominal aorta is unremarkable in appearance. The inferior vena cava is grossly unremarkable. No retroperitoneal lymphadenopathy is seen. No pelvic sidewall lymphadenopathy is identified. Reproductive: The bladder is mildly distended and otherwise unremarkable. The prostate is mildly enlarged, measuring 5.3 cm in transverse dimension. Other: A small left inguinal hernia is noted, containing only fat. Musculoskeletal: No acute osseous abnormalities are identified. The visualized musculature is  unremarkable in appearance. IMPRESSION: 1. Mild ureteral wall enhancement along the right ureter. This is concerning for right-sided ureteritis. Underlying 2 mm stone noted at the base of the bladder, likely reflecting a recently passed stone. No significant hydronephrosis now seen. 2. Right renal cysts noted. 3. Mildly enlarged prostate. 4. Small left inguinal hernia, containing only fat. Electronically Signed   By: Garald Balding M.D.   On: 01/31/2018 00:35    Assessment & Plan:   There are no diagnoses linked to this encounter.   No orders of the defined types were placed in this encounter.    Follow-up: No follow-ups on file.  Walker Kehr, MD

## 2018-02-27 NOTE — Patient Instructions (Signed)
You can use over-the-counter  "cold" medicines  such as  "Afrin" nasal spray for nasal congestion as directed. Use " Delsym" or" Robitussin" cough syrup varietis for cough.  You can use plain "Tylenol" or "Advil" for fever, chills and achyness. Use Halls or Ricola cough drops.     Please, make an appointment if you are not better or if you're worse.  

## 2018-03-01 DIAGNOSIS — L111 Transient acantholytic dermatosis [Grover]: Secondary | ICD-10-CM | POA: Diagnosis not present

## 2018-03-01 DIAGNOSIS — D1801 Hemangioma of skin and subcutaneous tissue: Secondary | ICD-10-CM | POA: Diagnosis not present

## 2018-03-01 DIAGNOSIS — Z85828 Personal history of other malignant neoplasm of skin: Secondary | ICD-10-CM | POA: Diagnosis not present

## 2018-03-01 DIAGNOSIS — C44619 Basal cell carcinoma of skin of left upper limb, including shoulder: Secondary | ICD-10-CM | POA: Diagnosis not present

## 2018-03-01 DIAGNOSIS — L218 Other seborrheic dermatitis: Secondary | ICD-10-CM | POA: Diagnosis not present

## 2018-03-01 DIAGNOSIS — L821 Other seborrheic keratosis: Secondary | ICD-10-CM | POA: Diagnosis not present

## 2018-03-01 DIAGNOSIS — D225 Melanocytic nevi of trunk: Secondary | ICD-10-CM | POA: Diagnosis not present

## 2018-03-04 DIAGNOSIS — C44619 Basal cell carcinoma of skin of left upper limb, including shoulder: Secondary | ICD-10-CM | POA: Diagnosis not present

## 2018-05-20 ENCOUNTER — Encounter: Payer: Self-pay | Admitting: Internal Medicine

## 2018-05-20 ENCOUNTER — Ambulatory Visit (INDEPENDENT_AMBULATORY_CARE_PROVIDER_SITE_OTHER): Payer: Medicare Other | Admitting: Internal Medicine

## 2018-05-20 DIAGNOSIS — G8929 Other chronic pain: Secondary | ICD-10-CM | POA: Diagnosis not present

## 2018-05-20 DIAGNOSIS — K219 Gastro-esophageal reflux disease without esophagitis: Secondary | ICD-10-CM

## 2018-05-20 DIAGNOSIS — J209 Acute bronchitis, unspecified: Secondary | ICD-10-CM | POA: Diagnosis not present

## 2018-05-20 DIAGNOSIS — M5440 Lumbago with sciatica, unspecified side: Secondary | ICD-10-CM

## 2018-05-20 DIAGNOSIS — J4520 Mild intermittent asthma, uncomplicated: Secondary | ICD-10-CM | POA: Diagnosis not present

## 2018-05-20 DIAGNOSIS — J4521 Mild intermittent asthma with (acute) exacerbation: Secondary | ICD-10-CM | POA: Diagnosis not present

## 2018-05-20 MED ORDER — AZITHROMYCIN 250 MG PO TABS: ORAL_TABLET | ORAL | 0 refills | Status: DC

## 2018-05-20 NOTE — Assessment & Plan Note (Signed)
Z-Pak

## 2018-05-20 NOTE — Assessment & Plan Note (Signed)
[name] states that the inhalers never helped him in the past.  The same was true for prednisone.  We will continue to treat allergies.  Repeat Z-Pak

## 2018-05-20 NOTE — Assessment & Plan Note (Signed)
Continue Protonix °

## 2018-05-20 NOTE — Assessment & Plan Note (Signed)
Recurrent, seasonal -winter Able states that the inhalers never helped him in the past.  The same was true for prednisone.  We will continue to treat allergies.  Repeat Z-Pak

## 2018-05-20 NOTE — Progress Notes (Signed)
Virtual Visit via Telephone Note  I connected with [name] on 05/20/18 at  1:20 PM EDT by telephone and verified that I am speaking with the correct person using two identifiers.   I discussed the limitations, risks, security and privacy concerns of performing an evaluation and management service by telephone and the availability of in person appointments. I also discussed with the patient that there may be a patient responsible charge related to this service. The patient expressed understanding and agreed to proceed.   History of Present Illness:   [name] is complaining of cough 2-3 months duration.  Z-Pak that we gave him in the beginning help.  His chest x-ray was normal in January 2020.  He noted that this problem has been a recurrent seasonally.  He is on Zyrtec and Flonase.  There is no chest pain, shortness of breath, he is planning to do a mold test in the house. No wheezing.  There is no mucus production. He is taking Protonix for his GERD.  Follow-up for chronic pain Observations/Objective:  [name] is in no acute distress.  There is normal nasal tone to his voice Assessment and Plan: See plan COVID-19 prevention discussed  Follow Up Instructions:    I discussed the assessment and treatment plan with the patient. The patient was provided an opportunity to ask questions and all were answered. The patient agreed with the plan and demonstrated an understanding of the instructions.   The patient was advised to call back or seek an in-person evaluation if the symptoms worsen or if the condition fails to improve as anticipated.  I provided 20 minutes of non-face-to-face time during this encounter.   Walker Kehr, MD

## 2018-05-20 NOTE — Assessment & Plan Note (Signed)
Tramadol as needed

## 2018-07-22 NOTE — Telephone Encounter (Signed)
Med not on med list pls advise../lmb 

## 2018-07-23 ENCOUNTER — Other Ambulatory Visit: Payer: Self-pay | Admitting: Internal Medicine

## 2018-07-23 MED ORDER — TRAMADOL HCL (ER BIPHASIC) 200 MG PO CP24: 1.0000 | ORAL_CAPSULE | Freq: Every day | ORAL | 1 refills | Status: DC

## 2018-09-10 ENCOUNTER — Other Ambulatory Visit: Payer: Self-pay | Admitting: Internal Medicine

## 2018-09-23 DIAGNOSIS — G35 Multiple sclerosis: Secondary | ICD-10-CM | POA: Diagnosis not present

## 2018-09-24 DIAGNOSIS — G35 Multiple sclerosis: Secondary | ICD-10-CM | POA: Diagnosis not present

## 2018-10-08 DIAGNOSIS — G35 Multiple sclerosis: Secondary | ICD-10-CM | POA: Diagnosis not present

## 2018-11-04 DIAGNOSIS — H0102A Squamous blepharitis right eye, upper and lower eyelids: Secondary | ICD-10-CM | POA: Diagnosis not present

## 2018-11-04 DIAGNOSIS — H2513 Age-related nuclear cataract, bilateral: Secondary | ICD-10-CM | POA: Diagnosis not present

## 2018-11-04 DIAGNOSIS — H0102B Squamous blepharitis left eye, upper and lower eyelids: Secondary | ICD-10-CM | POA: Diagnosis not present

## 2018-11-04 DIAGNOSIS — G35 Multiple sclerosis: Secondary | ICD-10-CM | POA: Diagnosis not present

## 2018-11-04 DIAGNOSIS — H4720 Unspecified optic atrophy: Secondary | ICD-10-CM | POA: Diagnosis not present

## 2018-11-16 ENCOUNTER — Ambulatory Visit (INDEPENDENT_AMBULATORY_CARE_PROVIDER_SITE_OTHER): Payer: Medicare Other

## 2018-11-16 ENCOUNTER — Other Ambulatory Visit: Payer: Self-pay

## 2018-11-16 DIAGNOSIS — Z23 Encounter for immunization: Secondary | ICD-10-CM | POA: Diagnosis not present

## 2018-11-25 ENCOUNTER — Other Ambulatory Visit: Payer: Self-pay | Admitting: Internal Medicine

## 2018-11-28 ENCOUNTER — Encounter: Payer: Self-pay | Admitting: Internal Medicine

## 2018-11-28 ENCOUNTER — Ambulatory Visit (INDEPENDENT_AMBULATORY_CARE_PROVIDER_SITE_OTHER): Payer: Medicare Other | Admitting: Internal Medicine

## 2018-11-28 DIAGNOSIS — J069 Acute upper respiratory infection, unspecified: Secondary | ICD-10-CM | POA: Diagnosis not present

## 2018-11-28 DIAGNOSIS — J4520 Mild intermittent asthma, uncomplicated: Secondary | ICD-10-CM

## 2018-11-28 DIAGNOSIS — G35 Multiple sclerosis: Secondary | ICD-10-CM

## 2018-11-28 MED ORDER — HYDROCODONE-HOMATROPINE 5-1.5 MG/5ML PO SYRP: 5.0000 mL | ORAL_SOLUTION | Freq: Four times a day (QID) | ORAL | 0 refills | Status: DC | PRN

## 2018-11-28 MED ORDER — AZITHROMYCIN 250 MG PO TABS: ORAL_TABLET | ORAL | 1 refills | Status: DC

## 2018-11-28 NOTE — Assessment & Plan Note (Signed)
Mild to mod, for antibx course,  Cough med prn,to f/u any worsening symptoms or concerns 

## 2018-11-28 NOTE — Patient Instructions (Signed)
Please take all new medication as prescribed  Please continue all other medications as before, and refills have been done if requested.  Please have the pharmacy call with any other refills you may need.  Please continue your efforts at being more active, low cholesterol diet, and weight control.  Please keep your appointments with your specialists as you may have planned    

## 2018-11-28 NOTE — Progress Notes (Signed)
Patient ID: [name], male   DOB: 05/08/1955, 63 y.o.   MRN: [id number]  Virtual Visit via Video Note  I connected with [name] on 11/28/18 at 11:00 AM EDT by a video enabled telemedicine application and verified that I am speaking with the correct person using two identifiers.  Location: Patient: at home Provider: at office   I discussed the limitations of evaluation and management by telemedicine and the availability of in person appointments. The patient expressed understanding and agreed to proceed.  History of Present Illness:  Here with 2-3 days acute onset fever, facial pain, pressure, headache, general weakness and malaise, and greenish d/c, with mild ST and cough, but pt denies chest pain, wheezing, increased sob or doe, orthopnea, PND, increased LE swelling, palpitations, dizziness or syncope.  Pt denies new neurological symptoms such as new headache, or facial or extremity weakness or numbness   Pt denies polydipsia, polyuria Past Medical History:  Diagnosis Date  . Arthritis   . Gallstone pancreatitis   . Hernia   . History of inguinal hernia repair   . Low back pain   . MS (multiple sclerosis) ([place])   . Pancreatitis hosp[italized with gallstone pancreatitis 3/12[  . Vitamin D deficiency    Past Surgical History:  Procedure Laterality Date  . CHOLECYSTECTOMY  04/2010  . CYSTECTOMY     on back and R hand  . HERNIA REPAIR    . INGUINAL HERNIA REPAIR  03/17/2011   Procedure: HERNIA REPAIR INGUINAL ADULT;  Surgeon: Adin Hector, MD;  Location: [facility];  Service: General;  Laterality: Left;  Open left inguinal hernia repair with mesh  . KNEE ARTHROSCOPY     right  . NOSE SURGERY    . TONSILLECTOMY    . WISDOM TOOTH EXTRACTION      reports that he has never smoked. He has never used smokeless tobacco. He reports that he does not drink alcohol or use drugs. family history includes Cancer in his father; Diabetes in his mother. Allergies   Allergen Reactions  . Meperidine Other (See Comments)    Reaction:  Unknown   . Prednisone Palpitations   Current Outpatient Medications on File Prior to Visit  Medication Sig Dispense Refill  . amitriptyline (ELAVIL) 25 MG tablet Take 2 tablets (50 mg total) by mouth at bedtime as needed for sleep. Office visit needed before refills will be given 180 tablet 0  . azithromycin (ZITHROMAX Z-PAK) 250 MG tablet As directed 6 tablet 0  . baclofen (LIORESAL) 20 MG tablet Take 1 tablet (20 mg total) by mouth 4 (four) times daily as needed for muscle spasms. Office visit needed before refills will be given 360 tablet 0  . cetirizine (ZYRTEC) 10 MG tablet Take 10 mg by mouth daily as needed for allergies.     . cholecalciferol (VITAMIN D) 1000 units tablet Take 2,000 Units by mouth 2 (two) times daily.    . clotrimazole-betamethasone (LOTRISONE) cream Apply 1 application topically 2 (two) times daily as needed (for irritation). 30 g 3  . dalfampridine (AMPYRA) 10 MG TB12 Take 1 tablet (10 mg total) by mouth 2 (two) times daily. 180 tablet 3  . fluticasone (FLONASE) 50 MCG/ACT nasal spray Place 2 sprays into both nostrils daily as needed for rhinitis.     [name] gabapentin (NEURONTIN) 100 MG capsule Take 2 capsules (200 mg total) by mouth 3 (three) times daily as needed (for pain). 360 capsule 3  . ibuprofen (ADVIL,MOTRIN) 600 MG  tablet Take 1 tablet (600 mg total) by mouth every 6 (six) hours as needed. 30 tablet 0  . ketorolac (TORADOL) 10 MG tablet Take 1 tablet (10 mg total) by mouth daily as needed for severe pain. 30 tablet 2  . Multiple Vitamin (MULTIVITAMIN WITH MINERALS) TABS tablet Take 1 tablet by mouth daily.    . ondansetron (ZOFRAN ODT) 4 MG disintegrating tablet Take 1 tablet (4 mg total) by mouth every 8 (eight) hours as needed for nausea or vomiting. 20 tablet 0  . pantoprazole (PROTONIX) 40 MG tablet Take 1 tablet (40 mg total) by mouth daily. 90 tablet 3  . TraMADol HCl 200 MG CP24 Take 1  capsule by mouth daily. 90 capsule 1   No current facility-administered medications on file prior to visit.     Observations/Objective: Alert, NAD, appropriate mood and affect, resps normal, cn 2-12 intact, moves all 4s, no visible rash or swelling Lab Results  Component Value Date   WBC 16.7 (H) 01/30/2018   HGB 15.7 01/30/2018   HCT 47.5 01/30/2018   PLT 195 01/30/2018   GLUCOSE 138 (H) 01/30/2018   CHOL 182 11/14/2017   TRIG 107.0 11/14/2017   HDL 48.50 11/14/2017   LDLDIRECT 118.4 06/09/2009   LDLCALC 112 (H) 11/14/2017   ALT 22 01/30/2018   AST 18 01/30/2018   NA 141 01/30/2018   K 4.1 01/30/2018   CL 103 01/30/2018   CREATININE 1.08 01/30/2018   BUN 16 01/30/2018   CO2 28 01/30/2018   TSH 3.75 11/14/2017   PSA 2.75 11/14/2017   INR 1.0 08/24/2008   Assessment and Plan: See notes  Follow Up Instructions: See notes   I discussed the assessment and treatment plan with the patient. The patient was provided an opportunity to ask questions and all were answered. The patient agreed with the plan and demonstrated an understanding of the instructions.   The patient was advised to call back or seek an in-person evaluation if the symptoms worsen or if the condition fails to improve as anticipated.  Cathlean Cower, MD

## 2018-11-28 NOTE — Assessment & Plan Note (Signed)
stable overall by history and exam, recent data reviewed with pt, and pt to continue medical treatment as before,  to f/u any worsening symptoms or concerns  

## 2018-11-28 NOTE — Assessment & Plan Note (Signed)
enocuraged neuro f/u

## 2018-12-03 ENCOUNTER — Other Ambulatory Visit: Payer: Self-pay

## 2018-12-03 ENCOUNTER — Encounter: Payer: Self-pay | Admitting: Internal Medicine

## 2018-12-03 ENCOUNTER — Ambulatory Visit (INDEPENDENT_AMBULATORY_CARE_PROVIDER_SITE_OTHER): Payer: Medicare Other | Admitting: Internal Medicine

## 2018-12-03 DIAGNOSIS — J4520 Mild intermittent asthma, uncomplicated: Secondary | ICD-10-CM

## 2018-12-03 DIAGNOSIS — G8929 Other chronic pain: Secondary | ICD-10-CM | POA: Diagnosis not present

## 2018-12-03 DIAGNOSIS — E559 Vitamin D deficiency, unspecified: Secondary | ICD-10-CM

## 2018-12-03 DIAGNOSIS — J209 Acute bronchitis, unspecified: Secondary | ICD-10-CM

## 2018-12-03 DIAGNOSIS — M5440 Lumbago with sciatica, unspecified side: Secondary | ICD-10-CM | POA: Diagnosis not present

## 2018-12-03 MED ORDER — BENZONATATE 200 MG PO CAPS: 200.0000 mg | ORAL_CAPSULE | Freq: Three times a day (TID) | ORAL | 1 refills | Status: DC | PRN

## 2018-12-03 NOTE — Assessment & Plan Note (Signed)
Denies issues

## 2018-12-03 NOTE — Assessment & Plan Note (Signed)
Vit D 

## 2018-12-03 NOTE — Assessment & Plan Note (Signed)
Finished Zpac Tessalon prn

## 2018-12-03 NOTE — Progress Notes (Signed)
Subjective:  Patient ID: [name], male    DOB: [birth year]  Age: 63 y.o. MRN: [id number]  CC: No chief complaint on file.   HPI [name] presents for cough, recent URI, MS/pain f/u  Outpatient Medications Prior to Visit  Medication Sig Dispense Refill  . amitriptyline (ELAVIL) 25 MG tablet Take 2 tablets (50 mg total) by mouth at bedtime as needed for sleep. Office visit needed before refills will be given 180 tablet 0  . baclofen (LIORESAL) 20 MG tablet Take 1 tablet (20 mg total) by mouth 4 (four) times daily as needed for muscle spasms. Office visit needed before refills will be given 360 tablet 0  . cetirizine (ZYRTEC) 10 MG tablet Take 10 mg by mouth daily as needed for allergies.     . cholecalciferol (VITAMIN D) 1000 units tablet Take 2,000 Units by mouth 2 (two) times daily.    . clotrimazole-betamethasone (LOTRISONE) cream Apply 1 application topically 2 (two) times daily as needed (for irritation). 30 g 3  . dalfampridine (AMPYRA) 10 MG TB12 Take 1 tablet (10 mg total) by mouth 2 (two) times daily. 180 tablet 3  . fluticasone (FLONASE) 50 MCG/ACT nasal spray Place 2 sprays into both nostrils daily as needed for rhinitis.     [name] gabapentin (NEURONTIN) 100 MG capsule Take 2 capsules (200 mg total) by mouth 3 (three) times daily as needed (for pain). 360 capsule 3  . HYDROcodone-homatropine (HYCODAN) 5-1.5 MG/5ML syrup Take 5 mLs by mouth every 6 (six) hours as needed for up to 10 days for cough. 180 mL 0  . ibuprofen (ADVIL,MOTRIN) 600 MG tablet Take 1 tablet (600 mg total) by mouth every 6 (six) hours as needed. 30 tablet 0  . ketorolac (TORADOL) 10 MG tablet Take 1 tablet (10 mg total) by mouth daily as needed for severe pain. 30 tablet 2  . Multiple Vitamin (MULTIVITAMIN WITH MINERALS) TABS tablet Take 1 tablet by mouth daily.    . ondansetron (ZOFRAN ODT) 4 MG disintegrating tablet Take 1 tablet (4 mg total) by mouth every 8 (eight) hours as needed for nausea or  vomiting. 20 tablet 0  . pantoprazole (PROTONIX) 40 MG tablet Take 1 tablet (40 mg total) by mouth daily. 90 tablet 3  . TraMADol HCl 200 MG CP24 Take 1 capsule by mouth daily. 90 capsule 1  . azithromycin (ZITHROMAX Z-PAK) 250 MG tablet As directed 6 tablet 0  . azithromycin (ZITHROMAX Z-PAK) 250 MG tablet 2 by mouth day 1, then 1 per day 6 tablet 1   No facility-administered medications prior to visit.     ROS: Review of Systems  Constitutional: Negative for appetite change, fatigue and unexpected weight change.  HENT: Negative for congestion, nosebleeds, sneezing, sore throat and trouble swallowing.   Eyes: Negative for itching and visual disturbance.  Respiratory: Positive for cough.   Cardiovascular: Negative for chest pain, palpitations and leg swelling.  Gastrointestinal: Negative for abdominal distention, blood in stool, diarrhea and nausea.  Genitourinary: Negative for frequency and hematuria.  Musculoskeletal: Positive for arthralgias and gait problem. Negative for back pain, joint swelling and neck pain.  Skin: Negative for rash.  Neurological: Positive for weakness. Negative for dizziness, tremors and speech difficulty.  Psychiatric/Behavioral: Negative for agitation, dysphoric mood, sleep disturbance and suicidal ideas. The patient is not nervous/anxious.     Objective:  BP 128/82 (BP Location: Left Arm, Patient Position: Sitting, Cuff Size: Normal)   Pulse 84   Temp 98.1 F (36.7 C) (  Oral)   Ht 5\' 7"  (1.702 m)   Wt 170 lb (77.1 kg)   SpO2 94%   BMI 26.63 kg/m   BP Readings from Last 3 Encounters:  12/03/18 128/82  02/27/18 128/84  01/31/18 117/79    Wt Readings from Last 3 Encounters:  12/03/18 170 lb (77.1 kg)  02/27/18 172 lb (78 kg)  11/14/17 164 lb (74.4 kg)    Physical Exam Constitutional:      General: He is not in acute distress.    Appearance: He is well-developed.     Comments: NAD  Eyes:     Conjunctiva/sclera: Conjunctivae normal.      Pupils: Pupils are equal, round, and reactive to light.  Neck:     Musculoskeletal: Normal range of motion.     Thyroid: No thyromegaly.     Vascular: No JVD.  Cardiovascular:     Rate and Rhythm: Normal rate and regular rhythm.     Heart sounds: Normal heart sounds. No murmur. No friction rub. No gallop.   Pulmonary:     Effort: Pulmonary effort is normal. No respiratory distress.     Breath sounds: Normal breath sounds. No wheezing or rales.  Chest:     Chest wall: No tenderness.  Abdominal:     General: Bowel sounds are normal. There is no distension.     Palpations: Abdomen is soft. There is no mass.     Tenderness: There is no abdominal tenderness. There is no guarding or rebound.  Musculoskeletal: Normal range of motion.        General: Tenderness present.  Lymphadenopathy:     Cervical: No cervical adenopathy.  Skin:    General: Skin is warm and dry.     Findings: No rash.  Neurological:     Mental Status: He is alert and oriented to person, place, and time.     Cranial Nerves: No cranial nerve deficit.     Motor: Weakness present. No abnormal muscle tone.     Coordination: Coordination normal.     Gait: Gait abnormal.     Deep Tendon Reflexes: Reflexes are normal and symmetric.  Psychiatric:        Behavior: Behavior normal.        Thought Content: Thought content normal.        Judgment: Judgment normal.     Lab Results  Component Value Date   WBC 16.7 (H) 01/30/2018   HGB 15.7 01/30/2018   HCT 47.5 01/30/2018   PLT 195 01/30/2018   GLUCOSE 138 (H) 01/30/2018   CHOL 182 11/14/2017   TRIG 107.0 11/14/2017   HDL 48.50 11/14/2017   LDLDIRECT 118.4 06/09/2009   LDLCALC 112 (H) 11/14/2017   ALT 22 01/30/2018   AST 18 01/30/2018   NA 141 01/30/2018   K 4.1 01/30/2018   CL 103 01/30/2018   CREATININE 1.08 01/30/2018   BUN 16 01/30/2018   CO2 28 01/30/2018   TSH 3.75 11/14/2017   PSA 2.75 11/14/2017   INR 1.0 08/24/2008    Dg Chest 2 View  Result  Date: 02/27/2018 CLINICAL DATA:  Productive cough for 2 weeks.  Acute bronchitis. EXAM: CHEST - 2 VIEW COMPARISON:  04/19/2009 FINDINGS: The heart size and mediastinal contours are within normal limits. Both lungs are clear. The visualized skeletal structures are unremarkable. IMPRESSION: No active cardiopulmonary disease. Electronically Signed   By: Earle Gell M.D.   On: 02/27/2018 16:13    Assessment & Plan:   There are  no diagnoses linked to this encounter.   No orders of the defined types were placed in this encounter.    Follow-up: No follow-ups on file.  Walker Kehr, MD

## 2018-12-03 NOTE — Assessment & Plan Note (Signed)
On Tramadol Potential benefits of a long term Tramadol/opioids use as well as potential risks (i.e. addiction risk, apnea etc) and complications (i.e. Somnolence, constipation and others) were explained to the patient and were aknowledged. On Baclofen

## 2018-12-28 ENCOUNTER — Other Ambulatory Visit: Payer: Self-pay | Admitting: Internal Medicine

## 2018-12-30 NOTE — Telephone Encounter (Signed)
[place] Controlled Database Checked Last filled: 10/03/18 # 90 LOV w/you: 12/03/18 Next appt w/you: 06/03/19

## 2018-12-30 NOTE — Telephone Encounter (Signed)
Refill request has been sent to PCP for approval °

## 2018-12-31 DIAGNOSIS — Z79899 Other long term (current) drug therapy: Secondary | ICD-10-CM | POA: Diagnosis not present

## 2018-12-31 DIAGNOSIS — G35 Multiple sclerosis: Secondary | ICD-10-CM | POA: Diagnosis not present

## 2019-01-13 ENCOUNTER — Other Ambulatory Visit: Payer: Self-pay | Admitting: Internal Medicine

## 2019-01-13 MED ORDER — KETOROLAC TROMETHAMINE 10 MG PO TABS: 10.0000 mg | ORAL_TABLET | Freq: Every day | ORAL | 2 refills | Status: DC | PRN

## 2019-01-17 ENCOUNTER — Other Ambulatory Visit: Payer: Self-pay | Admitting: Internal Medicine

## 2019-01-29 ENCOUNTER — Other Ambulatory Visit: Payer: Self-pay | Admitting: Internal Medicine

## 2019-01-29 MED ORDER — HYDROCODONE-HOMATROPINE 5-1.5 MG/5ML PO SYRP: 5.0000 mL | ORAL_SOLUTION | Freq: Two times a day (BID) | ORAL | 0 refills | Status: DC | PRN

## 2019-02-16 ENCOUNTER — Other Ambulatory Visit: Payer: Self-pay | Admitting: Internal Medicine

## 2019-03-05 DIAGNOSIS — D225 Melanocytic nevi of trunk: Secondary | ICD-10-CM | POA: Diagnosis not present

## 2019-03-05 DIAGNOSIS — L814 Other melanin hyperpigmentation: Secondary | ICD-10-CM | POA: Diagnosis not present

## 2019-03-05 DIAGNOSIS — Z85828 Personal history of other malignant neoplasm of skin: Secondary | ICD-10-CM | POA: Diagnosis not present

## 2019-03-05 DIAGNOSIS — L820 Inflamed seborrheic keratosis: Secondary | ICD-10-CM | POA: Diagnosis not present

## 2019-03-05 DIAGNOSIS — L821 Other seborrheic keratosis: Secondary | ICD-10-CM | POA: Diagnosis not present

## 2019-03-14 ENCOUNTER — Ambulatory Visit (INDEPENDENT_AMBULATORY_CARE_PROVIDER_SITE_OTHER): Payer: Medicare Other | Admitting: Family

## 2019-03-14 ENCOUNTER — Encounter: Payer: Self-pay | Admitting: Family

## 2019-03-14 DIAGNOSIS — A084 Viral intestinal infection, unspecified: Secondary | ICD-10-CM

## 2019-03-14 DIAGNOSIS — R197 Diarrhea, unspecified: Secondary | ICD-10-CM | POA: Diagnosis not present

## 2019-03-14 NOTE — Progress Notes (Signed)
[name] is a 64 y.o. male with the following history as recorded in [employer]:  Patient Active Problem List   Diagnosis Date Noted  . Acute bronchitis 02/27/2018  . Influenza A   . Viral gastroenteritis 03/20/2016  . Nausea & vomiting 03/20/2016  . Asthma 03/20/2016  . Leukocytosis 03/20/2016  . Intractable nausea and vomiting 03/20/2016  . GERD (gastroesophageal reflux disease) 09/25/2014  . Wrist pain, right 09/11/2011  . Left inguinal hernia 02/16/2011  . Paresthesia 01/24/2011  . Well adult exam 07/20/2010  . GALLSTONE PANCREATITIS 04/15/2010  . FREQUENCY, URINARY 04/15/2010  . Abnormality of gait 04/05/2010  . Headache 04/05/2010  . INSOMNIA, CHRONIC 10/15/2009  . Vitamin D deficiency 06/14/2009  . SEBORRHEIC DERMATITIS 06/14/2009  . LOW BACK PAIN 06/14/2009  . Asthma exacerbation 04/19/2009  . Acute upper respiratory infection 04/13/2009  . Multiple sclerosis ([place]) 05/14/2008  . COUGH 05/14/2008    Current Outpatient Medications  Medication Sig Dispense Refill  . amitriptyline (ELAVIL) 25 MG tablet TAKE 2 TABLETS AT BEDTIME AS NEEDED FOR SLEEP (OFFICE VISIT NEEDED BD REFILLS WILL BE GIVEN) 180 tablet 0  . baclofen (LIORESAL) 20 MG tablet TAKE 1 TABLET FOUR TIMES A DAY AS NEEDED FOR MUSCLE SPASMS (OFFICE VISIT NEEDED BEFORE REFILLS WILL BE GIVEN) 360 tablet 0  . benzonatate (TESSALON) 200 MG capsule Take 1 capsule (200 mg total) by mouth 3 (three) times daily as needed for cough. 60 capsule 1  . cetirizine (ZYRTEC) 10 MG tablet Take 10 mg by mouth daily as needed for allergies.     . cholecalciferol (VITAMIN D) 1000 units tablet Take 2,000 Units by mouth 2 (two) times daily.    . clotrimazole-betamethasone (LOTRISONE) cream APPLY 1 APPLICATION TOPICALLY TWICE A DAY AS NEEDED FOR IRRITATION 30 g 2  . CONZIP 200 MG CP24 TAKE 1 CAPSULE DAILY 90 capsule 1  . dalfampridine (AMPYRA) 10 MG TB12 Take 1 tablet (10 mg total) by mouth 2 (two) times daily. 180 tablet 3  .  fluticasone (FLONASE) 50 MCG/ACT nasal spray Place 2 sprays into both nostrils daily as needed for rhinitis.     [name] gabapentin (NEURONTIN) 100 MG capsule TAKE 2 CAPSULES THREE TIMES A DAY AS NEEDED FOR PAIN 360 capsule 5  . HYDROcodone-homatropine (HYCODAN) 5-1.5 MG/5ML syrup Take 5 mLs by mouth 2 (two) times daily as needed for cough. 240 mL 0  . ibuprofen (ADVIL,MOTRIN) 600 MG tablet Take 1 tablet (600 mg total) by mouth every 6 (six) hours as needed. 30 tablet 0  . ketorolac (TORADOL) 10 MG tablet Take 1 tablet (10 mg total) by mouth daily as needed for severe pain. 30 tablet 2  . Multiple Vitamin (MULTIVITAMIN WITH MINERALS) TABS tablet Take 1 tablet by mouth daily.    . ondansetron (ZOFRAN ODT) 4 MG disintegrating tablet Take 1 tablet (4 mg total) by mouth every 8 (eight) hours as needed for nausea or vomiting. 20 tablet 0  . pantoprazole (PROTONIX) 40 MG tablet TAKE 1 TABLET DAILY 90 tablet 3   No current facility-administered medications for this visit.    Allergies: Meperidine and Prednisone  Past Medical History:  Diagnosis Date  . Arthritis   . Gallstone pancreatitis   . Hernia   . History of inguinal hernia repair   . Low back pain   . MS (multiple sclerosis) ([place])   . Pancreatitis hosp[italized with gallstone pancreatitis 3/12[  . Vitamin D deficiency     Past Surgical History:  Procedure Laterality Date  .  CHOLECYSTECTOMY  04/2010  . CYSTECTOMY     on back and R hand  . HERNIA REPAIR    . INGUINAL HERNIA REPAIR  03/17/2011   Procedure: HERNIA REPAIR INGUINAL ADULT;  Surgeon: Adin Hector, MD;  Location: [facility];  Service: General;  Laterality: Left;  Open left inguinal hernia repair with mesh  . KNEE ARTHROSCOPY     right  . NOSE SURGERY    . TONSILLECTOMY    . WISDOM TOOTH EXTRACTION      Family History  Problem Relation Age of Onset  . Cancer Father        ?  . Diabetes Mother     Social History   Tobacco Use  . Smoking status: Never  Smoker  . Smokeless tobacco: Never Used  Substance Use Topics  . Alcohol use: No    Subjective:   I connected with [name] on 03/14/19 at  1:40 PM EST by telephone call  and verified that I am speaking with the correct person using two identifiers.   I discussed the limitations of evaluation and management by telemedicine and the availability of in person appointments. The patient expressed understanding and agreed to proceed. Provider in office/ patient is at home; provider and patient are only 2 people on video call.   2 day history of diarrhea; notes that he has been feeling better today; ate oatmeal for breakfast this morning and has not had any diarrhea today; no fever or blood in the stool; is very concerned about a recurrence of his symptoms since yesterday was "so bad." No vomiting or abdominal pain;    Objective:  There were no vitals filed for this visit.  Lungs: Respirations unlabored;  Neurologic: Alert and oriented; speech intact; face symmetrical;   Assessment:  1. Viral gastroenteritis   2. Acute diarrhea     Plan:  Per patient, symptoms lasted x 24 hours and he has been symptom free today; has been able to keep oatmeal down with no difficulty; BRAT diet discussed; follow-up worse, no better;  Time spent 8 minutes  No follow-ups on file.  No orders of the defined types were placed in this encounter.   Requested Prescriptions    No prescriptions requested or ordered in this encounter

## 2019-04-08 DIAGNOSIS — D8989 Other specified disorders involving the immune mechanism, not elsewhere classified: Secondary | ICD-10-CM | POA: Diagnosis not present

## 2019-04-08 DIAGNOSIS — G35 Multiple sclerosis: Secondary | ICD-10-CM | POA: Diagnosis not present

## 2019-04-08 DIAGNOSIS — Z0389 Encounter for observation for other suspected diseases and conditions ruled out: Secondary | ICD-10-CM | POA: Diagnosis not present

## 2019-06-03 ENCOUNTER — Ambulatory Visit: Payer: Medicare Other | Admitting: Internal Medicine

## 2019-06-05 ENCOUNTER — Other Ambulatory Visit: Payer: Self-pay

## 2019-06-05 ENCOUNTER — Ambulatory Visit (INDEPENDENT_AMBULATORY_CARE_PROVIDER_SITE_OTHER): Payer: Medicare Other | Admitting: Internal Medicine

## 2019-06-05 ENCOUNTER — Encounter: Payer: Self-pay | Admitting: Internal Medicine

## 2019-06-05 VITALS — BP 136/86 | HR 97 | Temp 98.5°F | Ht 67.0 in | Wt 166.0 lb

## 2019-06-05 DIAGNOSIS — N320 Bladder-neck obstruction: Secondary | ICD-10-CM | POA: Diagnosis not present

## 2019-06-05 DIAGNOSIS — E559 Vitamin D deficiency, unspecified: Secondary | ICD-10-CM | POA: Diagnosis not present

## 2019-06-05 DIAGNOSIS — G8929 Other chronic pain: Secondary | ICD-10-CM

## 2019-06-05 DIAGNOSIS — G35 Multiple sclerosis: Secondary | ICD-10-CM

## 2019-06-05 DIAGNOSIS — M5440 Lumbago with sciatica, unspecified side: Secondary | ICD-10-CM

## 2019-06-05 DIAGNOSIS — R739 Hyperglycemia, unspecified: Secondary | ICD-10-CM

## 2019-06-05 DIAGNOSIS — R269 Unspecified abnormalities of gait and mobility: Secondary | ICD-10-CM | POA: Diagnosis not present

## 2019-06-05 DIAGNOSIS — E785 Hyperlipidemia, unspecified: Secondary | ICD-10-CM

## 2019-06-05 DIAGNOSIS — J4520 Mild intermittent asthma, uncomplicated: Secondary | ICD-10-CM

## 2019-06-05 NOTE — Progress Notes (Signed)
Subjective:  Patient ID: [name], male    DOB: 26-Sep-1955  Age: 64 y.o. MRN: [id number]  CC: No chief complaint on file.   HPI [name] presents for MS, chronic pain, GERD MS sx's worse, chronic cough Labs w/Neurology   Outpatient Medications Prior to Visit  Medication Sig Dispense Refill  . amitriptyline (ELAVIL) 25 MG tablet TAKE 2 TABLETS AT BEDTIME AS NEEDED FOR SLEEP (OFFICE VISIT NEEDED BD REFILLS WILL BE GIVEN) 180 tablet 0  . baclofen (LIORESAL) 20 MG tablet TAKE 1 TABLET FOUR TIMES A DAY AS NEEDED FOR MUSCLE SPASMS (OFFICE VISIT NEEDED BEFORE REFILLS WILL BE GIVEN) 360 tablet 0  . cetirizine (ZYRTEC) 10 MG tablet Take 10 mg by mouth daily as needed for allergies.     . cholecalciferol (VITAMIN D) 1000 units tablet Take 2,000 Units by mouth 2 (two) times daily.    . clotrimazole-betamethasone (LOTRISONE) cream APPLY 1 APPLICATION TOPICALLY TWICE A DAY AS NEEDED FOR IRRITATION 30 g 2  . CONZIP 200 MG CP24 TAKE 1 CAPSULE DAILY 90 capsule 1  . dalfampridine (AMPYRA) 10 MG TB12 Take 1 tablet (10 mg total) by mouth 2 (two) times daily. 180 tablet 3  . fluticasone (FLONASE) 50 MCG/ACT nasal spray Place 2 sprays into both nostrils daily as needed for rhinitis.     [name] gabapentin (NEURONTIN) 100 MG capsule TAKE 2 CAPSULES THREE TIMES A DAY AS NEEDED FOR PAIN 360 capsule 5  . ibuprofen (ADVIL,MOTRIN) 600 MG tablet Take 1 tablet (600 mg total) by mouth every 6 (six) hours as needed. 30 tablet 0  . ketorolac (TORADOL) 10 MG tablet Take 1 tablet (10 mg total) by mouth daily as needed for severe pain. 30 tablet 2  . Multiple Vitamin (MULTIVITAMIN WITH MINERALS) TABS tablet Take 1 tablet by mouth daily.    . ondansetron (ZOFRAN ODT) 4 MG disintegrating tablet Take 1 tablet (4 mg total) by mouth every 8 (eight) hours as needed for nausea or vomiting. 20 tablet 0  . pantoprazole (PROTONIX) 40 MG tablet TAKE 1 TABLET DAILY 90 tablet 3  . benzonatate (TESSALON) 200 MG capsule Take 1  capsule (200 mg total) by mouth 3 (three) times daily as needed for cough. 60 capsule 1  . HYDROcodone-homatropine (HYCODAN) 5-1.5 MG/5ML syrup Take 5 mLs by mouth 2 (two) times daily as needed for cough. 240 mL 0   No facility-administered medications prior to visit.    ROS: Review of Systems  Constitutional: Positive for fatigue. Negative for appetite change and unexpected weight change.  HENT: Negative for congestion, nosebleeds, sneezing, sore throat and trouble swallowing.   Eyes: Negative for itching and visual disturbance.  Respiratory: Positive for cough.   Cardiovascular: Negative for chest pain, palpitations and leg swelling.  Gastrointestinal: Negative for abdominal distention, blood in stool, diarrhea and nausea.  Genitourinary: Negative for frequency and hematuria.  Musculoskeletal: Positive for arthralgias, back pain and gait problem. Negative for joint swelling and neck pain.  Skin: Negative for rash.  Neurological: Positive for weakness. Negative for dizziness, tremors and speech difficulty.  Psychiatric/Behavioral: Negative for agitation, dysphoric mood and sleep disturbance. The patient is not nervous/anxious.     Objective:  BP 136/86 (BP Location: Left Arm, Patient Position: Sitting, Cuff Size: Normal)   Pulse 97   Temp 98.5 F (36.9 C) (Oral)   Ht 5\' 7"  (1.702 m)   Wt 166 lb (75.3 kg)   SpO2 98%   BMI 26.00 kg/m   BP Readings from Last  3 Encounters:  06/05/19 136/86  12/03/18 128/82  02/27/18 128/84    Wt Readings from Last 3 Encounters:  06/05/19 166 lb (75.3 kg)  12/03/18 170 lb (77.1 kg)  02/27/18 172 lb (78 kg)    Physical Exam Constitutional:      General: He is not in acute distress.    Appearance: He is well-developed.     Comments: NAD  Eyes:     Conjunctiva/sclera: Conjunctivae normal.     Pupils: Pupils are equal, round, and reactive to light.  Neck:     Thyroid: No thyromegaly.     Vascular: No JVD.  Cardiovascular:     Rate and  Rhythm: Normal rate and regular rhythm.     Heart sounds: Normal heart sounds. No murmur. No friction rub. No gallop.   Pulmonary:     Effort: Pulmonary effort is normal. No respiratory distress.     Breath sounds: Normal breath sounds. No wheezing or rales.  Chest:     Chest wall: No tenderness.  Abdominal:     General: Bowel sounds are normal. There is no distension.     Palpations: Abdomen is soft. There is no mass.     Tenderness: There is no abdominal tenderness. There is no guarding or rebound.  Musculoskeletal:        General: Tenderness present. Normal range of motion.     Cervical back: Normal range of motion.  Lymphadenopathy:     Cervical: No cervical adenopathy.  Skin:    General: Skin is warm and dry.     Findings: No rash.  Neurological:     Mental Status: He is alert and oriented to person, place, and time.     Cranial Nerves: No cranial nerve deficit.     Motor: Weakness present. No abnormal muscle tone.     Coordination: Coordination normal.     Gait: Gait abnormal.     Deep Tendon Reflexes: Reflexes are normal and symmetric.  Psychiatric:        Behavior: Behavior normal.        Thought Content: Thought content normal.        Judgment: Judgment normal.    Using two poles  L>>RLE weakness Ataxic, slow pace   Lab Results  Component Value Date   WBC 16.7 (H) 01/30/2018   HGB 15.7 01/30/2018   HCT 47.5 01/30/2018   PLT 195 01/30/2018   GLUCOSE 138 (H) 01/30/2018   CHOL 182 11/14/2017   TRIG 107.0 11/14/2017   HDL 48.50 11/14/2017   LDLDIRECT 118.4 06/09/2009   LDLCALC 112 (H) 11/14/2017   ALT 22 01/30/2018   AST 18 01/30/2018   NA 141 01/30/2018   K 4.1 01/30/2018   CL 103 01/30/2018   CREATININE 1.08 01/30/2018   BUN 16 01/30/2018   CO2 28 01/30/2018   TSH 3.75 11/14/2017   PSA 2.75 11/14/2017   INR 1.0 08/24/2008    DG Chest 2 View  Result Date: 02/27/2018 CLINICAL DATA:  Productive cough for 2 weeks.  Acute bronchitis. EXAM: CHEST - 2  VIEW COMPARISON:  04/19/2009 FINDINGS: The heart size and mediastinal contours are within normal limits. Both lungs are clear. The visualized skeletal structures are unremarkable. IMPRESSION: No active cardiopulmonary disease. Electronically Signed   By: Earle Gell M.D.   On: 02/27/2018 16:13    Assessment & Plan:   There are no diagnoses linked to this encounter.   No orders of the defined types were placed in this encounter.  Walker Kehr, MD

## 2019-06-05 NOTE — Assessment & Plan Note (Signed)
Vit D 

## 2019-06-05 NOTE — Assessment & Plan Note (Signed)
Chronic cough - Tessalon not helping Cough syrup prn

## 2019-06-05 NOTE — Assessment & Plan Note (Signed)
No change 

## 2019-06-05 NOTE — Assessment & Plan Note (Signed)
On Tramadol Potential benefits of a long term Tramadol/opioids use as well as potential risks (i.e. addiction risk, apnea etc) and complications (i.e. Somnolence, constipation and others) were explained to the patient and were aknowledged. On Baclofen

## 2019-06-05 NOTE — Assessment & Plan Note (Signed)
On Ocrevus

## 2019-06-08 ENCOUNTER — Other Ambulatory Visit: Payer: Self-pay | Admitting: Internal Medicine

## 2019-06-09 ENCOUNTER — Encounter: Payer: Self-pay | Admitting: Internal Medicine

## 2019-06-09 ENCOUNTER — Telehealth (INDEPENDENT_AMBULATORY_CARE_PROVIDER_SITE_OTHER): Payer: Medicare Other | Admitting: Internal Medicine

## 2019-06-09 DIAGNOSIS — A09 Infectious gastroenteritis and colitis, unspecified: Secondary | ICD-10-CM

## 2019-06-09 MED ORDER — CIPROFLOXACIN HCL 500 MG PO TABS: 500.0000 mg | ORAL_TABLET | Freq: Two times a day (BID) | ORAL | 0 refills | Status: DC

## 2019-06-09 NOTE — Progress Notes (Signed)
Virtual Visit via Video Note  I connected with [name] on 06/09/19 at  3:20 PM EDT by a video enabled telemedicine application and verified that I am speaking with the correct person using two identifiers.   I discussed the limitations of evaluation and management by telemedicine and the availability of in person appointments. The patient expressed understanding and agreed to proceed.  History of Present Illness: C/o frequent snmall stools w/clear to yellow mucus since last Thursday. He ate seafood ad GD's wedding at the [place] - came home on [place] last week. No abd pain. Pepto-bismol, Imodium did not help  There has been nobdominal pain, fever, arthralgias, skin rashes.   Observations/Objective: The patient appears to be in no acute distress, looks OK  Assessment and Plan:  See my Assessment and Plan. Follow Up Instructions:    I discussed the assessment and treatment plan with the patient. The patient was provided an opportunity to ask questions and all were answered. The patient agreed with the plan and demonstrated an understanding of the instructions.   The patient was advised to call back or seek an in-person evaluation if the symptoms worsen or if the condition fails to improve as anticipated.  I provided face-to-face time during this encounter. We were at different locations.   Walker Kehr, MD

## 2019-06-09 NOTE — Assessment & Plan Note (Signed)
Acute - ?bacterial Cipro po Probiotic

## 2019-06-12 MED ORDER — CIPROFLOXACIN HCL 500 MG PO TABS: 500.0000 mg | ORAL_TABLET | Freq: Two times a day (BID) | ORAL | 0 refills | Status: DC

## 2019-06-30 DIAGNOSIS — G35 Multiple sclerosis: Secondary | ICD-10-CM | POA: Diagnosis not present

## 2019-06-30 DIAGNOSIS — M47812 Spondylosis without myelopathy or radiculopathy, cervical region: Secondary | ICD-10-CM | POA: Diagnosis not present

## 2019-06-30 DIAGNOSIS — G9389 Other specified disorders of brain: Secondary | ICD-10-CM | POA: Diagnosis not present

## 2019-06-30 DIAGNOSIS — M4802 Spinal stenosis, cervical region: Secondary | ICD-10-CM | POA: Diagnosis not present

## 2019-06-30 DIAGNOSIS — G9589 Other specified diseases of spinal cord: Secondary | ICD-10-CM | POA: Diagnosis not present

## 2019-07-03 ENCOUNTER — Encounter: Payer: Self-pay | Admitting: Internal Medicine

## 2019-07-03 ENCOUNTER — Telehealth (INDEPENDENT_AMBULATORY_CARE_PROVIDER_SITE_OTHER): Payer: Medicare Other | Admitting: Internal Medicine

## 2019-07-03 DIAGNOSIS — A09 Infectious gastroenteritis and colitis, unspecified: Secondary | ICD-10-CM | POA: Diagnosis not present

## 2019-07-03 DIAGNOSIS — G35 Multiple sclerosis: Secondary | ICD-10-CM

## 2019-07-03 MED ORDER — DIPHENOXYLATE-ATROPINE 2.5-0.025 MG PO TABS: 1.0000 | ORAL_TABLET | Freq: Four times a day (QID) | ORAL | 0 refills | Status: DC | PRN

## 2019-07-03 MED ORDER — SACCHAROMYCES BOULARDII 250 MG PO CAPS
250.0000 mg | ORAL_CAPSULE | Freq: Two times a day (BID) | ORAL | 0 refills | Status: DC
Start: 2019-07-03 — End: 2020-02-05

## 2019-07-03 MED ORDER — CIPROFLOXACIN HCL 500 MG PO TABS: 500.0000 mg | ORAL_TABLET | Freq: Two times a day (BID) | ORAL | 0 refills | Status: DC

## 2019-07-03 NOTE — Progress Notes (Signed)
Virtual Visit via Telephone Note  I connected with [name] on 07/03/19 at  4:00 PM EDT by telephone and verified that I am speaking with the correct person using two identifiers.   I discussed the limitations, risks, security and privacy concerns of performing an evaluation and management service by telephone and the availability of in person appointments. I also discussed with the patient that there may be a patient responsible charge related to this service. The patient expressed understanding and agreed to proceed.   History of Present Illness:   Will try to video connect, however, we failed.  [name] was doing well for 10 days after short course of Cipro.  The other day he had a chicken sandwich for lunch and drank a can of Sprite, which he thinks caused the recurrence of watery diarrhea again.  He has been having 5-6 loose stools a day for the past couple days.  He woke up twice at night to have a BM.  No abdominal pain.  No fever. Observations/Objective:  He sounds normal on the phone Assessment and Plan:  See plan Follow Up Instructions:    I discussed the assessment and treatment plan with the patient. The patient was provided an opportunity to ask questions and all were answered. The patient agreed with the plan and demonstrated an understanding of the instructions.   The patient was advised to call back or seek an in-person evaluation if the symptoms worsen or if the condition fails to improve as anticipated.  I provided 21 minutes of non-face-to-face time during this encounter.   Walker Kehr, MD

## 2019-07-03 NOTE — Assessment & Plan Note (Signed)
No change 

## 2019-07-03 NOTE — Assessment & Plan Note (Signed)
Recurrent episode of probably bacterial diarrhea. We will repeat the course of Cipro Align po Stool tests Lomotil as needed Diet discussed.

## 2019-07-09 ENCOUNTER — Telehealth (INDEPENDENT_AMBULATORY_CARE_PROVIDER_SITE_OTHER): Payer: Medicare Other | Admitting: Internal Medicine

## 2019-07-09 DIAGNOSIS — G35 Multiple sclerosis: Secondary | ICD-10-CM | POA: Diagnosis not present

## 2019-07-09 DIAGNOSIS — A09 Infectious gastroenteritis and colitis, unspecified: Secondary | ICD-10-CM | POA: Diagnosis not present

## 2019-07-09 MED ORDER — ZENPEP 25000-79000 UNITS PO CPEP: 1.0000 | ORAL_CAPSULE | Freq: Three times a day (TID) | ORAL | 1 refills | Status: DC

## 2019-07-09 MED ORDER — METRONIDAZOLE 500 MG PO TABS
500.0000 mg | ORAL_TABLET | Freq: Three times a day (TID) | ORAL | 0 refills | Status: DC
Start: 2019-07-09 — End: 2019-09-03

## 2019-07-09 NOTE — Progress Notes (Addendum)
Virtual Visit via Video Note  I connected with [name] on 07/09/19 at 11:00 AM EDT by a video enabled telemedicine application and verified that I am speaking with the correct person using two identifiers.   I discussed the limitations of evaluation and management by telemedicine and the availability of in person appointments. The patient expressed understanding and agreed to proceed.  I was located at our [facility] office. The patient was at home. There was no one else present in the visit.   History of Present Illness: We need to follow-up on diarrheal illness.  It has gotten better and resolved, then reoccurred.  The patient is complaining of several loose stools a day.  They are watery.  No blood.  Minor abdominal discomfort.  No fever.  No nausea or vomiting  There has been no runny nose, cough, chest pain, shortness of breath, abdominal pain, constipation, arthralgias, skin rashes.   Observations/Objective: The patient appears to be in no acute distress, looks ok  Assessment and Plan:  See my Assessment and Plan. Follow Up Instructions:    I discussed the assessment and treatment plan with the patient. The patient was provided an opportunity to ask questions and all were answered. The patient agreed with the plan and demonstrated an understanding of the instructions.   The patient was advised to call back or seek an in-person evaluation if the symptoms worsen or if the condition fails to improve as anticipated.  I provided face-to-face time during this encounter. We were at different locations.   Walker Kehr, MD

## 2019-07-11 ENCOUNTER — Other Ambulatory Visit (INDEPENDENT_AMBULATORY_CARE_PROVIDER_SITE_OTHER): Payer: Medicare Other

## 2019-07-11 DIAGNOSIS — R739 Hyperglycemia, unspecified: Secondary | ICD-10-CM

## 2019-07-11 DIAGNOSIS — E785 Hyperlipidemia, unspecified: Secondary | ICD-10-CM

## 2019-07-11 DIAGNOSIS — G35 Multiple sclerosis: Secondary | ICD-10-CM

## 2019-07-11 DIAGNOSIS — N320 Bladder-neck obstruction: Secondary | ICD-10-CM

## 2019-07-11 LAB — CBC WITH DIFFERENTIAL/PLATELET
Basophils Absolute: 0.1 10*3/uL (ref 0.0–0.1)
Basophils Relative: 0.9 % (ref 0.0–3.0)
Eosinophils Absolute: 0.1 10*3/uL (ref 0.0–0.7)
Eosinophils Relative: 2.5 % (ref 0.0–5.0)
HCT: 41 % (ref 39.0–52.0)
Hemoglobin: 13.9 g/dL (ref 13.0–17.0)
Lymphocytes Relative: 20.8 % (ref 12.0–46.0)
Lymphs Abs: 1.2 10*3/uL (ref 0.7–4.0)
MCHC: 34 g/dL (ref 30.0–36.0)
MCV: 91.1 fl (ref 78.0–100.0)
Monocytes Absolute: 0.5 10*3/uL (ref 0.1–1.0)
Monocytes Relative: 9.2 % (ref 3.0–12.0)
Neutro Abs: 3.8 10*3/uL (ref 1.4–7.7)
Neutrophils Relative %: 66.6 % (ref 43.0–77.0)
Platelets: 206 10*3/uL (ref 150.0–400.0)
RBC: 4.5 Mil/uL (ref 4.22–5.81)
RDW: 12.6 % (ref 11.5–15.5)
WBC: 5.8 10*3/uL (ref 4.0–10.5)

## 2019-07-11 LAB — HEPATIC FUNCTION PANEL
ALT: 15 U/L (ref 0–53)
AST: 19 U/L (ref 0–37)
Albumin: 3.7 g/dL (ref 3.5–5.2)
Alkaline Phosphatase: 64 U/L (ref 39–117)
Bilirubin, Direct: 0.1 mg/dL (ref 0.0–0.3)
Total Bilirubin: 0.3 mg/dL (ref 0.2–1.2)
Total Protein: 5.9 g/dL — ABNORMAL LOW (ref 6.0–8.3)

## 2019-07-11 LAB — BASIC METABOLIC PANEL
BUN: 13 mg/dL (ref 6–23)
CO2: 31 mEq/L (ref 19–32)
Calcium: 8.5 mg/dL (ref 8.4–10.5)
Chloride: 103 mEq/L (ref 96–112)
Creatinine, Ser: 0.88 mg/dL (ref 0.40–1.50)
GFR: 87.07 mL/min (ref >60.00)
Glucose, Bld: 115 mg/dL — ABNORMAL HIGH (ref 70–99)
Potassium: 4.1 mEq/L (ref 3.5–5.1)
Sodium: 139 mEq/L (ref 135–145)

## 2019-07-11 LAB — URINALYSIS
Bilirubin Urine: NEGATIVE
Hgb urine dipstick: NEGATIVE
Ketones, ur: 15 — AB
Leukocytes,Ua: NEGATIVE
Nitrite: NEGATIVE
Specific Gravity, Urine: 1.02 (ref 1.000–1.030)
Total Protein, Urine: NEGATIVE
Urine Glucose: NEGATIVE
Urobilinogen, UA: 0.2 (ref 0.0–1.0)
pH: 6 (ref 5.0–8.0)

## 2019-07-11 LAB — LIPID PANEL
Cholesterol: 130 mg/dL (ref 0–200)
HDL: 39.4 mg/dL (ref >39.00)
LDL Cholesterol: 81 mg/dL (ref 0–99)
NonHDL: 90.66
Total CHOL/HDL Ratio: 3
Triglycerides: 49 mg/dL (ref 0.0–149.0)
VLDL: 9.8 mg/dL (ref 0.0–40.0)

## 2019-07-11 LAB — PSA: PSA: 3.78 ng/mL (ref 0.10–4.00)

## 2019-07-11 LAB — HEMOGLOBIN A1C: Hgb A1c MFr Bld: 5.8 % (ref 4.6–6.5)

## 2019-07-11 LAB — TSH: TSH: 3.05 u[IU]/mL (ref 0.35–4.50)

## 2019-07-12 ENCOUNTER — Encounter: Payer: Self-pay | Admitting: Internal Medicine

## 2019-07-12 NOTE — Assessment & Plan Note (Signed)
Recurrent episodes of probably bacterial diarrhea. We will prescribe Flagyl Align po Stool tests for C. difficile colitis, Giardia Lomotil as needed Diet discussed. GI referral discussed

## 2019-07-12 NOTE — Assessment & Plan Note (Signed)
Unchanged symptoms

## 2019-07-14 ENCOUNTER — Other Ambulatory Visit: Payer: Medicare Other

## 2019-07-14 DIAGNOSIS — A09 Infectious gastroenteritis and colitis, unspecified: Secondary | ICD-10-CM

## 2019-07-16 ENCOUNTER — Telehealth: Payer: Self-pay | Admitting: Internal Medicine

## 2019-07-16 LAB — CLOSTRIDIUM DIFFICILE EIA: C difficile Toxins A+B, EIA: NEGATIVE

## 2019-07-16 LAB — GIARDIA/CRYPTOSPORIDIUM (EIA)
MICRO NUMBER:: 10560393
MICRO NUMBER:: 10560394
RESULT:: NOT DETECTED
RESULT:: NOT DETECTED
SPECIMEN QUALITY:: ADEQUATE
SPECIMEN QUALITY:: ADEQUATE

## 2019-07-16 NOTE — Telephone Encounter (Addendum)
Zenpep 25000-79000 capsule PA initatied vis CoverMyMeds.  KeyRhea Pink  PA Case ID: [id number]  Pt informed via MyChart.

## 2019-07-16 NOTE — Telephone Encounter (Signed)
New message:    [name] is calling from [facility] to get a prior authorization for Pancrelipase, Lip-Prot-Amyl, (ZENPEP) 25000-79000 units CPEP for the pt. She states they faxed over a form on yesterday. Please advise.

## 2019-07-22 NOTE — Telephone Encounter (Signed)
See 07/15/19 patient message. Pt was informed Zenpep PA was denied. He has requested help with patient assistance. Patient assistance forms printed off, MD sections completed and signed by Dr. Alain Marion.  Forms are upfront for pt to p/u and complete his sections then send to [facility] patient assistance program to see if he is eligible.  Pt informed via MyChart.   Phone: [phone] Fax: [phone]

## 2019-07-23 ENCOUNTER — Other Ambulatory Visit: Payer: Self-pay | Admitting: Internal Medicine

## 2019-07-23 DIAGNOSIS — A09 Infectious gastroenteritis and colitis, unspecified: Secondary | ICD-10-CM

## 2019-07-23 MED ORDER — TRAMADOL HCL (ER BIPHASIC) 200 MG PO CP24: 1.0000 | ORAL_CAPSULE | Freq: Every day | ORAL | 1 refills | Status: DC

## 2019-08-01 IMAGING — CT CT ABD-PELV W/ CM
2 of 5 series · 15 of 46 positions shown, 17 images · IV contrast (iopamidol)
Comparison: MRCP performed 04/22/2010

CLINICAL DATA: Acute onset of right lower quadrant abdominal pain
and leukocytosis.

EXAM:
CT ABDOMEN AND PELVIS WITH CONTRAST
TECHNIQUE: Multidetector CT imaging of the abdomen and pelvis was performed
using the standard protocol following bolus administration of
intravenous contrast.
CONTRAST:  100mL ES3DGD-2ZZ IOPAMIDOL (ES3DGD-2ZZ) INJECTION 61%

[Series 2: axial st · axial · 0.68mm/px · z∈[+1154,+1544]mm · 12 of 90 slices shown, 14 images]
[im 6/90  soft-tissue]
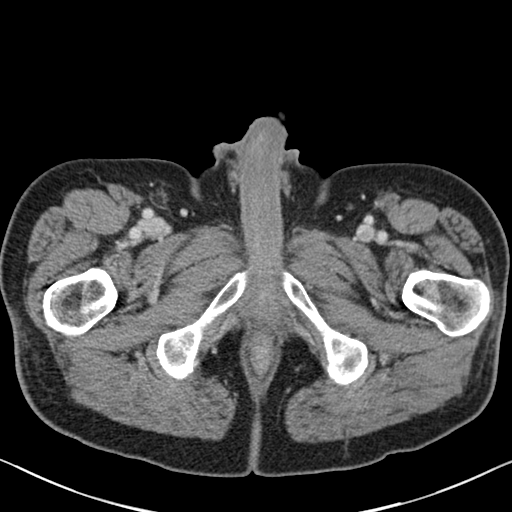
[im 6/90  bone]
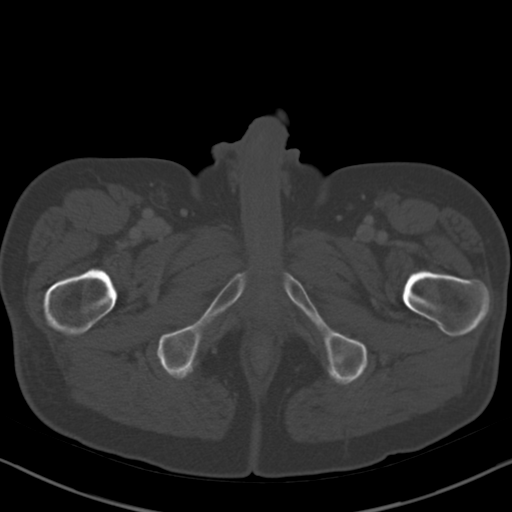
[im 12/90  soft-tissue]
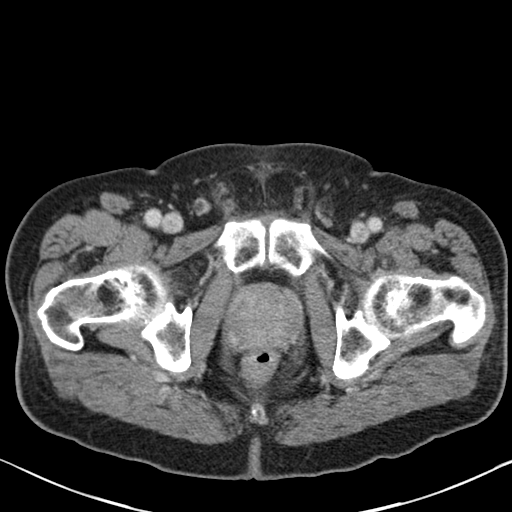
[im 18/90  soft-tissue]
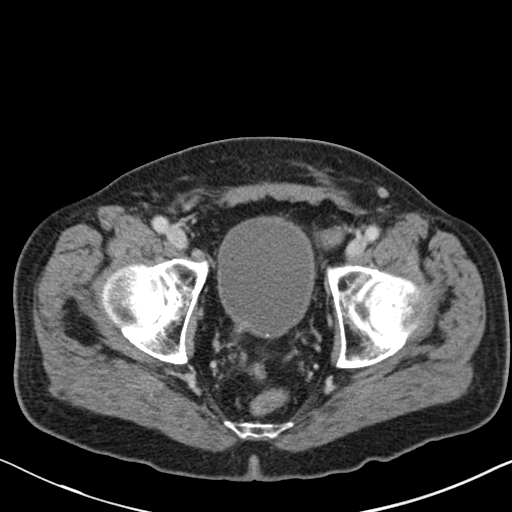
[im 30/90  soft-tissue]
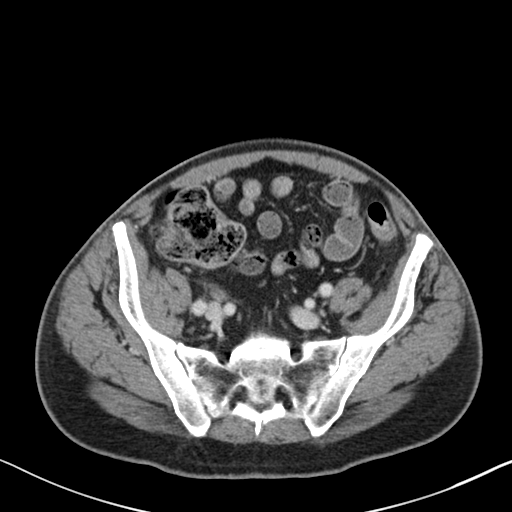
[im 36/90  soft-tissue]
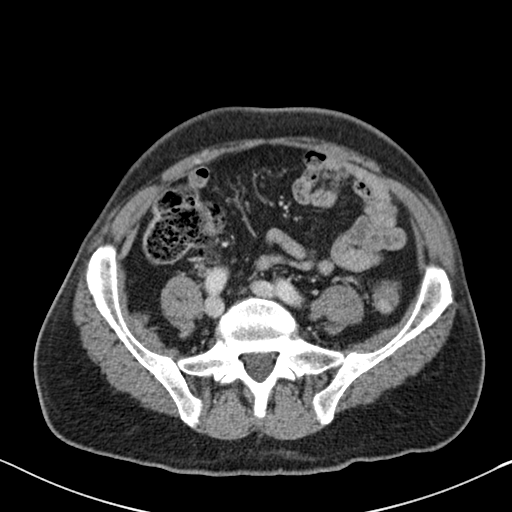
[im 42/90  soft-tissue]
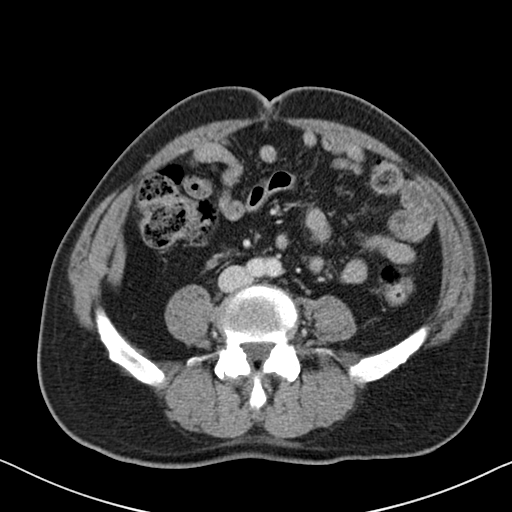
[im 48/90  soft-tissue]
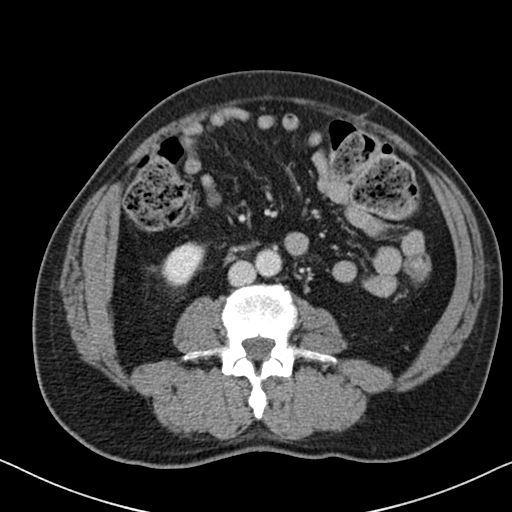
[im 54/90  soft-tissue]
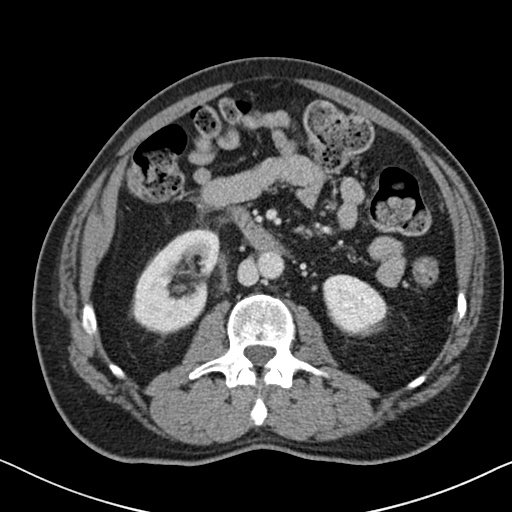
[im 60/90  soft-tissue]
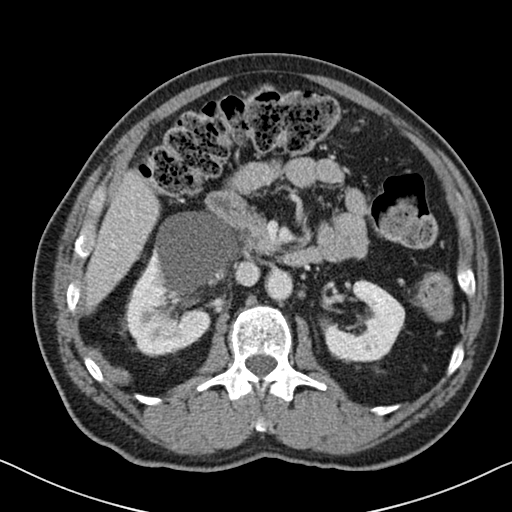
[im 60/90  bone]
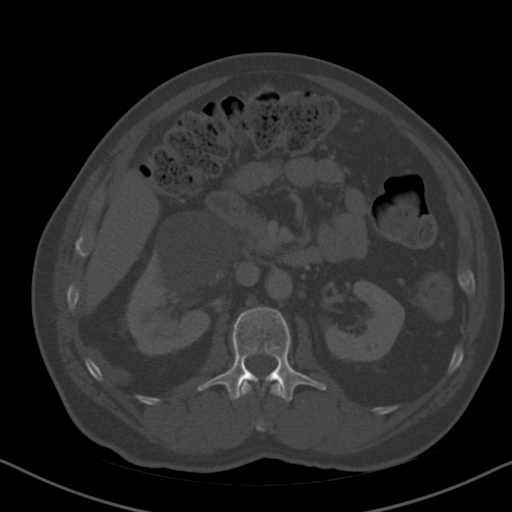
[im 72/90  soft-tissue]
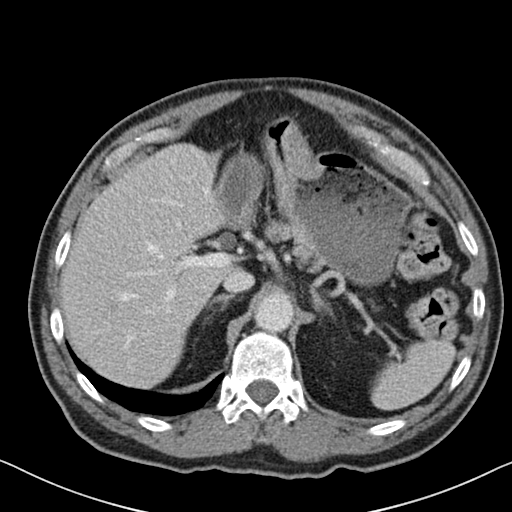
[im 78/90  soft-tissue]
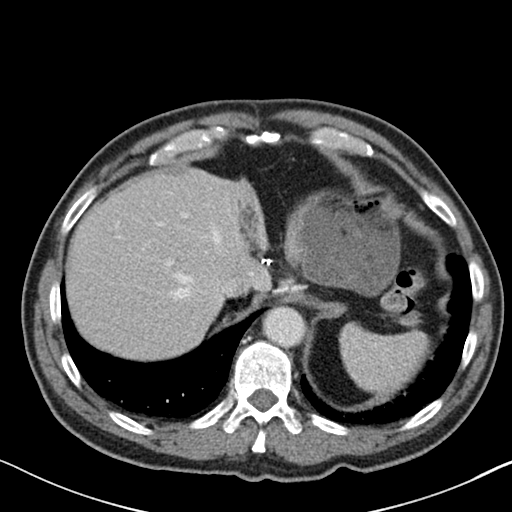
[im 84/90  soft-tissue]
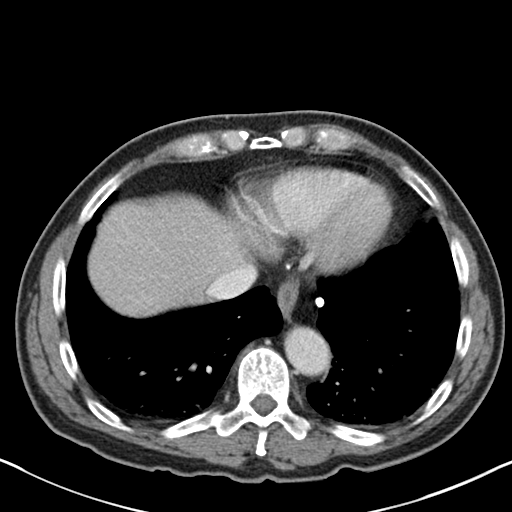

[Series 4: coronal st · coronal · 0.60mm/px · 3 of 101 slices shown]
[im 34/101  soft-tissue]
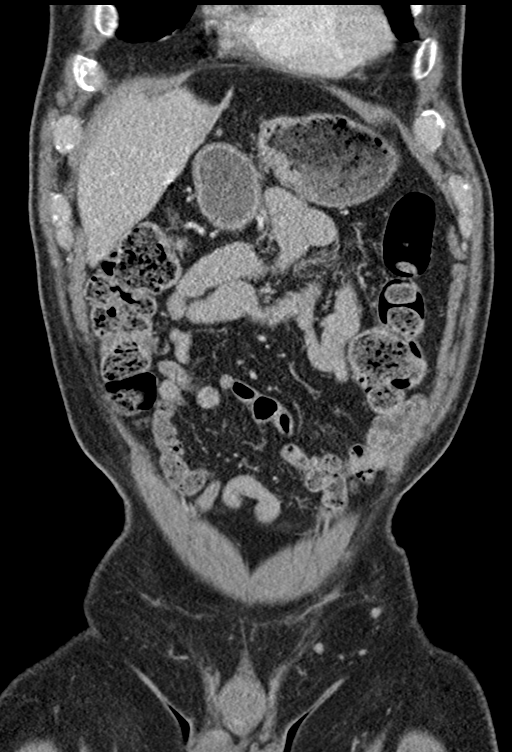
[im 45/101  soft-tissue]
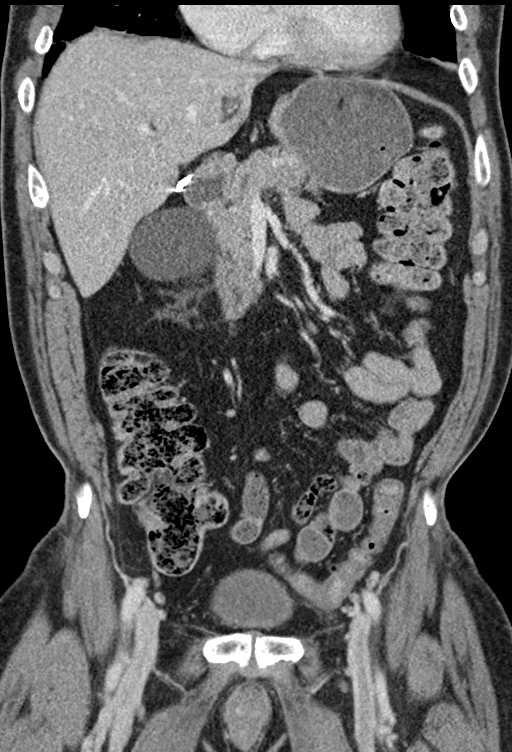
[im 56/101  soft-tissue]
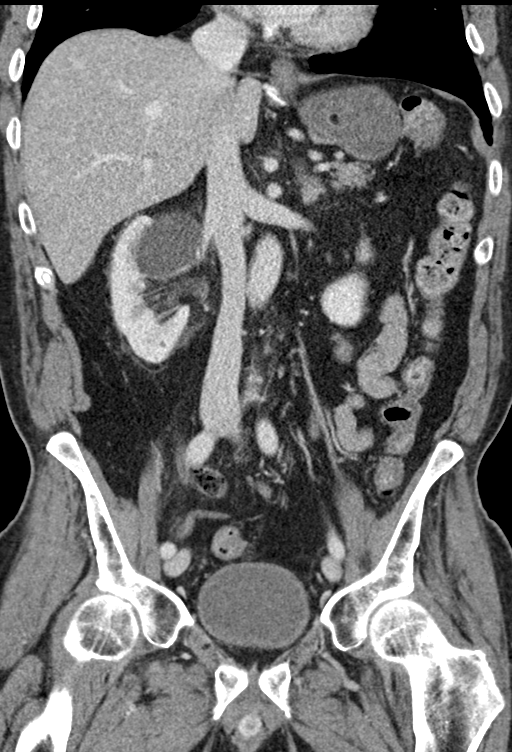

[15 of 46 positions shown; findings below may reference images not displayed]

FINDINGS: Lower chest: Mild bibasilar atelectasis is noted. A few calcified
granulomata are noted at the medial left lung base. The visualized
portions of the mediastinum are unremarkable.

Hepatobiliary: The liver is unremarkable in appearance. The patient
is status post cholecystectomy, with clips noted at the gallbladder
fossa. The common bile duct remains normal in caliber.

Pancreas: The pancreas is within normal limits.

Spleen: The spleen is unremarkable in appearance.

Adrenals/Urinary Tract: The adrenal glands are unremarkable in
appearance.

Bilateral perinephric stranding is noted, more prominent on the
right. There is mild ureteral wall enhancement along the right
ureter. This is concerning for right-sided ureteritis. An underlying
2 mm stone is noted at the base of the bladder, likely reflecting a
recently passed stone. No significant hydronephrosis is now seen.

Right renal cysts are noted. The left kidney is unremarkable in
appearance. No nonobstructing renal stones are identified.

Stomach/Bowel: The stomach is unremarkable in appearance. The small
bowel is within normal limits. The appendix is not visualized; there
is no evidence for appendicitis. The colon is unremarkable in
appearance.

Vascular/Lymphatic: The abdominal aorta is unremarkable in
appearance. The inferior vena cava is grossly unremarkable. No
retroperitoneal lymphadenopathy is seen. No pelvic sidewall
lymphadenopathy is identified.

Reproductive: The bladder is mildly distended and otherwise
unremarkable. The prostate is mildly enlarged, measuring 5.3 cm in
transverse dimension.

Other: A small left inguinal hernia is noted, containing only fat.

Musculoskeletal: No acute osseous abnormalities are identified. The
visualized musculature is unremarkable in appearance.
IMPRESSION: 1. Mild ureteral wall enhancement along the right ureter. This is
concerning for right-sided ureteritis. Underlying 2 mm stone noted
at the base of the bladder, likely reflecting a recently passed
stone. No significant hydronephrosis now seen.
2. Right renal cysts noted.
3. Mildly enlarged prostate.
4. Small left inguinal hernia, containing only fat.

## 2019-08-29 ENCOUNTER — Emergency Department (HOSPITAL_COMMUNITY): Payer: Medicare Other

## 2019-08-29 ENCOUNTER — Encounter (HOSPITAL_COMMUNITY): Payer: Self-pay

## 2019-08-29 ENCOUNTER — Inpatient Hospital Stay (HOSPITAL_COMMUNITY)
Admission: EM | Admit: 2019-08-29 | Discharge: 2019-09-03 | DRG: 872 | Disposition: A | Discharge status: Home / Self Care | Payer: Medicare Other | Attending: Internal Medicine | Admitting: Internal Medicine

## 2019-08-29 ENCOUNTER — Other Ambulatory Visit: Payer: Self-pay

## 2019-08-29 DIAGNOSIS — Z8719 Personal history of other diseases of the digestive system: Secondary | ICD-10-CM | POA: Diagnosis not present

## 2019-08-29 DIAGNOSIS — G629 Polyneuropathy, unspecified: Secondary | ICD-10-CM | POA: Diagnosis present

## 2019-08-29 DIAGNOSIS — K5900 Constipation, unspecified: Secondary | ICD-10-CM | POA: Diagnosis present

## 2019-08-29 DIAGNOSIS — R197 Diarrhea, unspecified: Secondary | ICD-10-CM | POA: Diagnosis not present

## 2019-08-29 DIAGNOSIS — Z9049 Acquired absence of other specified parts of digestive tract: Secondary | ICD-10-CM | POA: Diagnosis not present

## 2019-08-29 DIAGNOSIS — R933 Abnormal findings on diagnostic imaging of other parts of digestive tract: Secondary | ICD-10-CM | POA: Diagnosis not present

## 2019-08-29 DIAGNOSIS — A09 Infectious gastroenteritis and colitis, unspecified: Secondary | ICD-10-CM

## 2019-08-29 DIAGNOSIS — K921 Melena: Secondary | ICD-10-CM

## 2019-08-29 DIAGNOSIS — D120 Benign neoplasm of cecum: Secondary | ICD-10-CM

## 2019-08-29 DIAGNOSIS — Z888 Allergy status to other drugs, medicaments and biological substances status: Secondary | ICD-10-CM | POA: Diagnosis not present

## 2019-08-29 DIAGNOSIS — N281 Cyst of kidney, acquired: Secondary | ICD-10-CM | POA: Diagnosis not present

## 2019-08-29 DIAGNOSIS — M1990 Unspecified osteoarthritis, unspecified site: Secondary | ICD-10-CM | POA: Diagnosis present

## 2019-08-29 DIAGNOSIS — E876 Hypokalemia: Secondary | ICD-10-CM | POA: Diagnosis present

## 2019-08-29 DIAGNOSIS — Z79899 Other long term (current) drug therapy: Secondary | ICD-10-CM | POA: Diagnosis not present

## 2019-08-29 DIAGNOSIS — Z833 Family history of diabetes mellitus: Secondary | ICD-10-CM | POA: Diagnosis not present

## 2019-08-29 DIAGNOSIS — R202 Paresthesia of skin: Secondary | ICD-10-CM | POA: Diagnosis not present

## 2019-08-29 DIAGNOSIS — K635 Polyp of colon: Secondary | ICD-10-CM | POA: Diagnosis not present

## 2019-08-29 DIAGNOSIS — Z20822 Contact with and (suspected) exposure to covid-19: Secondary | ICD-10-CM | POA: Diagnosis present

## 2019-08-29 DIAGNOSIS — W19XXXA Unspecified fall, initial encounter: Secondary | ICD-10-CM | POA: Diagnosis not present

## 2019-08-29 DIAGNOSIS — K861 Other chronic pancreatitis: Secondary | ICD-10-CM | POA: Diagnosis present

## 2019-08-29 DIAGNOSIS — G35 Multiple sclerosis: Secondary | ICD-10-CM | POA: Diagnosis present

## 2019-08-29 DIAGNOSIS — K219 Gastro-esophageal reflux disease without esophagitis: Secondary | ICD-10-CM | POA: Diagnosis present

## 2019-08-29 DIAGNOSIS — K529 Noninfective gastroenteritis and colitis, unspecified: Secondary | ICD-10-CM | POA: Diagnosis present

## 2019-08-29 DIAGNOSIS — E559 Vitamin D deficiency, unspecified: Secondary | ICD-10-CM | POA: Diagnosis present

## 2019-08-29 DIAGNOSIS — R531 Weakness: Secondary | ICD-10-CM | POA: Diagnosis not present

## 2019-08-29 DIAGNOSIS — E860 Dehydration: Secondary | ICD-10-CM | POA: Diagnosis present

## 2019-08-29 DIAGNOSIS — J45909 Unspecified asthma, uncomplicated: Secondary | ICD-10-CM | POA: Diagnosis present

## 2019-08-29 DIAGNOSIS — A419 Sepsis, unspecified organism: Secondary | ICD-10-CM | POA: Diagnosis not present

## 2019-08-29 DIAGNOSIS — K5150 Left sided colitis without complications: Secondary | ICD-10-CM | POA: Diagnosis not present

## 2019-08-29 DIAGNOSIS — I10 Essential (primary) hypertension: Secondary | ICD-10-CM | POA: Diagnosis not present

## 2019-08-29 HISTORY — DX: Unspecified asthma, uncomplicated: J45.909

## 2019-08-29 LAB — BASIC METABOLIC PANEL
Anion gap: 9 (ref 5–15)
BUN: 11 mg/dL (ref 8–23)
CO2: 27 mmol/L (ref 22–32)
Calcium: 8.2 mg/dL — ABNORMAL LOW (ref 8.9–10.3)
Chloride: 99 mmol/L (ref 98–111)
Creatinine, Ser: 0.74 mg/dL (ref 0.61–1.24)
GFR calc Af Amer: >60 mL/min (ref >60)
GFR calc non Af Amer: >60 mL/min (ref >60)
Glucose, Bld: 122 mg/dL — ABNORMAL HIGH (ref 70–99)
Potassium: 4.2 mmol/L (ref 3.5–5.1)
Sodium: 135 mmol/L (ref 135–145)

## 2019-08-29 LAB — URINALYSIS, ROUTINE W REFLEX MICROSCOPIC
Bilirubin Urine: NEGATIVE
Glucose, UA: NEGATIVE mg/dL
Ketones, ur: 20 mg/dL — AB
Leukocytes,Ua: NEGATIVE
Nitrite: NEGATIVE
Protein, ur: NEGATIVE mg/dL
Specific Gravity, Urine: 1.013 (ref 1.005–1.030)
pH: 6 (ref 5.0–8.0)

## 2019-08-29 LAB — CBC
HCT: 45.6 % (ref 39.0–52.0)
Hemoglobin: 15 g/dL (ref 13.0–17.0)
MCH: 30.9 pg (ref 26.0–34.0)
MCHC: 32.9 g/dL (ref 30.0–36.0)
MCV: 93.8 fL (ref 80.0–100.0)
Platelets: 236 10*3/uL (ref 150–400)
RBC: 4.86 MIL/uL (ref 4.22–5.81)
RDW: 12.6 % (ref 11.5–15.5)
WBC: 18.2 10*3/uL — ABNORMAL HIGH (ref 4.0–10.5)
nRBC: 0 % (ref 0.0–0.2)

## 2019-08-29 LAB — SARS CORONAVIRUS 2 BY RT PCR (HOSPITAL ORDER, PERFORMED IN ~~LOC~~ HOSPITAL LAB): SARS Coronavirus 2: NEGATIVE

## 2019-08-29 MED ORDER — ONDANSETRON HCL 4 MG PO TABS: 4.0000 mg | ORAL_TABLET | Freq: Four times a day (QID) | ORAL | Status: DC | PRN

## 2019-08-29 MED ORDER — MORPHINE SULFATE (PF) 2 MG/ML IV SOLN: 2.0000 mg | INTRAVENOUS | Status: DC | PRN

## 2019-08-29 MED ORDER — BACLOFEN 20 MG PO TABS
30.0000 mg | ORAL_TABLET | Freq: Two times a day (BID) | ORAL | Status: DC
  Administered 2019-08-30 – 2019-09-03 (×10): 30 mg via ORAL
  Filled 2019-08-29: qty 3
  Filled 2019-08-29: qty 1
  Filled 2019-08-29: qty 3
  Filled 2019-08-29: qty 1
  Filled 2019-08-29 (×4): qty 3
  Filled 2019-08-29: qty 1

## 2019-08-29 MED ORDER — ACETAMINOPHEN 325 MG PO TABS: 650.0000 mg | ORAL_TABLET | Freq: Four times a day (QID) | ORAL | Status: DC | PRN

## 2019-08-29 MED ORDER — TRAMADOL HCL 50 MG PO TABS
50.0000 mg | ORAL_TABLET | Freq: Four times a day (QID) | ORAL | Status: DC
  Administered 2019-08-30 – 2019-09-03 (×15): 50 mg via ORAL
  Filled 2019-08-29 (×15): qty 1

## 2019-08-29 MED ORDER — METRONIDAZOLE IN NACL 5-0.79 MG/ML-% IV SOLN
500.0000 mg | Freq: Three times a day (TID) | INTRAVENOUS | Status: DC
  Administered 2019-08-30 – 2019-09-01 (×10): 500 mg via INTRAVENOUS
  Filled 2019-08-29 (×10): qty 100

## 2019-08-29 MED ORDER — TRAMADOL HCL (ER BIPHASIC) 200 MG PO CP24: 1.0000 | ORAL_CAPSULE | Freq: Every day | ORAL | Status: DC

## 2019-08-29 MED ORDER — IOHEXOL 300 MG/ML  SOLN
100.0000 mL | Freq: Once | INTRAMUSCULAR | Status: AC | PRN
  Administered 2019-08-29: 100 mL via INTRAVENOUS

## 2019-08-29 MED ORDER — FLUTICASONE PROPIONATE 50 MCG/ACT NA SUSP: 2.0000 | Freq: Every day | NASAL | Status: DC | PRN

## 2019-08-29 MED ORDER — SODIUM CHLORIDE 0.9 % IV SOLN
1000.0000 mL | INTRAVENOUS | Status: DC
  Administered 2019-08-29: 1000 mL via INTRAVENOUS

## 2019-08-29 MED ORDER — SODIUM CHLORIDE 0.9 % IV SOLN
2.0000 g | INTRAVENOUS | Status: DC
  Administered 2019-08-30 – 2019-09-01 (×4): 2 g via INTRAVENOUS
  Filled 2019-08-29: qty 20
  Filled 2019-08-29: qty 2
  Filled 2019-08-29 (×2): qty 20

## 2019-08-29 MED ORDER — LORATADINE 10 MG PO TABS
10.0000 mg | ORAL_TABLET | Freq: Every day | ORAL | Status: DC
  Administered 2019-08-30 – 2019-09-03 (×5): 10 mg via ORAL
  Filled 2019-08-29 (×5): qty 1

## 2019-08-29 MED ORDER — ONDANSETRON HCL 4 MG/2ML IJ SOLN
4.0000 mg | Freq: Four times a day (QID) | INTRAMUSCULAR | Status: DC | PRN
  Administered 2019-09-01: 4 mg via INTRAVENOUS
  Filled 2019-08-29: qty 2

## 2019-08-29 MED ORDER — SODIUM CHLORIDE 0.9% FLUSH: 3.0000 mL | Freq: Once | INTRAVENOUS | Status: DC

## 2019-08-29 MED ORDER — SODIUM CHLORIDE 0.9 % IV BOLUS (SEPSIS)
1000.0000 mL | Freq: Once | INTRAVENOUS | Status: AC
  Administered 2019-08-29: 1000 mL via INTRAVENOUS

## 2019-08-29 MED ORDER — METRONIDAZOLE IN NACL 5-0.79 MG/ML-% IV SOLN: 500.0000 mg | Freq: Once | INTRAVENOUS | Status: DC

## 2019-08-29 MED ORDER — ADULT MULTIVITAMIN W/MINERALS CH
1.0000 | ORAL_TABLET | Freq: Every day | ORAL | Status: DC
  Administered 2019-08-30 – 2019-09-03 (×5): 1 via ORAL
  Filled 2019-08-29 (×5): qty 1

## 2019-08-29 MED ORDER — VITAMIN D 25 MCG (1000 UNIT) PO TABS
2000.0000 [IU] | ORAL_TABLET | Freq: Every day | ORAL | Status: DC
  Administered 2019-08-30 – 2019-09-03 (×5): 2000 [IU] via ORAL
  Filled 2019-08-29 (×5): qty 2

## 2019-08-29 MED ORDER — SODIUM CHLORIDE 0.9 % IV SOLN: INTRAVENOUS | Status: DC

## 2019-08-29 MED ORDER — CIPROFLOXACIN IN D5W 400 MG/200ML IV SOLN: 400.0000 mg | Freq: Two times a day (BID) | INTRAVENOUS | Status: DC

## 2019-08-29 MED ORDER — CLOTRIMAZOLE-BETAMETHASONE 1-0.05 % EX CREA
1.0000 "application " | TOPICAL_CREAM | Freq: Two times a day (BID) | CUTANEOUS | Status: DC
  Administered 2019-08-30 – 2019-09-02 (×9): 1 via TOPICAL
  Filled 2019-08-29: qty 15

## 2019-08-29 MED ORDER — ACETAMINOPHEN 650 MG RE SUPP: 650.0000 mg | Freq: Four times a day (QID) | RECTAL | Status: DC | PRN

## 2019-08-29 MED ORDER — SODIUM CHLORIDE (PF) 0.9 % IJ SOLN
INTRAMUSCULAR | Status: AC
  Filled 2019-08-29: qty 50

## 2019-08-29 MED ORDER — SACCHAROMYCES BOULARDII 250 MG PO CAPS
250.0000 mg | ORAL_CAPSULE | Freq: Every day | ORAL | Status: DC
  Administered 2019-08-30 – 2019-09-03 (×5): 250 mg via ORAL
  Filled 2019-08-29 (×5): qty 1

## 2019-08-29 MED ORDER — ENOXAPARIN SODIUM 40 MG/0.4ML ~~LOC~~ SOLN
40.0000 mg | SUBCUTANEOUS | Status: DC
  Administered 2019-09-02: 40 mg via SUBCUTANEOUS
  Filled 2019-08-29 (×5): qty 0.4

## 2019-08-29 MED ORDER — DALFAMPRIDINE ER 10 MG PO TB12
10.0000 mg | ORAL_TABLET | Freq: Two times a day (BID) | ORAL | Status: DC
  Administered 2019-09-02: 10 mg via ORAL

## 2019-08-29 MED ORDER — ONDANSETRON 4 MG PO TBDP: 4.0000 mg | ORAL_TABLET | Freq: Three times a day (TID) | ORAL | Status: DC | PRN

## 2019-08-29 MED ORDER — PANTOPRAZOLE SODIUM 40 MG PO TBEC
40.0000 mg | DELAYED_RELEASE_TABLET | Freq: Every day | ORAL | Status: DC
  Administered 2019-08-30 – 2019-09-03 (×5): 40 mg via ORAL
  Filled 2019-08-29 (×5): qty 1

## 2019-08-29 MED ORDER — GABAPENTIN 100 MG PO CAPS
200.0000 mg | ORAL_CAPSULE | Freq: Every day | ORAL | Status: DC
  Administered 2019-08-30 – 2019-09-02 (×5): 200 mg via ORAL
  Filled 2019-08-29 (×5): qty 2

## 2019-08-29 NOTE — ED Notes (Signed)
Patient transported to CT 

## 2019-08-29 NOTE — ED Notes (Signed)
Patient has a urine culture in the main lab 

## 2019-08-29 NOTE — ED Notes (Signed)
Assisted patient to restroom with wheelchair.

## 2019-08-29 NOTE — ED Triage Notes (Signed)
Per EMS: Patient is coming form home with c/o falls and diarrhea. Patient has a hx of MS and has been having loose BM's for months. Patient had an apt with GI on Tuesday. Patient denies injuries or LOC from falls. Vitals WNL

## 2019-08-29 NOTE — ED Provider Notes (Signed)
[facility] DEPT Provider Note   CSN: [id number] Arrival date & time: 08/29/19  8850     History Chief Complaint  Patient presents with  . Weakness  . Diarrhea    [name] is a 64 y.o. male.  HPI   The patient presents to the emergency room with complaints of diarrhea and weakness.  Patient states he has been having diarrhea since April.  Initially it started after a trip to the [place].  He saw his doctor on May 3 and they felt it could be related to a bacterial infection.  He was started on a course of Cipro.  Patient states he seemed to get better shortly after that course of antibiotics but then the diarrhea returned.  He had another visit on May 27 for the same issue.  Patient was given another course of Cipro.  He was also instructed to take align and Lomotil as needed.  Patient felt provement again after the course of antibiotics but after a week or 2 the diarrhea returned.  He did test negative for giardia, crypto and c dif.  Patient states he has multiple episodes of loose stools daily.  Sometimes they are mucousy.  He denies having any fevers.  He is not having abdominal pain.  He has been feeling weak.  Patient has a history of MS and is not sure if the weakness in the weakness is not necessarily new.  He denies any injuries.  Pt has an appointment with GI on Tuesday.  He states he called the office today and was told to come to the ED  Past Medical History:  Diagnosis Date  . Arthritis   . Asthma   . Gallstone pancreatitis   . Hernia   . History of inguinal hernia repair   . Low back pain   . MS (multiple sclerosis) ([place])   . Pancreatitis hosp[italized with gallstone pancreatitis 3/12[  . Vitamin D deficiency     Patient Active Problem List   Diagnosis Date Noted  . Acute bronchitis 02/27/2018  . Influenza A   . Viral gastroenteritis 03/20/2016  . Nausea & vomiting 03/20/2016  . Asthma 03/20/2016  . Leukocytosis 03/20/2016  .  Intractable nausea and vomiting 03/20/2016  . GERD (gastroesophageal reflux disease) 09/25/2014  . Wrist pain, right 09/11/2011  . Left inguinal hernia 02/16/2011  . Paresthesia 01/24/2011  . Well adult exam 07/20/2010  . GALLSTONE PANCREATITIS 04/15/2010  . DIARRHEA, ACUTE 04/15/2010  . FREQUENCY, URINARY 04/15/2010  . Abnormality of gait 04/05/2010  . Headache 04/05/2010  . INSOMNIA, CHRONIC 10/15/2009  . Vitamin D deficiency 06/14/2009  . SEBORRHEIC DERMATITIS 06/14/2009  . LOW BACK PAIN 06/14/2009  . Asthma exacerbation 04/19/2009  . Acute upper respiratory infection 04/13/2009  . Multiple sclerosis ([place]) 05/14/2008  . COUGH 05/14/2008    Past Surgical History:  Procedure Laterality Date  . CHOLECYSTECTOMY  04/2010  . CYSTECTOMY     on back and R hand  . HERNIA REPAIR    . INGUINAL HERNIA REPAIR  03/17/2011   Procedure: HERNIA REPAIR INGUINAL ADULT;  Surgeon: Adin Hector, MD;  Location: [facility];  Service: General;  Laterality: Left;  Open left inguinal hernia repair with mesh  . KNEE ARTHROSCOPY     right  . NOSE SURGERY    . TONSILLECTOMY    . WISDOM TOOTH EXTRACTION         Family History  Problem Relation Age of Onset  . Cancer  Father        ?  . Diabetes Mother     Social History   Tobacco Use  . Smoking status: Never Smoker  . Smokeless tobacco: Never Used  Substance Use Topics  . Alcohol use: No  . Drug use: No    Home Medications Prior to Admission medications   Medication Sig Start Date End Date Taking? Authorizing Provider  amitriptyline (ELAVIL) 25 MG tablet TAKE 2 TABLETS AT BEDTIME AS NEEDED FOR SLEEP (OFFICE VISIT NEEDED BD REFILLS WILL BE GIVEN) Patient taking differently: Take 50 mg by mouth at bedtime.  06/09/19  Yes Plotnikov, Evie Lacks, MD  baclofen (LIORESAL) 20 MG tablet TAKE 1 TABLET FOUR TIMES A DAY AS NEEDED FOR MUSCLE SPASMS (OFFICE VISIT NEEDED BEFORE REFILLS WILL BE GIVEN) Patient taking differently: Take 30  mg by mouth 2 (two) times daily.  02/17/19  Yes Plotnikov, Evie Lacks, MD  cetirizine (ZYRTEC) 10 MG tablet Take 10 mg by mouth daily as needed for allergies.    Yes [provider]  cholecalciferol (VITAMIN D) 1000 units tablet Take 2,000 Units by mouth daily.    Yes [provider]  clotrimazole-betamethasone (LOTRISONE) cream APPLY 1 APPLICATION TOPICALLY TWICE A DAY AS NEEDED FOR IRRITATION Patient taking differently: Apply 1 application topically 2 (two) times daily.  02/17/19  Yes Plotnikov, Evie Lacks, MD  dalfampridine (AMPYRA) 10 MG TB12 Take 1 tablet (10 mg total) by mouth 2 (two) times daily. 09/04/12  Yes Plotnikov, Evie Lacks, MD  diphenoxylate-atropine (LOMOTIL) 2.5-0.025 MG tablet Take 1 tablet by mouth 4 (four) times daily as needed for diarrhea or loose stools. 07/03/19  Yes Plotnikov, Evie Lacks, MD  fluticasone (FLONASE) 50 MCG/ACT nasal spray Place 2 sprays into both nostrils daily as needed for allergies or rhinitis.    Yes [provider]  gabapentin (NEURONTIN) 100 MG capsule TAKE 2 CAPSULES THREE TIMES A DAY AS NEEDED FOR PAIN Patient taking differently: Take 200 mg by mouth at bedtime.  12/30/18  Yes Plotnikov, Evie Lacks, MD  Multiple Vitamin (MULTIVITAMIN WITH MINERALS) TABS tablet Take 1 tablet by mouth daily.   Yes [provider]  ondansetron (ZOFRAN ODT) 4 MG disintegrating tablet Take 1 tablet (4 mg total) by mouth every 8 (eight) hours as needed for nausea or vomiting. 01/31/18  Yes Upstill, Nehemiah Settle, PA-C  pantoprazole (PROTONIX) 40 MG tablet TAKE 1 TABLET DAILY 01/18/19  Yes Plotnikov, Evie Lacks, MD  saccharomyces boulardii (FLORASTOR) 250 MG capsule Take 1 capsule (250 mg total) by mouth 2 (two) times daily. Patient taking differently: Take 250 mg by mouth daily.  07/03/19  Yes Plotnikov, Evie Lacks, MD  TraMADol HCl 200 MG CP24 Take 1 capsule by mouth daily. 07/23/19  Yes Plotnikov, Evie Lacks, MD  ciprofloxacin (CIPRO) 500 MG tablet Take 1  tablet (500 mg total) by mouth 2 (two) times daily. Patient not taking: Reported on 08/29/2019 07/03/19   Plotnikov, Evie Lacks, MD  CONZIP 200 MG CP24 TAKE 1 CAPSULE DAILY Patient not taking: Reported on 08/29/2019 12/31/18   Plotnikov, Evie Lacks, MD  ibuprofen (ADVIL,MOTRIN) 600 MG tablet Take 1 tablet (600 mg total) by mouth every 6 (six) hours as needed. Patient not taking: Reported on 08/29/2019 01/31/18   Charlann Lange, PA-C  ketorolac (TORADOL) 10 MG tablet Take 1 tablet (10 mg total) by mouth daily as needed for severe pain. Patient not taking: Reported on 08/29/2019 01/13/19   Plotnikov, Evie Lacks, MD  metroNIDAZOLE (FLAGYL) 500 MG tablet Take  1 tablet (500 mg total) by mouth 3 (three) times daily. Patient not taking: Reported on 08/29/2019 07/09/19   Plotnikov, Evie Lacks, MD  Pancrelipase, Lip-Prot-Amyl, (ZENPEP) 25000-79000 units CPEP Take 1-2 capsules by mouth 3 (three) times daily before meals. Patient not taking: Reported on 08/29/2019 07/09/19   Plotnikov, Evie Lacks, MD    Allergies    Meperidine and Prednisone  Review of Systems   Review of Systems  All other systems reviewed and are negative.   Physical Exam Updated Vital Signs BP 120/77   Pulse 93   Temp 99.4 F (37.4 C) (Oral)   Resp 17   SpO2 96%   Physical Exam Vitals and nursing note reviewed.  Constitutional:      General: He is not in acute distress.    Appearance: He is well-developed.  HENT:     Head: Normocephalic and atraumatic.     Right Ear: External ear normal.     Left Ear: External ear normal.  Eyes:     General: No scleral icterus.       Right eye: No discharge.        Left eye: No discharge.     Conjunctiva/sclera: Conjunctivae normal.  Neck:     Trachea: No tracheal deviation.  Cardiovascular:     Rate and Rhythm: Normal rate and regular rhythm.  Pulmonary:     Effort: Pulmonary effort is normal. No respiratory distress.     Breath sounds: Normal breath sounds. No stridor. No wheezing or  rales.  Abdominal:     General: Bowel sounds are normal. There is no distension.     Palpations: Abdomen is soft.     Tenderness: There is no abdominal tenderness. There is no guarding or rebound.  Genitourinary:    Comments: No stool in the rectal vault, no fecal impaction Musculoskeletal:        General: No tenderness.     Cervical back: Neck supple.  Skin:    General: Skin is warm and dry.     Findings: No rash.  Neurological:     Mental Status: He is alert.     Cranial Nerves: No cranial nerve deficit (no facial droop, extraocular movements intact, no slurred speech).     Sensory: No sensory deficit.     Motor: Weakness present. No abnormal muscle tone or seizure activity.     ED Results / Procedures / Treatments   Labs (all labs ordered are listed, but only abnormal results are displayed) Labs Reviewed  BASIC METABOLIC PANEL - Abnormal; Notable for the following components:      Result Value   Glucose, Bld 122 (*)    Calcium 8.2 (*)    All other components within normal limits  CBC - Abnormal; Notable for the following components:   WBC 18.2 (*)    All other components within normal limits  URINALYSIS, ROUTINE W REFLEX MICROSCOPIC - Abnormal; Notable for the following components:   Hgb urine dipstick SMALL (*)    Ketones, ur 20 (*)    Bacteria, UA RARE (*)    All other components within normal limits  GASTROINTESTINAL PANEL BY PCR, STOOL (REPLACES STOOL CULTURE)  GIARDIA/CRYPTOSPORIDIUM EIA  SARS CORONAVIRUS 2 BY RT PCR (HOSPITAL ORDER, [facility])  C-REACTIVE PROTEIN  SEDIMENTATION RATE    EKG None  Radiology CT ABDOMEN PELVIS W CONTRAST  Result Date: 08/29/2019 CLINICAL DATA:  Diarrhea for 3 months EXAM: CT ABDOMEN AND PELVIS WITH CONTRAST TECHNIQUE: Multidetector  CT imaging of the abdomen and pelvis was performed using the standard protocol following bolus administration of intravenous contrast. CONTRAST:  150mL OMNIPAQUE IOHEXOL  300 MG/ML  SOLN COMPARISON:  01/31/2018 FINDINGS: Lower chest: No acute pleural or parenchymal lung disease. Hepatobiliary: Gallbladder is surgically absent. Left lobe of the liver is atrophic, a chronic finding. Remainder of the liver is unremarkable. Pancreas: Unremarkable. No pancreatic ductal dilatation or surrounding inflammatory changes. Spleen: Normal in size without focal abnormality. Adrenals/Urinary Tract: Stable right renal cysts. Kidneys otherwise unremarkable. Bladder is moderately distended with no focal abnormality. The adrenals are normal. Stomach/Bowel: No bowel obstruction or ileus. There is mild wall thickening of the descending colon from the splenic flexure through the rectosigmoid junction, with mild pericolonic fat stranding. Findings are consistent with inflammatory or infectious colitis. Vascular/Lymphatic: No significant vascular findings are present. No enlarged abdominal or pelvic lymph nodes. Reproductive: Mild enlargement of the prostate, stable. Other: No abdominal wall hernia or abnormality. No abdominopelvic ascites. Musculoskeletal: No acute or destructive bony lesions. Reconstructed images demonstrate no additional findings. IMPRESSION: 1. Inflammatory or infectious colitis involving the descending colon. 2. Stable mild enlargement of the prostate. Electronically Signed   By: Randa Ngo M.D.   On: 08/29/2019 18:58    Procedures .1-3 Lead EKG Interpretation Performed by: Dorie Rank, MD Authorized by: Dorie Rank, MD     Interpretation: normal     ECG rate:  80   ECG rate assessment: normal     Rhythm: sinus rhythm     Ectopy: none     Conduction: normal     (including critical care time)  Medications Ordered in ED Medications  sodium chloride flush (NS) 0.9 % injection 3 mL (has no administration in time range)  sodium chloride 0.9 % bolus 1,000 mL (0 mLs Intravenous Stopped 08/29/19 1640)    Followed by  0.9 %  sodium chloride infusion (1,000 mLs Intravenous  New Bag/Given (Non-Interop) 08/29/19 1845)  sodium chloride (PF) 0.9 % injection (has no administration in time range)  ciprofloxacin (CIPRO) IVPB 400 mg (has no administration in time range)  metroNIDAZOLE (FLAGYL) IVPB 500 mg (has no administration in time range)  iohexol (OMNIPAQUE) 300 MG/ML solution 100 mL (100 mLs Intravenous Contrast Given 08/29/19 1833)    ED Course  I have reviewed the triage vital signs and the nursing notes.  Pertinent labs & imaging results that were available during my care of the patient were reviewed by me and considered in my medical decision making (see chart for details).  Clinical Course as of Aug 29 2051  Fri Aug 29, 2019  1509 EKG ordered at triage.  Not indicated    [JK]  1512 Nl   Basic metabolic panel(!) [JK]  0277 Wbc elevated  CBC(!) [JK]  1551 Discussed with GI, Amy Esterwood.  Recommends adding on sedrate and crp as well.   [JK]  2049 Discussed CT scan findings with patient.  He has diffuse colitis.   [JK]    Clinical Course User Index [JK] Dorie Rank, MD   MDM Rules/Calculators/A&P                         Patient presented to ED for evaluation of persistent diarrhea and weakness.  Patient's laboratory tests do show an elevated white blood cell count.  Patient CT scan shows a diffuse colitis.  Studies have been sent off.  Patient has failed outpatient treatment.  With his leukocytosis and persistent diarrhea and  diffuse colitis on CT I will start him on IV antibiotics and consult with the medical service for admission.  Final Clinical Impression(s) / ED Diagnoses Final diagnoses:  Colitis     Dorie Rank, MD 08/29/19 2054

## 2019-08-29 NOTE — ED Notes (Signed)
Transport called to take patient to assigned bed.

## 2019-08-30 ENCOUNTER — Other Ambulatory Visit: Payer: Self-pay

## 2019-08-30 LAB — BASIC METABOLIC PANEL
Anion gap: 15 (ref 5–15)
BUN: 9 mg/dL (ref 8–23)
CO2: 22 mmol/L (ref 22–32)
Calcium: 8.2 mg/dL — ABNORMAL LOW (ref 8.9–10.3)
Chloride: 103 mmol/L (ref 98–111)
Creatinine, Ser: 0.79 mg/dL (ref 0.61–1.24)
GFR calc Af Amer: >60 mL/min (ref >60)
GFR calc non Af Amer: >60 mL/min (ref >60)
Glucose, Bld: 77 mg/dL (ref 70–99)
Potassium: 4.1 mmol/L (ref 3.5–5.1)
Sodium: 140 mmol/L (ref 135–145)

## 2019-08-30 LAB — CBC
HCT: 43.1 % (ref 39.0–52.0)
Hemoglobin: 14.1 g/dL (ref 13.0–17.0)
MCH: 30.5 pg (ref 26.0–34.0)
MCHC: 32.7 g/dL (ref 30.0–36.0)
MCV: 93.3 fL (ref 80.0–100.0)
Platelets: 236 10*3/uL (ref 150–400)
RBC: 4.62 MIL/uL (ref 4.22–5.81)
RDW: 12.9 % (ref 11.5–15.5)
WBC: 16.4 10*3/uL — ABNORMAL HIGH (ref 4.0–10.5)
nRBC: 0 % (ref 0.0–0.2)

## 2019-08-30 LAB — GASTROINTESTINAL PANEL BY PCR, STOOL (REPLACES STOOL CULTURE)

## 2019-08-30 LAB — C-REACTIVE PROTEIN: CRP: 19.5 mg/dL — ABNORMAL HIGH (ref <1.0)

## 2019-08-30 LAB — GIARDIA/CRYPTOSPORIDIUM EIA
Cryptosporidium EIA: NEGATIVE
Giardia Ag, Stl: NEGATIVE

## 2019-08-30 LAB — HIV ANTIBODY (ROUTINE TESTING W REFLEX): HIV Screen 4th Generation wRfx: NONREACTIVE

## 2019-08-30 LAB — SEDIMENTATION RATE: Sed Rate: 17 mm/hr — ABNORMAL HIGH (ref 0–16)

## 2019-08-30 NOTE — Progress Notes (Signed)
I have seen and assessed patient and agree with Dr.Mansy's assessment and plan.  Patient 64 year old gentleman history of multiple sclerosis, asthma, pancreatitis, osteoarthritis presented with intermittent diarrhea for the past 3 to 4 months status post 2 courses of antibiotics in the outpatient setting however still with recurrent diarrhea mainly watery with mucus and some occasional blood noted.  CBC done on admission with a leukocytosis, elevated sed rate.  Basic metabolic profile unremarkable.  CRP elevated at 19.5.  Stool studies ordered and currently pending.  CT abdomen and pelvis with concerns for inflammatory infectious colitis involving the descending colon.  Patient place empirically on IV Rocephin and IV Flagyl.  Supportive care.  If stool studies come back negative will consult with GI for further evaluation and management and possible colonoscopy.  No charge.

## 2019-08-30 NOTE — H&P (Signed)
[facility] [place] NAME: [name]    MR#:  [id number]  DATE OF BIRTH:  03/28/1955  DATE OF ADMISSION:  08/29/2019  PRIMARY CARE PHYSICIAN: Plotnikov, Evie Lacks, MD   REQUESTING/REFERRING PHYSICIAN: Dorie Rank, MD CHIEF COMPLAINT:   Chief Complaint  Patient presents with  . Weakness  . Diarrhea    HISTORY OF PRESENT ILLNESS:  [name]  is a 64 y.o. Caucasian male with a known history of multiple sclerosis, asthma, pancreatitis and osteoarthritis, presented to the emergency room with a cancer of intermittent diarrhea over the last couple months for which he was given 2 courses of oral antibiotics on an outpatient basis.  For the last month and he finished his second course he started having recurrent diarrhea which has been mainly watery with mucus and rarely blood.  No melena.  No fever or chills.  He denies any nausea or vomiting.  He has been having crampy abdominal pain.  No chest pain or dyspnea or cough.  No dysuria oliguria or hematuria or flank pain.  Upon position to the emergency room, blood pressure was 137/82 with otherwise normal vital signs.  Labs revealed neck cytosis 18.2 with unremarkable BMP and urinalysis.  Abdominal pelvic CT scan revealed inflammatory or infectious colitis involving the descending colon with mild enlargement of the prostate.  The patient was given IV Cipro and Flagyl as well as 500 mill IV normal saline bolus followed by 125 mL/h.  He will be admitted to a medical bed for further evaluation and management. PAST MEDICAL HISTORY:   Past Medical History:  Diagnosis Date  . Arthritis   . Asthma   . Gallstone pancreatitis   . Hernia   . History of inguinal hernia repair   . Low back pain   . MS (multiple sclerosis) ([place])   . Pancreatitis hosp[italized with gallstone pancreatitis 3/12[  . Vitamin D deficiency   Multiple sclerosis  PAST SURGICAL HISTORY:   Past Surgical History:  Procedure Laterality  Date  . CHOLECYSTECTOMY  04/2010  . CYSTECTOMY     on back and R hand  . HERNIA REPAIR    . INGUINAL HERNIA REPAIR  03/17/2011   Procedure: HERNIA REPAIR INGUINAL ADULT;  Surgeon: Adin Hector, MD;  Location: [facility];  Service: General;  Laterality: Left;  Open left inguinal hernia repair with mesh  . KNEE ARTHROSCOPY     right  . NOSE SURGERY    . TONSILLECTOMY    . WISDOM TOOTH EXTRACTION      SOCIAL HISTORY:   Social History   Tobacco Use  . Smoking status: Never Smoker  . Smokeless tobacco: Never Used  Substance Use Topics  . Alcohol use: No    FAMILY HISTORY:   Family History  Problem Relation Age of Onset  . Cancer Father        ?  . Diabetes Mother     DRUG ALLERGIES:   Allergies  Allergen Reactions  . Meperidine Other (See Comments)    Reaction:  Unknown   . Prednisone Palpitations    REVIEW OF SYSTEMS:   ROS As per history of present illness. All pertinent systems were reviewed above. Constitutional, HEENT, cardiovascular, respiratory, GI, GU, musculoskeletal, neuro, psychiatric, endocrine, integumentary and hematologic systems were reviewed and are otherwise negative/unremarkable except for positive findings mentioned above in the HPI.   MEDICATIONS AT HOME:   Prior to Admission medications   Medication Sig  Start Date End Date Taking? Authorizing Provider  amitriptyline (ELAVIL) 25 MG tablet TAKE 2 TABLETS AT BEDTIME AS NEEDED FOR SLEEP (OFFICE VISIT NEEDED BD REFILLS WILL BE GIVEN) Patient taking differently: Take 50 mg by mouth at bedtime.  06/09/19  Yes Plotnikov, Evie Lacks, MD  baclofen (LIORESAL) 20 MG tablet TAKE 1 TABLET FOUR TIMES A DAY AS NEEDED FOR MUSCLE SPASMS (OFFICE VISIT NEEDED BEFORE REFILLS WILL BE GIVEN) Patient taking differently: Take 30 mg by mouth 2 (two) times daily.  02/17/19  Yes Plotnikov, Evie Lacks, MD  cetirizine (ZYRTEC) 10 MG tablet Take 10 mg by mouth daily as needed for allergies.    Yes [provider]  cholecalciferol (VITAMIN D) 1000 units tablet Take 2,000 Units by mouth daily.    Yes [provider]  clotrimazole-betamethasone (LOTRISONE) cream APPLY 1 APPLICATION TOPICALLY TWICE A DAY AS NEEDED FOR IRRITATION Patient taking differently: Apply 1 application topically 2 (two) times daily.  02/17/19  Yes Plotnikov, Evie Lacks, MD  dalfampridine (AMPYRA) 10 MG TB12 Take 1 tablet (10 mg total) by mouth 2 (two) times daily. 09/04/12  Yes Plotnikov, Evie Lacks, MD  diphenoxylate-atropine (LOMOTIL) 2.5-0.025 MG tablet Take 1 tablet by mouth 4 (four) times daily as needed for diarrhea or loose stools. 07/03/19  Yes Plotnikov, Evie Lacks, MD  fluticasone (FLONASE) 50 MCG/ACT nasal spray Place 2 sprays into both nostrils daily as needed for allergies or rhinitis.    Yes [provider]  gabapentin (NEURONTIN) 100 MG capsule TAKE 2 CAPSULES THREE TIMES A DAY AS NEEDED FOR PAIN Patient taking differently: Take 200 mg by mouth at bedtime.  12/30/18  Yes Plotnikov, Evie Lacks, MD  Multiple Vitamin (MULTIVITAMIN WITH MINERALS) TABS tablet Take 1 tablet by mouth daily.   Yes [provider]  ondansetron (ZOFRAN ODT) 4 MG disintegrating tablet Take 1 tablet (4 mg total) by mouth every 8 (eight) hours as needed for nausea or vomiting. 01/31/18  Yes Upstill, Nehemiah Settle, PA-C  pantoprazole (PROTONIX) 40 MG tablet TAKE 1 TABLET DAILY 01/18/19  Yes Plotnikov, Evie Lacks, MD  saccharomyces boulardii (FLORASTOR) 250 MG capsule Take 1 capsule (250 mg total) by mouth 2 (two) times daily. Patient taking differently: Take 250 mg by mouth daily.  07/03/19  Yes Plotnikov, Evie Lacks, MD  TraMADol HCl 200 MG CP24 Take 1 capsule by mouth daily. 07/23/19  Yes Plotnikov, Evie Lacks, MD  ciprofloxacin (CIPRO) 500 MG tablet Take 1 tablet (500 mg total) by mouth 2 (two) times daily. Patient not taking: Reported on 08/29/2019 07/03/19   Plotnikov, Evie Lacks, MD  CONZIP 200 MG CP24 TAKE 1 CAPSULE  DAILY Patient not taking: Reported on 08/29/2019 12/31/18   Plotnikov, Evie Lacks, MD  ibuprofen (ADVIL,MOTRIN) 600 MG tablet Take 1 tablet (600 mg total) by mouth every 6 (six) hours as needed. Patient not taking: Reported on 08/29/2019 01/31/18   Charlann Lange, PA-C  ketorolac (TORADOL) 10 MG tablet Take 1 tablet (10 mg total) by mouth daily as needed for severe pain. Patient not taking: Reported on 08/29/2019 01/13/19   Plotnikov, Evie Lacks, MD  metroNIDAZOLE (FLAGYL) 500 MG tablet Take 1 tablet (500 mg total) by mouth 3 (three) times daily. Patient not taking: Reported on 08/29/2019 07/09/19   Plotnikov, Evie Lacks, MD  Pancrelipase, Lip-Prot-Amyl, (ZENPEP) 25000-79000 units CPEP Take 1-2 capsules by mouth 3 (three) times daily before meals. Patient not taking: Reported on 08/29/2019 07/09/19   Plotnikov, Evie Lacks, MD      VITAL SIGNS:  Blood pressure 128/75, pulse 88, temperature 98.3 F (36.8 C), temperature source Oral, resp. rate 17, SpO2 96 %.  PHYSICAL EXAMINATION:  Physical Exam  GENERAL:  64 y.o.-year-old Caucasian male patient lying in the bed with no acute distress.  EYES: Pupils equal, round, reactive to light and accommodation. No scleral icterus. Extraocular muscles intact.  HEENT: Head atraumatic, normocephalic. Oropharynx and nasopharynx clear.  NECK:  Supple, no jugular venous distention. No thyroid enlargement, no tenderness.  LUNGS: Normal breath sounds bilaterally, no wheezing, rales,rhonchi or crepitation. No use of accessory muscles of respiration.  CARDIOVASCULAR: Regular rate and rhythm, S1, S2 normal. No murmurs, rubs, or gallops.  ABDOMEN: Soft, nondistended, with mild lower abdominal tenderness mainly in the left lower quadrant without rebound tenderness guarding or rigidity.. Bowel sounds present. No organomegaly or mass.  EXTREMITIES: No pedal edema, cyanosis, or clubbing.  NEUROLOGIC: Cranial nerves II through XII are intact. Muscle strength 5/5 in all extremities.  Sensation intact. Gait not checked.  PSYCHIATRIC: The patient is alert and oriented x 3.  Normal affect and good eye contact. SKIN: No obvious rash, lesion, or ulcer.   LABORATORY PANEL:   CBC Recent Labs  Lab 08/29/19 1151  WBC 18.2*  HGB 15.0  HCT 45.6  PLT 236   ------------------------------------------------------------------------------------------------------------------  Chemistries  Recent Labs  Lab 08/29/19 1151  NA 135  K 4.2  CL 99  CO2 27  GLUCOSE 122*  BUN 11  CREATININE 0.74  CALCIUM 8.2*   ------------------------------------------------------------------------------------------------------------------  Cardiac Enzymes No results for input(s): TROPONINI in the last 168 hours. ------------------------------------------------------------------------------------------------------------------  RADIOLOGY:  CT ABDOMEN PELVIS W CONTRAST  Result Date: 08/29/2019 CLINICAL DATA:  Diarrhea for 3 months EXAM: CT ABDOMEN AND PELVIS WITH CONTRAST TECHNIQUE: Multidetector CT imaging of the abdomen and pelvis was performed using the standard protocol following bolus administration of intravenous contrast. CONTRAST:  115mL OMNIPAQUE IOHEXOL 300 MG/ML  SOLN COMPARISON:  01/31/2018 FINDINGS: Lower chest: No acute pleural or parenchymal lung disease. Hepatobiliary: Gallbladder is surgically absent. Left lobe of the liver is atrophic, a chronic finding. Remainder of the liver is unremarkable. Pancreas: Unremarkable. No pancreatic ductal dilatation or surrounding inflammatory changes. Spleen: Normal in size without focal abnormality. Adrenals/Urinary Tract: Stable right renal cysts. Kidneys otherwise unremarkable. Bladder is moderately distended with no focal abnormality. The adrenals are normal. Stomach/Bowel: No bowel obstruction or ileus. There is mild wall thickening of the descending colon from the splenic flexure through the rectosigmoid junction, with mild pericolonic fat  stranding. Findings are consistent with inflammatory or infectious colitis. Vascular/Lymphatic: No significant vascular findings are present. No enlarged abdominal or pelvic lymph nodes. Reproductive: Mild enlargement of the prostate, stable. Other: No abdominal wall hernia or abnormality. No abdominopelvic ascites. Musculoskeletal: No acute or destructive bony lesions. Reconstructed images demonstrate no additional findings. IMPRESSION: 1. Inflammatory or infectious colitis involving the descending colon. 2. Stable mild enlargement of the prostate. Electronically Signed   By: Randa Ngo M.D.   On: 08/29/2019 18:58      IMPRESSION AND PLAN:   1.  Acute colitis with subsequent mild sepsis and without severe sepsis or septic shock.  This manifested by significant leukocytosis of 18 and heart rate of 92.   -The patient was admitted to a medical bed. -We will continue antibiotic therapy with IV Rocephin and Flagyl. -Pain management will be provided. -Stool studies will be obtained. -Hydration will be given with IV normal saline.  2.  Multiple sclerosis. -We will continue his Ampyra.  3.  History  of chronic pancreatitis. -We will continue Zenpep and place on as needed tramadol to replace Conzip.  4.  GERD. -Continue Protonix.  5.  Peripheral neuropathy. -We will continue Neurontin.  6.  DVT prophylaxis. The subcutaneous Lovenox.   All the records are reviewed and case discussed with ED provider. The plan of care was discussed in details with the patient (and family). I answered all questions. The patient agreed to proceed with the above mentioned plan. Further management will depend upon hospital course.   CODE STATUS: Full code  Status is: Inpatient  Remains inpatient appropriate because:Altered mental status, Ongoing diagnostic testing needed not appropriate for outpatient work up, Unsafe d/c plan, IV treatments appropriate due to intensity of illness or inability to take PO  and Inpatient level of care appropriate due to severity of illness   Dispo: The patient is from: Home              Anticipated d/c is to: Home              Anticipated d/c date is: 2 days              Patient currently is not medically stable to d/c.    TOTAL TIME TAKING CARE OF THIS PATIENT: 55 minutes.    Christel Mormon M.D on 08/30/2019 at 4:13 AM  [facility]   From 7 PM-7 AM, contact night-coverage [email or web address]  CC: Primary care physician; Plotnikov, Evie Lacks, MD   Note: This dictation was prepared with Dragon dictation along with smaller phrase technology. Any transcriptional typo errors that result from this process are unintentional.

## 2019-08-31 DIAGNOSIS — R202 Paresthesia of skin: Secondary | ICD-10-CM

## 2019-08-31 DIAGNOSIS — K861 Other chronic pancreatitis: Secondary | ICD-10-CM

## 2019-08-31 DIAGNOSIS — G629 Polyneuropathy, unspecified: Secondary | ICD-10-CM

## 2019-08-31 LAB — CBC WITH DIFFERENTIAL/PLATELET
Abs Immature Granulocytes: 0.17 10*3/uL — ABNORMAL HIGH (ref 0.00–0.07)
Basophils Absolute: 0.1 10*3/uL (ref 0.0–0.1)
Basophils Relative: 0 %
Eosinophils Absolute: 0 10*3/uL (ref 0.0–0.5)
Eosinophils Relative: 0 %
HCT: 37.6 % — ABNORMAL LOW (ref 39.0–52.0)
Hemoglobin: 12.8 g/dL — ABNORMAL LOW (ref 13.0–17.0)
Immature Granulocytes: 1 %
Lymphocytes Relative: 2 %
Lymphs Abs: 0.5 10*3/uL — ABNORMAL LOW (ref 0.7–4.0)
MCH: 31 pg (ref 26.0–34.0)
MCHC: 34 g/dL (ref 30.0–36.0)
MCV: 91 fL (ref 80.0–100.0)
Monocytes Absolute: 2.3 10*3/uL — ABNORMAL HIGH (ref 0.1–1.0)
Monocytes Relative: 11 %
Neutro Abs: 19.2 10*3/uL — ABNORMAL HIGH (ref 1.7–7.7)
Neutrophils Relative %: 86 %
Platelets: 224 10*3/uL (ref 150–400)
RBC: 4.13 MIL/uL — ABNORMAL LOW (ref 4.22–5.81)
RDW: 12.8 % (ref 11.5–15.5)
WBC: 22.3 10*3/uL — ABNORMAL HIGH (ref 4.0–10.5)
nRBC: 0 % (ref 0.0–0.2)

## 2019-08-31 LAB — BASIC METABOLIC PANEL
Anion gap: 11 (ref 5–15)
BUN: <5 mg/dL — ABNORMAL LOW (ref 8–23)
CO2: 22 mmol/L (ref 22–32)
Calcium: 7.7 mg/dL — ABNORMAL LOW (ref 8.9–10.3)
Chloride: 103 mmol/L (ref 98–111)
Creatinine, Ser: 0.64 mg/dL (ref 0.61–1.24)
GFR calc Af Amer: >60 mL/min (ref >60)
GFR calc non Af Amer: >60 mL/min (ref >60)
Glucose, Bld: 139 mg/dL — ABNORMAL HIGH (ref 70–99)
Potassium: 3.4 mmol/L — ABNORMAL LOW (ref 3.5–5.1)
Sodium: 136 mmol/L (ref 135–145)

## 2019-08-31 LAB — MAGNESIUM: Magnesium: 1.8 mg/dL (ref 1.7–2.4)

## 2019-08-31 MED ORDER — BOOST / RESOURCE BREEZE PO LIQD CUSTOM
1.0000 | Freq: Three times a day (TID) | ORAL | Status: DC
  Administered 2019-08-31 – 2019-09-01 (×2): 1 via ORAL

## 2019-08-31 MED ORDER — PANCRELIPASE (LIP-PROT-AMYL) 12000-38000 UNITS PO CPEP
24000.0000 [IU] | ORAL_CAPSULE | Freq: Three times a day (TID) | ORAL | Status: DC
  Administered 2019-09-01 – 2019-09-02 (×4): 24000 [IU] via ORAL
  Filled 2019-08-31 (×4): qty 2

## 2019-08-31 MED ORDER — POTASSIUM CHLORIDE CRYS ER 20 MEQ PO TBCR
40.0000 meq | EXTENDED_RELEASE_TABLET | Freq: Once | ORAL | Status: AC
  Administered 2019-08-31: 40 meq via ORAL
  Filled 2019-08-31: qty 2

## 2019-08-31 MED ORDER — MAGNESIUM SULFATE 2 GM/50ML IV SOLN
2.0000 g | Freq: Once | INTRAVENOUS | Status: AC
  Administered 2019-08-31: 2 g via INTRAVENOUS
  Filled 2019-08-31: qty 50

## 2019-08-31 NOTE — Progress Notes (Signed)
PROGRESS NOTE    [name]  TKZ:[id number] DOB: 07-10-1955 DOA: 08/29/2019 PCP: Cassandria Anger, MD    Chief Complaint  Patient presents with  . Weakness  . Diarrhea    Brief Narrative: HPI per Dr. Felecia Shelling Spano  is a 64 y.o. Caucasian male with a known history of multiple sclerosis, asthma, pancreatitis and osteoarthritis, presented to the emergency room with a cancer of intermittent diarrhea over the last couple months for which he was given 2 courses of oral antibiotics on an outpatient basis.  For the last month and he finished his second course he started having recurrent diarrhea which has been mainly watery with mucus and rarely blood.  No melena.  No fever or chills.  He denies any nausea or vomiting.  He has been having crampy abdominal pain.  No chest pain or dyspnea or cough.  No dysuria oliguria or hematuria or flank pain.  Upon position to the emergency room, blood pressure was 137/82 with otherwise normal vital signs.  Labs revealed neck cytosis 18.2 with unremarkable BMP and urinalysis.  Abdominal pelvic CT scan revealed inflammatory or infectious colitis involving the descending colon with mild enlargement of the prostate.  The patient was given IV Cipro and Flagyl as well as 500 mill IV normal saline bolus followed by 125 mL/h.  He will be admitted to a medical bed for further evaluation and management.  Assessment & Plan:   Principal Problem:   Acute colitis Active Problems:   Multiple sclerosis (HCC)   Paresthesia   GERD (gastroesophageal reflux disease)   Peripheral neuropathy   Chronic pancreatitis ([facility]CC)  1 acute colitis with subsequent mild sepsis without severe sepsis or septic shock Patient met criteria for mild sepsis with a leukocytosis, low-grade fever.  CT abdomen and pelvis which was done concerning for inflammatory or infectious colitis involving the descending colon.  Stable mild enlargement of the prostate.  Stool studies obtained and  C. difficile PCR pending.  Giardia negative, cryptosporidium negative, GI pathogen panel negative.  Patient still with a significant leukocytosis with white count trending back up.  Patient with some improvement with consistency in his stools.  Continue IV fluids.  Tolerating clears will advance to full liquid diet.  Will consult with GI tomorrow for further evaluation and management pending C. difficile PCR results.  Patient would likely benefit from a colonoscopy to rule out inflammatory colitis.  Follow.  2.  Dehydration IV fluids.  3.  Multiple sclerosis Stable.  Continue baclofen, dalfampridine.  4.  History of chronic pancreatitis Resume home regimen Zenpep.  Tramadol as needed.  5.  Gastroesophageal reflux disease PPI.  6.  Peripheral neuropathy Neurontin.   DVT prophylaxis: Lovenox Code Status: Full Family Communication: Updated patient and wife at bedside. Disposition:   Status is: Inpatient    Dispo: The patient is from: Home              Anticipated d/c is to: Home              Anticipated d/c date is: 2 to 3 days              Patient currently on IV antibiotics, stool studies pending, will likely need GI involvement for further evaluation.  Not stable for discharge.       Consultants:   None  Procedures:   CT abdomen and pelvis 08/29/2019  Antimicrobials:   IV Rocephin 08/29/2019>>>>  IV Flagyl 08/29/2019>>>>>   Subjective: Patient sitting on commode.  Patient states some improvement with diarrhea which is becoming a little bit more formed with more consistency.  Patient states improvement with mucus and blood in stools.  Denies any chest pain or shortness of breath.  Patient states whenever he is placed on antibiotics stools improve and then when he is off antibiotics he has recurrence of his symptoms.  Wife at bedside and concerned.    Objective: Vitals:   08/30/19 1313 08/30/19 2218 08/31/19 0527 08/31/19 1436  BP: (!) 130/84 127/85 113/68 (!)  135/83  Pulse: 102 103 90 93  Resp: _0 Temp: 98.8 F (37.1 C) 100.1 F (37.8 C) 99.2 F (37.3 C) 98.9 F (37.2 C)  TempSrc: Oral Oral Oral Oral  SpO2: 98% 95% 92% 98%   No intake or output data in the 24 hours ending 08/31/19 1907 There were no vitals filed for this visit.  Examination:  General exam: Appears calm and comfortable  Respiratory system: Clear to auscultation. Respiratory effort normal. Cardiovascular system: S1 & S2 heard, RRR. No JVD, murmurs, rubs, gallops or clicks. No pedal edema. Gastrointestinal system: Abdomen is nondistended, soft and nontender. No organomegaly or masses felt. Normal bowel sounds heard. Central nervous system: Alert and oriented. No focal neurological deficits. Extremities: Symmetric 5 x 5 power. Skin: No rashes, lesions or ulcers Psychiatry: Judgement and insight appear normal. Mood & affect appropriate.     Data Reviewed: I have personally reviewed following labs and imaging studies  CBC: Recent Labs  Lab 08/29/19 1151 08/30/19 0631 08/31/19 0852  WBC 18.2* 16.4* 22.3*  NEUTROABS  --   --  19.2*  HGB 15.0 14.1 12.8*  HCT 45.6 43.1 37.6*  MCV 93.8 93.3 91.0  PLT 236 236 500    Basic Metabolic Panel: Recent Labs  Lab 08/29/19 1151 08/30/19 0631 08/31/19 0852  NA 135 140 136  K 4.2 4.1 3.4*  CL 99 103 103  CO2 _1 GLUCOSE 122* 77 139*  BUN 11 9 <5*  CREATININE 0.74 0.79 0.64  CALCIUM 8.2* 8.2* 7.7*  MG  --   --  1.8    GFR: CrCl cannot be calculated (Unknown ideal weight.).  Liver Function Tests: No results for input(s): AST, ALT, ALKPHOS, BILITOT, PROT, ALBUMIN in the last 168 hours.  CBG: No results for input(s): GLUCAP in the last 168 hours.   Recent Results (from the past 240 hour(s))  Gastrointestinal Panel by PCR , Stool     Status: None   Collection Time: 08/29/19  2:59 PM   Specimen: Stool  Result Value Ref Range Status   Campylobacter species NOT DETECTED NOT DETECTED Final    Plesimonas shigelloides NOT DETECTED NOT DETECTED Final   Salmonella species NOT DETECTED NOT DETECTED Final   Yersinia enterocolitica NOT DETECTED NOT DETECTED Final   Vibrio species NOT DETECTED NOT DETECTED Final   Vibrio cholerae NOT DETECTED NOT DETECTED Final   Enteroaggregative E coli (EAEC) NOT DETECTED NOT DETECTED Final   Enteropathogenic E coli (EPEC) NOT DETECTED NOT DETECTED Final   Enterotoxigenic E coli (ETEC) NOT DETECTED NOT DETECTED Final   Shiga like toxin producing E coli (STEC) NOT DETECTED NOT DETECTED Final   Shigella/Enteroinvasive E coli (EIEC) NOT DETECTED NOT DETECTED Final   Cryptosporidium NOT DETECTED NOT DETECTED Final   Cyclospora cayetanensis NOT DETECTED NOT DETECTED Final   Entamoeba histolytica NOT DETECTED NOT DETECTED Final   Giardia lamblia NOT DETECTED NOT DETECTED Final   Adenovirus F40/41 NOT DETECTED  NOT DETECTED Final   Astrovirus NOT DETECTED NOT DETECTED Final   Norovirus GI/GII NOT DETECTED NOT DETECTED Final   Rotavirus A NOT DETECTED NOT DETECTED Final   Sapovirus (I, II, IV, and V) NOT DETECTED NOT DETECTED Final    Comment: Performed at [facility], [address] [place], [place] [address]  Giardia/Cryptosporidium EIA     Status: None   Collection Time: 08/29/19  6:05 PM   Specimen: Stool  Result Value Ref Range Status   Giardia Ag, Stl Negative Negative Final   Cryptosporidium EIA Negative Negative Final    Comment: (NOTE) Performed At: [facility] [place], [place] [id number] Rush Farmer MD IW:[id number]    Source of Sample STOOL  Final    Comment: Performed at [facility], [place] [address], [place], [place] [address]  SARS Coronavirus 2 by RT PCR (hospital order, performed in [facility] hospital lab) Nasopharyngeal Nasopharyngeal Swab     Status: None   Collection Time: 08/29/19  8:54 PM   Specimen: Nasopharyngeal Swab  Result Value Ref Range Status   SARS Coronavirus 2  NEGATIVE NEGATIVE Final    Comment: (NOTE) SARS-CoV-2 target nucleic acids are NOT DETECTED.  The SARS-CoV-2 RNA is generally detectable in upper and lower respiratory specimens during the acute phase of infection. The lowest concentration of SARS-CoV-2 viral copies this assay can detect is 250 copies / mL. A negative result does not preclude SARS-CoV-2 infection and should not be used as the sole basis for treatment or other patient management decisions.  A negative result may occur with improper specimen collection / handling, submission of specimen other than nasopharyngeal swab, presence of viral mutation(s) within the areas targeted by this assay, and inadequate number of viral copies (<250 copies / mL). A negative result must be combined with clinical observations, patient history, and epidemiological information.  Fact Sheet for Patients:   StrictlyIdeas.no  Fact Sheet for Healthcare Providers: BankingDealers.co.za  This test is not yet approved or  cleared by the [place] FDA and has been authorized for detection and/or diagnosis of SARS-CoV-2 by FDA under an Emergency Use Authorization (EUA).  This EUA will remain in effect (meaning this test can be used) for the duration of the COVID-19 declaration under Section 564(b)(1) of the Act, 21 U.S.C. section 360bbb-3(b)(1), unless the authorization is terminated or revoked sooner.  Performed at [facility], [place] [address], [place], [place] [address]          Radiology Studies: No results found.      Scheduled Meds: . baclofen  30 mg Oral BID  . cholecalciferol  2,000 Units Oral Daily  . clotrimazole-betamethasone  1 application Topical BID  . dalfampridine  10 mg Oral BID  . enoxaparin (LOVENOX) injection  40 mg Subcutaneous Q24H  . feeding supplement  1 Container Oral TID BM  . gabapentin  200 mg Oral QHS  . loratadine  10 mg Oral Daily   . multivitamin with minerals  1 tablet Oral Daily  . pantoprazole  40 mg Oral Daily  . saccharomyces boulardii  250 mg Oral Daily  . sodium chloride flush  3 mL Intravenous Once  . traMADol  50 mg Oral Q6H   Continuous Infusions: . cefTRIAXone (ROCEPHIN)  IV 2 g (08/30/19 2209)  . metronidazole 500 mg (08/31/19 1736)     LOS: 2 days    Time spent: 35 minutes    Irine Seal, MD [facility]  To contact the attending provider between 7A-7P or the covering provider during after hours 7P-7A, please log into the web site [email or web address] and access using universal [place] password for that web site. If you do not have the password, please call the hospital operator.  08/31/2019, 7:07 PM

## 2019-08-31 NOTE — Progress Notes (Signed)
Nutrition Brief Note  Patient identified on the Malnutrition Screening Tool (MST) Report  Wt Readings from Last 15 Encounters:  06/05/19 75.3 kg  12/03/18 77.1 kg  02/27/18 78 kg  11/14/17 74.4 kg  05/16/17 74.4 kg  11/15/16 76.2 kg  07/18/16 74.8 kg  03/29/16 70.1 kg  03/20/16 69.2 kg  09/27/15 73.9 kg  03/29/15 72.6 kg  09/25/14 70.8 kg  03/27/14 78.9 kg  09/24/13 74.4 kg  03/24/13 71.7 kg   There is no height or weight on file to calculate BMI.   Pt reports that he has lost 10-12 lbs over a 73-month time span. Pt reports that he was last weighed at the doctor's office 3 weeks ago and he weighed 70.9kg. This would indicate a 5.8% wt loss x3 months, which is insignificant for time frame.   Current diet order is clear liquid. There is no PO intake documented, but pt reports that his appetite has not been affected. Pt states that he was eating 3 balanced meals a day PTA.   Labs: K+ 3.4 (L) Medications: Vitamin D3, MVI, Protonix, Florastor, IV abx  RD will order Boost Breeze TID to increase protein/calorie intake while pt is on a clear liquid diet; however, no additional nutrition interventions warranted at this time. If nutrition issues arise, please consult RD.   Larkin Ina, MS, RD, LDN RD pager number and weekend/on-call pager number located in [place].

## 2019-09-01 DIAGNOSIS — K529 Noninfective gastroenteritis and colitis, unspecified: Secondary | ICD-10-CM

## 2019-09-01 LAB — CBC WITH DIFFERENTIAL/PLATELET
Abs Immature Granulocytes: 0.18 10*3/uL — ABNORMAL HIGH (ref 0.00–0.07)
Basophils Absolute: 0.1 10*3/uL (ref 0.0–0.1)
Basophils Relative: 0 %
Eosinophils Absolute: 0.1 10*3/uL (ref 0.0–0.5)
Eosinophils Relative: 1 %
HCT: 38.7 % — ABNORMAL LOW (ref 39.0–52.0)
Hemoglobin: 13 g/dL (ref 13.0–17.0)
Immature Granulocytes: 1 %
Lymphocytes Relative: 3 %
Lymphs Abs: 0.6 10*3/uL — ABNORMAL LOW (ref 0.7–4.0)
MCH: 30.5 pg (ref 26.0–34.0)
MCHC: 33.6 g/dL (ref 30.0–36.0)
MCV: 90.8 fL (ref 80.0–100.0)
Monocytes Absolute: 1.9 10*3/uL — ABNORMAL HIGH (ref 0.1–1.0)
Monocytes Relative: 9 %
Neutro Abs: 18 10*3/uL — ABNORMAL HIGH (ref 1.7–7.7)
Neutrophils Relative %: 86 %
Platelets: 241 10*3/uL (ref 150–400)
RBC: 4.26 MIL/uL (ref 4.22–5.81)
RDW: 13.1 % (ref 11.5–15.5)
WBC: 20.9 10*3/uL — ABNORMAL HIGH (ref 4.0–10.5)
nRBC: 0 % (ref 0.0–0.2)

## 2019-09-01 LAB — BASIC METABOLIC PANEL
Anion gap: 11 (ref 5–15)
BUN: <5 mg/dL — ABNORMAL LOW (ref 8–23)
CO2: 23 mmol/L (ref 22–32)
Calcium: 7.7 mg/dL — ABNORMAL LOW (ref 8.9–10.3)
Chloride: 101 mmol/L (ref 98–111)
Creatinine, Ser: 0.6 mg/dL — ABNORMAL LOW (ref 0.61–1.24)
GFR calc Af Amer: >60 mL/min (ref >60)
GFR calc non Af Amer: >60 mL/min (ref >60)
Glucose, Bld: 104 mg/dL — ABNORMAL HIGH (ref 70–99)
Potassium: 3.5 mmol/L (ref 3.5–5.1)
Sodium: 135 mmol/L (ref 135–145)

## 2019-09-01 LAB — MAGNESIUM: Magnesium: 1.9 mg/dL (ref 1.7–2.4)

## 2019-09-01 MED ORDER — PEG-KCL-NACL-NASULF-NA ASC-C 100 G PO SOLR
0.5000 | Freq: Once | ORAL | Status: AC
  Administered 2019-09-02: 100 g via ORAL

## 2019-09-01 MED ORDER — PEG-KCL-NACL-NASULF-NA ASC-C 100 G PO SOLR: 1.0000 | Freq: Once | ORAL | Status: DC

## 2019-09-01 MED ORDER — POTASSIUM CHLORIDE CRYS ER 20 MEQ PO TBCR
40.0000 meq | EXTENDED_RELEASE_TABLET | Freq: Once | ORAL | Status: AC
  Administered 2019-09-01: 40 meq via ORAL
  Filled 2019-09-01: qty 2

## 2019-09-01 MED ORDER — PEG-KCL-NACL-NASULF-NA ASC-C 100 G PO SOLR
0.5000 | Freq: Once | ORAL | Status: AC
  Administered 2019-09-01: 100 g via ORAL
  Filled 2019-09-01: qty 1

## 2019-09-01 NOTE — Progress Notes (Signed)
PROGRESS NOTE    [name]  DJT:[id number] DOB: February 08, 1955 DOA: 08/29/2019 PCP: Cassandria Anger, MD    Chief Complaint  Patient presents with  . Weakness  . Diarrhea    Brief Narrative: HPI per Dr. [name]  is a 64 y.o. Caucasian male with a known history of multiple sclerosis, asthma, pancreatitis and osteoarthritis, presented to the emergency room with a cancer of intermittent diarrhea over the last couple months for which he was given 2 courses of oral antibiotics on an outpatient basis.  For the last month and he finished his second course he started having recurrent diarrhea which has been mainly watery with mucus and rarely blood.  No melena.  No fever or chills.  He denies any nausea or vomiting.  He has been having crampy abdominal pain.  No chest pain or dyspnea or cough.  No dysuria oliguria or hematuria or flank pain.  Upon position to the emergency room, blood pressure was 137/82 with otherwise normal vital signs.  Labs revealed neck cytosis 18.2 with unremarkable BMP and urinalysis.  Abdominal pelvic CT scan revealed inflammatory or infectious colitis involving the descending colon with mild enlargement of the prostate.  The patient was given IV Cipro and Flagyl as well as 500 mill IV normal saline bolus followed by 125 mL/h.  He will be admitted to a medical bed for further evaluation and management.  Assessment & Plan:   Principal Problem:   Acute colitis Active Problems:   Multiple sclerosis (HCC)   Paresthesia   GERD (gastroesophageal reflux disease)   Peripheral neuropathy   Chronic pancreatitis (HCC)  1 acute colitis with subsequent mild sepsis without severe sepsis or septic shock Patient met criteria for mild sepsis with a leukocytosis, low-grade fever.  CT abdomen and pelvis which was done concerning for inflammatory or infectious colitis involving the descending colon.  Stable mild enlargement of the prostate.  Stool studies obtained and  C. difficile PCR are obtained, patient now with formed stools. Giardia negative, cryptosporidium negative, GI pathogen panel negative.  Patient still with a significant leukocytosis with white count 20.9.  Improved clinically. Continue IV fluids.  Diet has been changed back to clears.  Patient noted to have negative C. difficile test on 07/14/2019, GI pathogen panel during this hospitalization negative, Giardia negative, unable to obtain C. difficile as stools formed during this hospitalization.  Patient has received 2 rounds of antibiotics with transient improvement as well as some improvement on a short course of pancreatic enzymes.  Patient currently on IV antibiotics.  Concern for IBD.  Patient seen in consultation by GI and patient for colonoscopy tomorrow for further evaluation.  Appreciate GI input and recommendations.   2.  Dehydration IV fluids.  3.  Multiple sclerosis Stable.  Continue baclofen, dalfampridine.  4.  Gastroesophageal reflux disease Continue PPI.  5.  Peripheral neuropathy Continue Neurontin.   DVT prophylaxis: Lovenox Code Status: Full Family Communication: Updated patient and wife at bedside. Disposition:   Status is: Inpatient    Dispo: The patient is from: Home              Anticipated d/c is to: Home              Anticipated d/c date is: 2 to 3 days              Patient currently on IV antibiotics, patient being assessed by GI and patient for colonoscopy tomorrow.  Not stable for discharge.  Consultants:   Gastroenterology: Dr. Ardis Hughs 09/01/2019  Procedures:   CT abdomen and pelvis 08/29/2019  Antimicrobials:   IV Rocephin 08/29/2019>>>>  IV Flagyl 08/29/2019>>>>>   Subjective: Patient standing up in the room.  Feels boost is making him go to the bathroom.  Denies any chest pain.  No shortness of breath.  States diarrhea has improved and stool is more formed.  Denies any significant abdominal pain.  Patient denies any history of chronic  pancreatitis and stated was placed on pancreatic enzymes for short course.  Patient changed to clear liquids this morning.  Objective: Vitals:   08/31/19 0527 08/31/19 1436 08/31/19 2104 09/01/19 0508  BP: 113/68 (!) 135/83 121/72 121/82  Pulse: 90 93 94 92  Resp: '18 15 17 16  ' Temp: 99.2 F (37.3 C) 98.9 F (37.2 C) 100.3 F (37.9 C) 99.4 F (37.4 C)  TempSrc: Oral Oral Oral Oral  SpO2: 92% 98% 96% 98%    Intake/Output Summary (Last 24 hours) at 09/01/2019 1312 Last data filed at 09/01/2019 0600 Gross per 24 hour  Intake 540 ml  Output 200 ml  Net 340 ml   There were no vitals filed for this visit.  Examination:  General exam: NAD Respiratory system: CTA B.  No wheezes, no crackles, no rhonchi.  Normal respiratory effort.   Cardiovascular system: Regular rate rhythm no murmurs rubs or gallops.  No JVD.  No lower extremity edema.  Gastrointestinal system: Abdomen is soft, nontender, nondistended, positive bowel sounds.  No rebound.  No guarding.  Central nervous system: Alert and oriented. No focal neurological deficits. Extremities: Symmetric 5 x 5 power. Skin: No rashes, lesions or ulcers Psychiatry: Judgement and insight appear normal. Mood & affect appropriate.     Data Reviewed: I have personally reviewed following labs and imaging studies  CBC: Recent Labs  Lab 08/29/19 1151 08/30/19 0631 08/31/19 0852 09/01/19 0655  WBC 18.2* 16.4* 22.3* 20.9*  NEUTROABS  --   --  19.2* 18.0*  HGB 15.0 14.1 12.8* 13.0  HCT 45.6 43.1 37.6* 38.7*  MCV 93.8 93.3 91.0 90.8  PLT 236 236 224 038    Basic Metabolic Panel: Recent Labs  Lab 08/29/19 1151 08/30/19 0631 08/31/19 0852 09/01/19 0655  NA 135 140 136 135  K 4.2 4.1 3.4* 3.5  CL 99 103 103 101  CO2 '27 22 22 23  ' GLUCOSE 122* 77 139* 104*  BUN 11 9 <5* <5*  CREATININE 0.74 0.79 0.64 0.60*  CALCIUM 8.2* 8.2* 7.7* 7.7*  MG  --   --  1.8 1.9    GFR: CrCl cannot be calculated (Unknown ideal weight.).  Liver  Function Tests: No results for input(s): AST, ALT, ALKPHOS, BILITOT, PROT, ALBUMIN in the last 168 hours.  CBG: No results for input(s): GLUCAP in the last 168 hours.   Recent Results (from the past 240 hour(s))  Gastrointestinal Panel by PCR , Stool     Status: None   Collection Time: 08/29/19  2:59 PM   Specimen: Stool  Result Value Ref Range Status   Campylobacter species NOT DETECTED NOT DETECTED Final   Plesimonas shigelloides NOT DETECTED NOT DETECTED Final   Salmonella species NOT DETECTED NOT DETECTED Final   Yersinia enterocolitica NOT DETECTED NOT DETECTED Final   Vibrio species NOT DETECTED NOT DETECTED Final   Vibrio cholerae NOT DETECTED NOT DETECTED Final   Enteroaggregative E coli (EAEC) NOT DETECTED NOT DETECTED Final   Enteropathogenic E coli (EPEC) NOT DETECTED NOT DETECTED Final  Enterotoxigenic E coli (ETEC) NOT DETECTED NOT DETECTED Final   Shiga like toxin producing E coli (STEC) NOT DETECTED NOT DETECTED Final   Shigella/Enteroinvasive E coli (EIEC) NOT DETECTED NOT DETECTED Final   Cryptosporidium NOT DETECTED NOT DETECTED Final   Cyclospora cayetanensis NOT DETECTED NOT DETECTED Final   Entamoeba histolytica NOT DETECTED NOT DETECTED Final   Giardia lamblia NOT DETECTED NOT DETECTED Final   Adenovirus F40/41 NOT DETECTED NOT DETECTED Final   Astrovirus NOT DETECTED NOT DETECTED Final   Norovirus GI/GII NOT DETECTED NOT DETECTED Final   Rotavirus A NOT DETECTED NOT DETECTED Final   Sapovirus (I, II, IV, and V) NOT DETECTED NOT DETECTED Final    Comment: Performed at [facility], [address] [place], [place] [address]  Giardia/Cryptosporidium EIA     Status: None   Collection Time: 08/29/19  6:05 PM   Specimen: Stool  Result Value Ref Range Status   Giardia Ag, Stl Negative Negative Final   Cryptosporidium EIA Negative Negative Final    Comment: (NOTE) Performed At: [facility] [place], [place] [id number] Rush Farmer MD [id number]    Source of Sample STOOL  Final    Comment: Performed at [facility], [place] [address] [place], [place] [address]  SARS Coronavirus 2 by RT PCR (hospital order, performed in [facility] hospital lab) Nasopharyngeal Nasopharyngeal Swab     Status: None   Collection Time: 08/29/19  8:54 PM   Specimen: Nasopharyngeal Swab  Result Value Ref Range Status   SARS Coronavirus 2 NEGATIVE NEGATIVE Final    Comment: (NOTE) SARS-CoV-2 target nucleic acids are NOT DETECTED.  The SARS-CoV-2 RNA is generally detectable in upper and lower respiratory specimens during the acute phase of infection. The lowest concentration of SARS-CoV-2 viral copies this assay can detect is 250 copies / mL. A negative result does not preclude SARS-CoV-2 infection and should not be used as the sole basis for treatment or other patient management decisions.  A negative result may occur with improper specimen collection / handling, submission of specimen other than nasopharyngeal swab, presence of viral mutation(s) within the areas targeted by this assay, and inadequate number of viral copies (<250 copies / mL). A negative result must be combined with clinical observations, patient history, and epidemiological information.  Fact Sheet for Patients:   StrictlyIdeas.no  Fact Sheet for Healthcare Providers: BankingDealers.co.za  This test is not yet approved or  cleared by the [place] FDA and has been authorized for detection and/or diagnosis of SARS-CoV-2 by FDA under an Emergency Use Authorization (EUA).  This EUA will remain in effect (meaning this test can be used) for the duration of the COVID-19 declaration under Section 564(b)(1) of the Act, 21 U.S.C. section 360bbb-3(b)(1), unless the authorization is terminated or revoked sooner.  Performed at [facility], [place] [address], [place], [place]  [address]          Radiology Studies: No results found.      Scheduled Meds: . baclofen  30 mg Oral BID  . cholecalciferol  2,000 Units Oral Daily  . clotrimazole-betamethasone  1 application Topical BID  . dalfampridine  10 mg Oral BID  . enoxaparin (LOVENOX) injection  40 mg Subcutaneous Q24H  . gabapentin  200 mg Oral QHS  . lipase/protease/amylase  24,000 Units Oral TID AC  . loratadine  10 mg Oral Daily  . multivitamin with minerals  1 tablet Oral Daily  . pantoprazole  40 mg Oral Daily  . peg 3350 powder  0.5 kit Oral Once   And  . [START ON 09/02/2019] peg 3350 powder  0.5 kit Oral Once  . saccharomyces boulardii  250 mg Oral Daily  . sodium chloride flush  3 mL Intravenous Once  . traMADol  50 mg Oral Q6H   Continuous Infusions: . cefTRIAXone (ROCEPHIN)  IV Stopped (08/31/19 2145)  . metronidazole 500 mg (09/01/19 0810)     LOS: 3 days    Time spent: 35 minutes    Irine Seal, MD [facility]   To contact the attending provider between 7A-7P or the covering provider during after hours 7P-7A, please log into the web site [email or web address] and access using universal [place] password for that web site. If you do not have the password, please call the hospital operator.  09/01/2019, 1:12 PM

## 2019-09-01 NOTE — Progress Notes (Signed)
Nightshift RN sent stool sample for C diff test around midnight on 08/31/19.  Lab rejected sample, stating it was too formed to test for C diff.

## 2019-09-01 NOTE — Consult Note (Addendum)
Referring Provider:  [facility]         Primary Care Physician:  Cassandria Anger, MD Primary Gastroenterologist:  Oretha Caprice, MD                We were asked to see this patient for:   diarrhea               ASSESSMENT /  PLAN    [name] is a 64 y.o. male PMH significant for, but not necessarily limited to, multiple sclerosis, gallstone pancreatitis,  OA                                                                                                                                # Diarrhea with occasional blood / descending colitis on CT scan --3 months duration --Improved, nearly resolved since being admitted.  --Infectious etiology seems unlikely given duration --C-diff negative on 07/14/19 and negative GI pathogen panel ( including giardia) this admission. C-diff specimen this admit was rejected by lab as stool was too formed.   --Transient improvement with empiric antibiotics x 2 . Also improvement with short course of pancreatic enzymes ( no pmh or radiologic evidence of chronic pancreatitis).  --Given presentation, need to rule out IBD. Will need colonoscopy, most likely this admission. On clears. Will possibly prep later for colonoscopy.      HPI:    Chief Complaint: diarrhea  [name] is a 64 y.o. male with multiple sclerosis, currently admitted for diarrhea.  Patient typically struggles with constipation secondary to MS and medications.  He has had diarrhea for 3 months. Says diarrhea after eating seafood.  Diarrhea consist of watery to liquid brown stool several times a day.  No significant nocturnal stooling.  A few times patient has seen blood in the stool.  He has no associated cramping.  No nausea nor vomiting.  He recently lost 12 pounds, did not want to eat because tired of having diarrhea.  He cannot attribute diarrhea to any medication changes.  Over the last 3 months PCP has treated him empirically a couple of times with antibiotics with resolution  of diarrhea each time .separately, he was treated with a course of pancreatic enzymes (may be 10 days worth) and this also helped with the diarrhea. He has taken Imodium but it tends to cause constipation during which time he just passes mucous. CT scan this admission shows left sided colitis.   PREVIOUS ENDOSCOPIC EVALUATIONS / GI STUDIES : Patient doesn't think he has had a colonoscopy before.    Past Medical History:  Diagnosis Date  . Arthritis   . Asthma   . Gallstone pancreatitis   . Hernia   . History of inguinal hernia repair   . Low back pain   . MS (multiple sclerosis) ([place])   . Pancreatitis hosp[italized with gallstone pancreatitis 3/12[  . Vitamin D deficiency     Past Surgical History:  Procedure Laterality Date  . CHOLECYSTECTOMY  04/2010  . CYSTECTOMY     on back and R hand  . HERNIA REPAIR    . INGUINAL HERNIA REPAIR  03/17/2011   Procedure: HERNIA REPAIR INGUINAL ADULT;  Surgeon: Adin Hector, MD;  Location: [place];  Service: General;  Laterality: Left;  Open left inguinal hernia repair with mesh  . KNEE ARTHROSCOPY     right  . NOSE SURGERY    . TONSILLECTOMY    . WISDOM TOOTH EXTRACTION      Prior to Admission medications   Medication Sig Start Date End Date Taking? Authorizing Provider  amitriptyline (ELAVIL) 25 MG tablet TAKE 2 TABLETS AT BEDTIME AS NEEDED FOR SLEEP (OFFICE VISIT NEEDED BD REFILLS WILL BE GIVEN) Patient taking differently: Take 50 mg by mouth at bedtime.  06/09/19  Yes Plotnikov, Evie Lacks, MD  baclofen (LIORESAL) 20 MG tablet TAKE 1 TABLET FOUR TIMES A DAY AS NEEDED FOR MUSCLE SPASMS (OFFICE VISIT NEEDED BEFORE REFILLS WILL BE GIVEN) Patient taking differently: Take 30 mg by mouth 2 (two) times daily.  02/17/19  Yes Plotnikov, Evie Lacks, MD  cetirizine (ZYRTEC) 10 MG tablet Take 10 mg by mouth daily as needed for allergies.    Yes [provider]  cholecalciferol (VITAMIN D) 1000 units tablet Take 2,000 Units by  mouth daily.    Yes [provider]  clotrimazole-betamethasone (LOTRISONE) cream APPLY 1 APPLICATION TOPICALLY TWICE A DAY AS NEEDED FOR IRRITATION Patient taking differently: Apply 1 application topically 2 (two) times daily.  02/17/19  Yes Plotnikov, Evie Lacks, MD  dalfampridine (AMPYRA) 10 MG TB12 Take 1 tablet (10 mg total) by mouth 2 (two) times daily. 09/04/12  Yes Plotnikov, Evie Lacks, MD  diphenoxylate-atropine (LOMOTIL) 2.5-0.025 MG tablet Take 1 tablet by mouth 4 (four) times daily as needed for diarrhea or loose stools. 07/03/19  Yes Plotnikov, Evie Lacks, MD  fluticasone (FLONASE) 50 MCG/ACT nasal spray Place 2 sprays into both nostrils daily as needed for allergies or rhinitis.    Yes [provider]  gabapentin (NEURONTIN) 100 MG capsule TAKE 2 CAPSULES THREE TIMES A DAY AS NEEDED FOR PAIN Patient taking differently: Take 200 mg by mouth at bedtime.  12/30/18  Yes Plotnikov, Evie Lacks, MD  Multiple Vitamin (MULTIVITAMIN WITH MINERALS) TABS tablet Take 1 tablet by mouth daily.   Yes [provider]  ondansetron (ZOFRAN ODT) 4 MG disintegrating tablet Take 1 tablet (4 mg total) by mouth every 8 (eight) hours as needed for nausea or vomiting. 01/31/18  Yes Upstill, Nehemiah Settle, PA-C  pantoprazole (PROTONIX) 40 MG tablet TAKE 1 TABLET DAILY 01/18/19  Yes Plotnikov, Evie Lacks, MD  saccharomyces boulardii (FLORASTOR) 250 MG capsule Take 1 capsule (250 mg total) by mouth 2 (two) times daily. Patient taking differently: Take 250 mg by mouth daily.  07/03/19  Yes Plotnikov, Evie Lacks, MD  TraMADol HCl 200 MG CP24 Take 1 capsule by mouth daily. 07/23/19  Yes Plotnikov, Evie Lacks, MD  ciprofloxacin (CIPRO) 500 MG tablet Take 1 tablet (500 mg total) by mouth 2 (two) times daily. Patient not taking: Reported on 08/29/2019 07/03/19   Plotnikov, Evie Lacks, MD  CONZIP 200 MG CP24 TAKE 1 CAPSULE DAILY Patient not taking: Reported on 08/29/2019 12/31/18   Plotnikov, Evie Lacks, MD   ibuprofen (ADVIL,MOTRIN) 600 MG tablet Take 1 tablet (600 mg total) by mouth every 6 (six) hours as needed. Patient not taking: Reported on 08/29/2019 01/31/18   Upstill,  Nehemiah Settle, PA-C  ketorolac (TORADOL) 10 MG tablet Take 1 tablet (10 mg total) by mouth daily as needed for severe pain. Patient not taking: Reported on 08/29/2019 01/13/19   Plotnikov, Evie Lacks, MD  metroNIDAZOLE (FLAGYL) 500 MG tablet Take 1 tablet (500 mg total) by mouth 3 (three) times daily. Patient not taking: Reported on 08/29/2019 07/09/19   Plotnikov, Evie Lacks, MD  Pancrelipase, Lip-Prot-Amyl, (ZENPEP) 25000-79000 units CPEP Take 1-2 capsules by mouth 3 (three) times daily before meals. Patient not taking: Reported on 08/29/2019 07/09/19   Plotnikov, Evie Lacks, MD    Current Facility-Administered Medications  Medication Dose Route Frequency Provider Last Rate Last Admin  . acetaminophen (TYLENOL) tablet 650 mg  650 mg Oral Q6H PRN Mansy, Jan A, MD       Or  . acetaminophen (TYLENOL) suppository 650 mg  650 mg Rectal Q6H PRN Mansy, Jan A, MD      . baclofen (LIORESAL) tablet 30 mg  30 mg Oral BID Mansy, Jan A, MD   30 mg at 09/01/19 0959  . cefTRIAXone (ROCEPHIN) 2 g in sodium chloride 0.9 % 100 mL IVPB  2 g Intravenous Q24H Mansy, Arvella Merles, MD   Stopped at 08/31/19 2145  . cholecalciferol (VITAMIN D3) tablet 2,000 Units  2,000 Units Oral Daily Mansy, Arvella Merles, MD   2,000 Units at 09/01/19 0959  . clotrimazole-betamethasone (LOTRISONE) cream 1 application  1 application Topical BID Mansy, Arvella Merles, MD   1 application at 96/78/93 1000  . dalfampridine TB12 10 mg  10 mg Oral BID Mansy, Jan A, MD      . enoxaparin (LOVENOX) injection 40 mg  40 mg Subcutaneous Q24H Mansy, Jan A, MD      . feeding supplement (BOOST / RESOURCE BREEZE) liquid 1 Container  1 Container Oral TID BM Eugenie Filler, MD   1 Container at 09/01/19 1000  . fluticasone (FLONASE) 50 MCG/ACT nasal spray 2 spray  2 spray Each Nare Daily PRN Mansy, Jan A, MD      .  gabapentin (NEURONTIN) capsule 200 mg  200 mg Oral QHS Mansy, Jan A, MD   200 mg at 08/31/19 2116  . lipase/protease/amylase (CREON) capsule 24,000 Units  24,000 Units Oral TID AC Eugenie Filler, MD   24,000 Units at 09/01/19 [phone]  . loratadine (CLARITIN) tablet 10 mg  10 mg Oral Daily Mansy, Jan A, MD   10 mg at 09/01/19 0959  . metroNIDAZOLE (FLAGYL) IVPB 500 mg  500 mg Intravenous Q8H Mansy, Jan A, MD 100 mL/hr at 09/01/19 0810 500 mg at 09/01/19 0810  . morphine 2 MG/ML injection 2 mg  2 mg Intravenous Q4H PRN Mansy, Jan A, MD      . multivitamin with minerals tablet 1 tablet  1 tablet Oral Daily Mansy, Jan A, MD   1 tablet at 09/01/19 0959  . ondansetron (ZOFRAN) tablet 4 mg  4 mg Oral Q6H PRN Mansy, Jan A, MD       Or  . ondansetron [facility]) injection 4 mg  4 mg Intravenous Q6H PRN Mansy, Jan A, MD      . pantoprazole (PROTONIX) EC tablet 40 mg  40 mg Oral Daily Mansy, Jan A, MD   40 mg at 09/01/19 0959  . saccharomyces boulardii (FLORASTOR) capsule 250 mg  250 mg Oral Daily Mansy, Jan A, MD   250 mg at 09/01/19 0959  . sodium chloride flush (NS) 0.9 % injection 3 mL  3 mL Intravenous  Once Dorie Rank, MD      . traMADol Veatrice Bourbon) tablet 50 mg  50 mg Oral Q6H Mansy, Jan A, MD   50 mg at 09/01/19 0615    Allergies as of 08/29/2019 - Review Complete 08/29/2019  Allergen Reaction Noted  . Meperidine Other (See Comments) 09/25/2014  . Prednisone Palpitations 09/25/2014    Family History  Problem Relation Age of Onset  . Cancer Father        ?  . Diabetes Mother     Social History   Socioeconomic History  . Marital status: Married    Spouse name: Not on file  . Number of children: 2  . Years of education: Not on file  . Highest education level: Not on file  Occupational History  . Occupation: Health visitor: [employer]  Tobacco Use  . Smoking status: Never Smoker  . Smokeless tobacco: Never Used  Vaping Use  . Vaping Use: Never used  Substance and Sexual  Activity  . Alcohol use: No  . Drug use: No  . Sexual activity: Yes  Other Topics Concern  . Not on file  Social History Narrative  . Not on file   Social Determinants of Health   Financial Resource Strain:   . Difficulty of Paying Living Expenses:   Food Insecurity:   . Worried About Charity fundraiser in the Last Year:   . Arboriculturist in the Last Year:   Transportation Needs:   . Film/video editor (Medical):   [name] Lack of Transportation (Non-Medical):   Physical Activity:   . Days of Exercise per Week:   . Minutes of Exercise per Session:   Stress:   . Feeling of Stress :   Social Connections:   . Frequency of Communication with Friends and Family:   . Frequency of Social Gatherings with Friends and Family:   . Attends Religious Services:   . Active Member of Clubs or Organizations:   . Attends Archivist Meetings:   [name] Marital Status:   Intimate Partner Violence:   . Fear of Current or Ex-Partner:   . Emotionally Abused:   [name] Physically Abused:   . Sexually Abused:     Review of Systems: Positive for weight loss. All other systems reviewed and negative except where noted in HPI.  Physical Exam: Vital signs in last 24 hours: Temp:  [98.9 F (37.2 C)-100.3 F (37.9 C)] 99.4 F (37.4 C) (07/26 0508) Pulse Rate:  [92-94] 92 (07/26 0508) Resp:  [15-17] 16 (07/26 0508) BP: (121-135)/(72-83) 121/82 (07/26 0508) SpO2:  [96 %-98 %] 98 % (07/26 0508) Last BM Date: 09/01/19 General:   Alert, thin male in NAD Psych:  Pleasant, cooperative. Normal mood and affect. Eyes:  Pupils equal, sclera clear, no icterus.   Conjunctiva pink. Ears:  Normal auditory acuity. Nose:  No deformity, discharge,  or lesions. Neck:  Supple; no masses Lungs:  Clear throughout to auscultation.   Heart:  Regular rate and rhythm, 1+ RLE edema, 2+ LLE edema Abdomen:  Soft, non-distended, nontender, BS active, no palp mass   Rectal:  Deferred  Msk:  Symmetrical without gross  deformities. . Neurologic:  Alert and  oriented x4;  grossly normal neurologically. Skin:  Intact without significant lesions or rashes.   Intake/Output from previous day: 07/25 0701 - 07/26 0700 In: 540 [P.O.:240; IV Piggyback:300] Out: 200 [Urine:200] Intake/Output this shift: No intake/output data recorded.  Lab Results: Recent Labs  08/30/19 0631 08/31/19 0852 09/01/19 0655  WBC 16.4* 22.3* 20.9*  HGB 14.1 12.8* 13.0  HCT 43.1 37.6* 38.7*  PLT 236 224 241   BMET Recent Labs    08/30/19 0631 08/31/19 0852 09/01/19 0655  NA 140 136 135  K 4.1 3.4* 3.5  CL 103 103 101  CO2 22 22 23   GLUCOSE 77 139* 104*  BUN 9 <5* <5*  CREATININE 0.79 0.64 0.60*  CALCIUM 8.2* 7.7* 7.7*      . CBC Latest Ref Rng & Units 09/01/2019 08/31/2019 08/30/2019  WBC 4.0 - 10.5 K/uL 20.9(H) 22.3(H) 16.4(H)  Hemoglobin 13.0 - 17.0 g/dL 13.0 12.8(L) 14.1  Hematocrit 39 - 52 % 38.7(L) 37.6(L) 43.1  Platelets 150 - 400 K/uL 241 224 236    . CMP Latest Ref Rng & Units 09/01/2019 08/31/2019 08/30/2019  Glucose 70 - 99 mg/dL 104(H) 139(H) 77  BUN 8 - 23 mg/dL <5(L) <5(L) 9  Creatinine 0.61 - 1.24 mg/dL 0.60(L) 0.64 0.79  Sodium 135 - 145 mmol/L 135 136 140  Potassium 3.5 - 5.1 mmol/L 3.5 3.4(L) 4.1  Chloride 98 - 111 mmol/L 101 103 103  CO2 22 - 32 mmol/L 23 22 22   Calcium 8.9 - 10.3 mg/dL 7.7(L) 7.7(L) 8.2(L)  Total Protein 6.0 - 8.3 g/dL - - -  Total Bilirubin 0.2 - 1.2 mg/dL - - -  Alkaline Phos 39 - 117 U/L - - -  AST 0 - 37 U/L - - -  ALT 0 - 53 U/L - - -    Tye Savoy, NP-C @  09/01/2019, 12:21 PM   ________________________________________________________________________  Velora Heckler GI MD note:  I personally examined the patient, reviewed the data and agree with the assessment and plan described above. It was certainly reasonable to presume this was infectious diarrhea however at this point that is clearly in doubt and he needs a colonoscopy for more information.  He's had  negative c. Diff, negative gi path panel, he's completed 3 rounds of abx including two weeks or oral flagyl monotherapy.  Colon cancer does not run in his family.  I have some concern about new diagnosis of IBD.  He is currently on schedule for colonoscopy tomorrow at 7:30 AM.   Owens Loffler, MD [facility] Pager [phone]

## 2019-09-01 NOTE — Plan of Care (Signed)
Pt VS WNL this am.  No complaints at this time.   Problem: Education: Goal: Knowledge of General Education information will improve Description: Including pain rating scale, medication(s)/side effects and non-pharmacologic comfort measures Outcome: Progressing   Problem: Health Behavior/Discharge Planning: Goal: Ability to manage health-related needs will improve Outcome: Progressing   Problem: Clinical Measurements: Goal: Ability to maintain clinical measurements within normal limits will improve Outcome: Progressing Goal: Will remain free from infection Outcome: Progressing Goal: Diagnostic test results will improve Outcome: Progressing Goal: Respiratory complications will improve Outcome: Progressing Goal: Cardiovascular complication will be avoided Outcome: Progressing   Problem: Activity: Goal: Risk for activity intolerance will decrease Outcome: Progressing   Problem: Coping: Goal: Level of anxiety will decrease Outcome: Progressing   Problem: Elimination: Goal: Will not experience complications related to bowel motility Outcome: Progressing Goal: Will not experience complications related to urinary retention Outcome: Progressing   Problem: Pain Managment: Goal: General experience of comfort will improve Outcome: Progressing   Problem: Safety: Goal: Ability to remain free from injury will improve Outcome: Progressing   Problem: Skin Integrity: Goal: Risk for impaired skin integrity will decrease Outcome: Progressing   Problem: Nutrition: Goal: Adequate nutrition will be maintained Outcome: Not Progressing   

## 2019-09-01 NOTE — Anesthesia Preprocedure Evaluation (Addendum)
Anesthesia Evaluation  Patient identified by MRN, date of birth, ID band Patient awake    Reviewed: Allergy & Precautions, NPO status , Patient's Chart, lab work & pertinent test results  History of Anesthesia Complications Negative for: history of anesthetic complications  Airway Mallampati: II  TM Distance: >3 FB Neck ROM: Full    Dental  (+) Chipped, Dental Advisory Given   Pulmonary COPD,  08/29/2019 SARS coronavirus NEG   breath sounds clear to auscultation       Cardiovascular negative cardio ROS   Rhythm:Regular Rate:Normal     Neuro/Psych  Neuromuscular disease (MS: uses motorized chair)    GI/Hepatic Neg liver ROS, GERD  Medicated and Controlled,H/o pancreatitis   Endo/Other  negative endocrine ROS  Renal/GU negative Renal ROS     Musculoskeletal   Abdominal   Peds  Hematology negative hematology ROS (+)   Anesthesia Other Findings   Reproductive/Obstetrics                            Anesthesia Physical Anesthesia Plan  ASA: III  Anesthesia Plan: MAC   Post-op Pain Management:    Induction:   PONV Risk Score and Plan: 1 and Treatment may vary due to age or medical condition  Airway Management Planned: Natural Airway and Simple Face Mask  Additional Equipment: None  Intra-op Plan:   Post-operative Plan:   Informed Consent: I have reviewed the patients History and Physical, chart, labs and discussed the procedure including the risks, benefits and alternatives for the proposed anesthesia with the patient or authorized representative who has indicated his/her understanding and acceptance.     Dental advisory given  Plan Discussed with: CRNA and Surgeon  Anesthesia Plan Comments:        Anesthesia Quick Evaluation

## 2019-09-01 NOTE — H&P (View-Only) (Signed)
Referring Provider:  [facility]         Primary Care Physician:  Cassandria Anger, MD Primary Gastroenterologist:  Oretha Caprice, MD                We were asked to see this patient for:   diarrhea               ASSESSMENT /  PLAN    [name] is a 64 y.o. male PMH significant for, but not necessarily limited to, multiple sclerosis, gallstone pancreatitis,  OA                                                                                                                                # Diarrhea with occasional blood / descending colitis on CT scan --3 months duration --Improved, nearly resolved since being admitted.  --Infectious etiology seems unlikely given duration --C-diff negative on 07/14/19 and negative GI pathogen panel ( including giardia) this admission. C-diff specimen this admit was rejected by lab as stool was too formed.   --Transient improvement with empiric antibiotics x 2 . Also improvement with short course of pancreatic enzymes ( no pmh or radiologic evidence of chronic pancreatitis).  --Given presentation, need to rule out IBD. Will need colonoscopy, most likely this admission. On clears. Will possibly prep later for colonoscopy.      HPI:    Chief Complaint: diarrhea  [name] is a 64 y.o. male with multiple sclerosis, currently admitted for diarrhea.  Patient typically struggles with constipation secondary to MS and medications.  He has had diarrhea for 3 months. Says diarrhea after eating seafood.  Diarrhea consist of watery to liquid brown stool several times a day.  No significant nocturnal stooling.  A few times patient has seen blood in the stool.  He has no associated cramping.  No nausea nor vomiting.  He recently lost 12 pounds, did not want to eat because tired of having diarrhea.  He cannot attribute diarrhea to any medication changes.  Over the last 3 months PCP has treated him empirically a couple of times with antibiotics with resolution  of diarrhea each time .separately, he was treated with a course of pancreatic enzymes (may be 10 days worth) and this also helped with the diarrhea. He has taken Imodium but it tends to cause constipation during which time he just passes mucous. CT scan this admission shows left sided colitis.   PREVIOUS ENDOSCOPIC EVALUATIONS / GI STUDIES : Patient doesn't think he has had a colonoscopy before.    Past Medical History:  Diagnosis Date  . Arthritis   . Asthma   . Gallstone pancreatitis   . Hernia   . History of inguinal hernia repair   . Low back pain   . MS (multiple sclerosis) ([place])   . Pancreatitis hosp[italized with gallstone pancreatitis 3/12[  . Vitamin D deficiency     Past Surgical History:  Procedure Laterality Date  . CHOLECYSTECTOMY  04/2010  . CYSTECTOMY     on back and R hand  . HERNIA REPAIR    . INGUINAL HERNIA REPAIR  03/17/2011   Procedure: HERNIA REPAIR INGUINAL ADULT;  Surgeon: Adin Hector, MD;  Location: [facility];  Service: General;  Laterality: Left;  Open left inguinal hernia repair with mesh  . KNEE ARTHROSCOPY     right  . NOSE SURGERY    . TONSILLECTOMY    . WISDOM TOOTH EXTRACTION      Prior to Admission medications   Medication Sig Start Date End Date Taking? Authorizing Provider  amitriptyline (ELAVIL) 25 MG tablet TAKE 2 TABLETS AT BEDTIME AS NEEDED FOR SLEEP (OFFICE VISIT NEEDED BD REFILLS WILL BE GIVEN) Patient taking differently: Take 50 mg by mouth at bedtime.  06/09/19  Yes Plotnikov, Evie Lacks, MD  baclofen (LIORESAL) 20 MG tablet TAKE 1 TABLET FOUR TIMES A DAY AS NEEDED FOR MUSCLE SPASMS (OFFICE VISIT NEEDED BEFORE REFILLS WILL BE GIVEN) Patient taking differently: Take 30 mg by mouth 2 (two) times daily.  02/17/19  Yes Plotnikov, Evie Lacks, MD  cetirizine (ZYRTEC) 10 MG tablet Take 10 mg by mouth daily as needed for allergies.    Yes [provider]  cholecalciferol (VITAMIN D) 1000 units tablet Take 2,000 Units by  mouth daily.    Yes [provider]  clotrimazole-betamethasone (LOTRISONE) cream APPLY 1 APPLICATION TOPICALLY TWICE A DAY AS NEEDED FOR IRRITATION Patient taking differently: Apply 1 application topically 2 (two) times daily.  02/17/19  Yes Plotnikov, Evie Lacks, MD  dalfampridine (AMPYRA) 10 MG TB12 Take 1 tablet (10 mg total) by mouth 2 (two) times daily. 09/04/12  Yes Plotnikov, Evie Lacks, MD  diphenoxylate-atropine (LOMOTIL) 2.5-0.025 MG tablet Take 1 tablet by mouth 4 (four) times daily as needed for diarrhea or loose stools. 07/03/19  Yes Plotnikov, Evie Lacks, MD  fluticasone (FLONASE) 50 MCG/ACT nasal spray Place 2 sprays into both nostrils daily as needed for allergies or rhinitis.    Yes [provider]  gabapentin (NEURONTIN) 100 MG capsule TAKE 2 CAPSULES THREE TIMES A DAY AS NEEDED FOR PAIN Patient taking differently: Take 200 mg by mouth at bedtime.  12/30/18  Yes Plotnikov, Evie Lacks, MD  Multiple Vitamin (MULTIVITAMIN WITH MINERALS) TABS tablet Take 1 tablet by mouth daily.   Yes [provider]  ondansetron (ZOFRAN ODT) 4 MG disintegrating tablet Take 1 tablet (4 mg total) by mouth every 8 (eight) hours as needed for nausea or vomiting. 01/31/18  Yes Upstill, Nehemiah Settle, PA-C  pantoprazole (PROTONIX) 40 MG tablet TAKE 1 TABLET DAILY 01/18/19  Yes Plotnikov, Evie Lacks, MD  saccharomyces boulardii (FLORASTOR) 250 MG capsule Take 1 capsule (250 mg total) by mouth 2 (two) times daily. Patient taking differently: Take 250 mg by mouth daily.  07/03/19  Yes Plotnikov, Evie Lacks, MD  TraMADol HCl 200 MG CP24 Take 1 capsule by mouth daily. 07/23/19  Yes Plotnikov, Evie Lacks, MD  ciprofloxacin (CIPRO) 500 MG tablet Take 1 tablet (500 mg total) by mouth 2 (two) times daily. Patient not taking: Reported on 08/29/2019 07/03/19   Plotnikov, Evie Lacks, MD  CONZIP 200 MG CP24 TAKE 1 CAPSULE DAILY Patient not taking: Reported on 08/29/2019 12/31/18   Plotnikov, Evie Lacks, MD    ibuprofen (ADVIL,MOTRIN) 600 MG tablet Take 1 tablet (600 mg total) by mouth every 6 (six) hours as needed. Patient not taking: Reported on 08/29/2019 01/31/18  Charlann Lange, PA-C  ketorolac (TORADOL) 10 MG tablet Take 1 tablet (10 mg total) by mouth daily as needed for severe pain. Patient not taking: Reported on 08/29/2019 01/13/19   Plotnikov, Evie Lacks, MD  metroNIDAZOLE (FLAGYL) 500 MG tablet Take 1 tablet (500 mg total) by mouth 3 (three) times daily. Patient not taking: Reported on 08/29/2019 07/09/19   Plotnikov, Evie Lacks, MD  Pancrelipase, Lip-Prot-Amyl, (ZENPEP) 25000-79000 units CPEP Take 1-2 capsules by mouth 3 (three) times daily before meals. Patient not taking: Reported on 08/29/2019 07/09/19   Plotnikov, Evie Lacks, MD    Current Facility-Administered Medications  Medication Dose Route Frequency Provider Last Rate Last Admin  . acetaminophen (TYLENOL) tablet 650 mg  650 mg Oral Q6H PRN Mansy, Jan A, MD       Or  . acetaminophen (TYLENOL) suppository 650 mg  650 mg Rectal Q6H PRN Mansy, Jan A, MD      . baclofen (LIORESAL) tablet 30 mg  30 mg Oral BID Mansy, Jan A, MD   30 mg at 09/01/19 0959  . cefTRIAXone (ROCEPHIN) 2 g in sodium chloride 0.9 % 100 mL IVPB  2 g Intravenous Q24H Mansy, Arvella Merles, MD   Stopped at 08/31/19 2145  . cholecalciferol (VITAMIN D3) tablet 2,000 Units  2,000 Units Oral Daily Mansy, Arvella Merles, MD   2,000 Units at 09/01/19 0959  . clotrimazole-betamethasone (LOTRISONE) cream 1 application  1 application Topical BID Mansy, Arvella Merles, MD   1 application at 82/42/35 1000  . dalfampridine TB12 10 mg  10 mg Oral BID Mansy, Jan A, MD      . enoxaparin (LOVENOX) injection 40 mg  40 mg Subcutaneous Q24H Mansy, Jan A, MD      . feeding supplement (BOOST / RESOURCE BREEZE) liquid 1 Container  1 Container Oral TID BM Eugenie Filler, MD   1 Container at 09/01/19 1000  . fluticasone (FLONASE) 50 MCG/ACT nasal spray 2 spray  2 spray Each Nare Daily PRN Mansy, Jan A, MD      .  gabapentin (NEURONTIN) capsule 200 mg  200 mg Oral QHS Mansy, Jan A, MD   200 mg at 08/31/19 2116  . lipase/protease/amylase (CREON) capsule 24,000 Units  24,000 Units Oral TID AC Eugenie Filler, MD   24,000 Units at 09/01/19 [phone]  . loratadine (CLARITIN) tablet 10 mg  10 mg Oral Daily Mansy, Jan A, MD   10 mg at 09/01/19 0959  . metroNIDAZOLE (FLAGYL) IVPB 500 mg  500 mg Intravenous Q8H Mansy, Jan A, MD 100 mL/hr at 09/01/19 0810 500 mg at 09/01/19 0810  . morphine 2 MG/ML injection 2 mg  2 mg Intravenous Q4H PRN Mansy, Jan A, MD      . multivitamin with minerals tablet 1 tablet  1 tablet Oral Daily Mansy, Jan A, MD   1 tablet at 09/01/19 0959  . ondansetron (ZOFRAN) tablet 4 mg  4 mg Oral Q6H PRN Mansy, Jan A, MD       Or  . ondansetron [facility]) injection 4 mg  4 mg Intravenous Q6H PRN Mansy, Jan A, MD      . pantoprazole (PROTONIX) EC tablet 40 mg  40 mg Oral Daily Mansy, Jan A, MD   40 mg at 09/01/19 0959  . saccharomyces boulardii (FLORASTOR) capsule 250 mg  250 mg Oral Daily Mansy, Jan A, MD   250 mg at 09/01/19 0959  . sodium chloride flush (NS) 0.9 % injection 3 mL  3 mL  Intravenous Once Dorie Rank, MD      . traMADol Veatrice Bourbon) tablet 50 mg  50 mg Oral Q6H Mansy, Jan A, MD   50 mg at 09/01/19 0615    Allergies as of 08/29/2019 - Review Complete 08/29/2019  Allergen Reaction Noted  . Meperidine Other (See Comments) 09/25/2014  . Prednisone Palpitations 09/25/2014    Family History  Problem Relation Age of Onset  . Cancer Father        ?  . Diabetes Mother     Social History   Socioeconomic History  . Marital status: Married    Spouse name: Not on file  . Number of children: 2  . Years of education: Not on file  . Highest education level: Not on file  Occupational History  . Occupation: Health visitor: [employer]  Tobacco Use  . Smoking status: Never Smoker  . Smokeless tobacco: Never Used  Vaping Use  . Vaping Use: Never used  Substance and Sexual  Activity  . Alcohol use: No  . Drug use: No  . Sexual activity: Yes  Other Topics Concern  . Not on file  Social History Narrative  . Not on file   Social Determinants of Health   Financial Resource Strain:   . Difficulty of Paying Living Expenses:   Food Insecurity:   . Worried About Charity fundraiser in the Last Year:   . Arboriculturist in the Last Year:   Transportation Needs:   . Film/video editor (Medical):   [name] Lack of Transportation (Non-Medical):   Physical Activity:   . Days of Exercise per Week:   . Minutes of Exercise per Session:   Stress:   . Feeling of Stress :   Social Connections:   . Frequency of Communication with Friends and Family:   . Frequency of Social Gatherings with Friends and Family:   . Attends Religious Services:   . Active Member of Clubs or Organizations:   . Attends Archivist Meetings:   [name] Marital Status:   Intimate Partner Violence:   . Fear of Current or Ex-Partner:   . Emotionally Abused:   [name] Physically Abused:   . Sexually Abused:     Review of Systems: Positive for weight loss. All other systems reviewed and negative except where noted in HPI.  Physical Exam: Vital signs in last 24 hours: Temp:  [98.9 F (37.2 C)-100.3 F (37.9 C)] 99.4 F (37.4 C) (07/26 0508) Pulse Rate:  [92-94] 92 (07/26 0508) Resp:  [15-17] 16 (07/26 0508) BP: (121-135)/(72-83) 121/82 (07/26 0508) SpO2:  [96 %-98 %] 98 % (07/26 0508) Last BM Date: 09/01/19 General:   Alert, thin male in NAD Psych:  Pleasant, cooperative. Normal mood and affect. Eyes:  Pupils equal, sclera clear, no icterus.   Conjunctiva pink. Ears:  Normal auditory acuity. Nose:  No deformity, discharge,  or lesions. Neck:  Supple; no masses Lungs:  Clear throughout to auscultation.   Heart:  Regular rate and rhythm, 1+ RLE edema, 2+ LLE edema Abdomen:  Soft, non-distended, nontender, BS active, no palp mass   Rectal:  Deferred  Msk:  Symmetrical without gross  deformities. . Neurologic:  Alert and  oriented x4;  grossly normal neurologically. Skin:  Intact without significant lesions or rashes.   Intake/Output from previous day: 07/25 0701 - 07/26 0700 In: 540 [P.O.:240; IV Piggyback:300] Out: 200 [Urine:200] Intake/Output this shift: No intake/output data recorded.  Lab Results: Recent Labs  08/30/19 0631 08/31/19 0852 09/01/19 0655  WBC 16.4* 22.3* 20.9*  HGB 14.1 12.8* 13.0  HCT 43.1 37.6* 38.7*  PLT 236 224 241   BMET Recent Labs    08/30/19 0631 08/31/19 0852 09/01/19 0655  NA 140 136 135  K 4.1 3.4* 3.5  CL 103 103 101  CO2 22 22 23   GLUCOSE 77 139* 104*  BUN 9 <5* <5*  CREATININE 0.79 0.64 0.60*  CALCIUM 8.2* 7.7* 7.7*      . CBC Latest Ref Rng & Units 09/01/2019 08/31/2019 08/30/2019  WBC 4.0 - 10.5 K/uL 20.9(H) 22.3(H) 16.4(H)  Hemoglobin 13.0 - 17.0 g/dL 13.0 12.8(L) 14.1  Hematocrit 39 - 52 % 38.7(L) 37.6(L) 43.1  Platelets 150 - 400 K/uL 241 224 236    . CMP Latest Ref Rng & Units 09/01/2019 08/31/2019 08/30/2019  Glucose 70 - 99 mg/dL 104(H) 139(H) 77  BUN 8 - 23 mg/dL <5(L) <5(L) 9  Creatinine 0.61 - 1.24 mg/dL 0.60(L) 0.64 0.79  Sodium 135 - 145 mmol/L 135 136 140  Potassium 3.5 - 5.1 mmol/L 3.5 3.4(L) 4.1  Chloride 98 - 111 mmol/L 101 103 103  CO2 22 - 32 mmol/L 23 22 22   Calcium 8.9 - 10.3 mg/dL 7.7(L) 7.7(L) 8.2(L)  Total Protein 6.0 - 8.3 g/dL - - -  Total Bilirubin 0.2 - 1.2 mg/dL - - -  Alkaline Phos 39 - 117 U/L - - -  AST 0 - 37 U/L - - -  ALT 0 - 53 U/L - - -    Tye Savoy, NP-C @  09/01/2019, 12:21 PM   ________________________________________________________________________  Velora Heckler GI MD note:  I personally examined the patient, reviewed the data and agree with the assessment and plan described above. It was certainly reasonable to presume this was infectious diarrhea however at this point that is clearly in doubt and he needs a colonoscopy for more information.  He's had  negative c. Diff, negative gi path panel, he's completed 3 rounds of abx including two weeks or oral flagyl monotherapy.  Colon cancer does not run in his family.  I have some concern about new diagnosis of IBD.  He is currently on schedule for colonoscopy tomorrow at 7:30 AM.   Owens Loffler, MD [facility] Pager [phone]

## 2019-09-02 ENCOUNTER — Inpatient Hospital Stay (HOSPITAL_COMMUNITY): Payer: Medicare Other | Admitting: Anesthesiology

## 2019-09-02 ENCOUNTER — Encounter (HOSPITAL_COMMUNITY)
Admission: EM | Disposition: A | Discharge status: Home / Self Care | Payer: Self-pay | Source: Home / Self Care | Attending: Internal Medicine

## 2019-09-02 ENCOUNTER — Encounter (HOSPITAL_COMMUNITY): Payer: Self-pay | Admitting: Family Medicine

## 2019-09-02 ENCOUNTER — Ambulatory Visit: Payer: Medicare Other | Admitting: Gastroenterology

## 2019-09-02 DIAGNOSIS — K635 Polyp of colon: Secondary | ICD-10-CM

## 2019-09-02 DIAGNOSIS — K921 Melena: Secondary | ICD-10-CM

## 2019-09-02 DIAGNOSIS — D120 Benign neoplasm of cecum: Secondary | ICD-10-CM

## 2019-09-02 DIAGNOSIS — R933 Abnormal findings on diagnostic imaging of other parts of digestive tract: Secondary | ICD-10-CM

## 2019-09-02 HISTORY — PX: COLONOSCOPY WITH PROPOFOL: SHX5780

## 2019-09-02 HISTORY — PX: BIOPSY: SHX5522

## 2019-09-02 HISTORY — PX: POLYPECTOMY: SHX5525

## 2019-09-02 LAB — CBC WITH DIFFERENTIAL/PLATELET
Abs Immature Granulocytes: 0.19 10*3/uL — ABNORMAL HIGH (ref 0.00–0.07)
Basophils Absolute: 0.1 10*3/uL (ref 0.0–0.1)
Basophils Relative: 0 %
Eosinophils Absolute: 0 10*3/uL (ref 0.0–0.5)
Eosinophils Relative: 0 %
HCT: 37.7 % — ABNORMAL LOW (ref 39.0–52.0)
Hemoglobin: 12.3 g/dL — ABNORMAL LOW (ref 13.0–17.0)
Immature Granulocytes: 1 %
Lymphocytes Relative: 2 %
Lymphs Abs: 0.6 10*3/uL — ABNORMAL LOW (ref 0.7–4.0)
MCH: 30.4 pg (ref 26.0–34.0)
MCHC: 32.6 g/dL (ref 30.0–36.0)
MCV: 93.3 fL (ref 80.0–100.0)
Monocytes Absolute: 1.7 10*3/uL — ABNORMAL HIGH (ref 0.1–1.0)
Monocytes Relative: 7 %
Neutro Abs: 22.8 10*3/uL — ABNORMAL HIGH (ref 1.7–7.7)
Neutrophils Relative %: 90 %
Platelets: 217 10*3/uL (ref 150–400)
RBC: 4.04 MIL/uL — ABNORMAL LOW (ref 4.22–5.81)
RDW: 13.2 % (ref 11.5–15.5)
WBC: 25.3 10*3/uL — ABNORMAL HIGH (ref 4.0–10.5)
nRBC: 0 % (ref 0.0–0.2)

## 2019-09-02 LAB — BASIC METABOLIC PANEL
Anion gap: 11 (ref 5–15)
BUN: 7 mg/dL — ABNORMAL LOW (ref 8–23)
CO2: 23 mmol/L (ref 22–32)
Calcium: 7.5 mg/dL — ABNORMAL LOW (ref 8.9–10.3)
Chloride: 104 mmol/L (ref 98–111)
Creatinine, Ser: 0.78 mg/dL (ref 0.61–1.24)
GFR calc Af Amer: >60 mL/min (ref >60)
GFR calc non Af Amer: >60 mL/min (ref >60)
Glucose, Bld: 127 mg/dL — ABNORMAL HIGH (ref 70–99)
Potassium: 3.4 mmol/L — ABNORMAL LOW (ref 3.5–5.1)
Sodium: 138 mmol/L (ref 135–145)

## 2019-09-02 LAB — MAGNESIUM: Magnesium: 1.8 mg/dL (ref 1.7–2.4)

## 2019-09-02 SURGERY — COLONOSCOPY WITH PROPOFOL
Anesthesia: Monitor Anesthesia Care

## 2019-09-02 MED ORDER — MAGNESIUM SULFATE 2 GM/50ML IV SOLN
2.0000 g | Freq: Once | INTRAVENOUS | Status: AC
  Administered 2019-09-02: 2 g via INTRAVENOUS
  Filled 2019-09-02: qty 50

## 2019-09-02 MED ORDER — PROPOFOL 500 MG/50ML IV EMUL
INTRAVENOUS | Status: DC | PRN
  Administered 2019-09-02: 130 ug/kg/min via INTRAVENOUS

## 2019-09-02 MED ORDER — PROPOFOL 1000 MG/100ML IV EMUL
INTRAVENOUS | Status: AC
  Filled 2019-09-02: qty 100

## 2019-09-02 MED ORDER — SODIUM CHLORIDE 0.9 % IV SOLN: INTRAVENOUS | Status: DC

## 2019-09-02 MED ORDER — POTASSIUM CHLORIDE CRYS ER 20 MEQ PO TBCR
40.0000 meq | EXTENDED_RELEASE_TABLET | Freq: Once | ORAL | Status: AC
  Administered 2019-09-02: 40 meq via ORAL
  Filled 2019-09-02: qty 2

## 2019-09-02 MED ORDER — VANCOMYCIN 50 MG/ML ORAL SOLUTION
125.0000 mg | Freq: Four times a day (QID) | ORAL | Status: DC
  Administered 2019-09-02 – 2019-09-03 (×4): 125 mg via ORAL
  Filled 2019-09-02 (×6): qty 2.5

## 2019-09-02 MED ORDER — PROPOFOL 10 MG/ML IV BOLUS
INTRAVENOUS | Status: DC | PRN
Start: 2019-09-02 — End: 2019-09-02
  Administered 2019-09-02: 20 mg via INTRAVENOUS

## 2019-09-02 SURGICAL SUPPLY — 22 items

## 2019-09-02 NOTE — Interval H&P Note (Signed)
History and Physical Interval Note:  09/02/2019 7:30 AM  [name]  has presented today for surgery, with the diagnosis of diarrhea, abnormal CT scan.  The various methods of treatment have been discussed with the patient and family. After consideration of risks, benefits and other options for treatment, the patient has consented to  Procedure(s): COLONOSCOPY WITH PROPOFOL (N/A) as a surgical intervention.  The patient's history has been reviewed, patient examined, no change in status, stable for surgery.  I have reviewed the patient's chart and labs.  Questions were answered to the patient's satisfaction.     Pricilla Riffle. Fuller Plan

## 2019-09-02 NOTE — Op Note (Addendum)
[facility] Patient Name: [name] Procedure Date: 09/02/2019 MRN: [id number] Attending MD: Ladene Artist , MD Date of Birth: [birth year] CSN: [id number] Age: 64 Admit Type: Inpatient Procedure:                Colonoscopy Indications:              Clinically significant diarrhea of unexplained                            origin, Hematochezia, Abnormal CT of the GI tract                            (colon) Providers:                Pricilla Riffle. Fuller Plan, MD, Cleda Daub, RN, Corie Chiquito, Technician, Courtney Heys. Armistead, CRNA Referring MD:             [facility] Medicines:                Monitored Anesthesia Care Complications:            No immediate complications. Estimated blood loss:                            None. Estimated Blood Loss:     Estimated blood loss: none. Procedure:                Pre-Anesthesia Assessment:                           - Prior to the procedure, a History and Physical                            was performed, and patient medications and                            allergies were reviewed. The patient's tolerance of                            previous anesthesia was also reviewed. The risks                            and benefits of the procedure and the sedation                            options and risks were discussed with the patient.                            All questions were answered, and informed consent                            was obtained. Prior Anticoagulants: The patient has                            taken no previous  anticoagulant or antiplatelet                            agents. ASA Grade Assessment: III - A patient with                            severe systemic disease. After reviewing the risks                            and benefits, the patient was deemed in                            satisfactory condition to undergo the procedure.                           After obtaining informed consent,  the colonoscope                            was passed under direct vision. Throughout the                            procedure, the patient's blood pressure, pulse, and                            oxygen saturations were monitored continuously. The                            CF-HQ190L ([id number] Olympus colonoscope was                            introduced through the anus and advanced to the the                            terminal ileum, with identification of the                            appendiceal orifice and IC valve. The ileocecal                            valve, appendiceal orifice, and rectum were                            photographed. The quality of the bowel preparation                            was inadequate despite extensive lavage and                            suction. Unable to safely retroflex with narrow                            rectal vault. The colonoscopy was performed without  difficulty. The patient tolerated the procedure                            well. Scope In: 7:43:54 AM Scope Out: 8:10:42 AM Scope Withdrawal Time: 0 hours 23 minutes 6 seconds  Total Procedure Duration: 0 hours 26 minutes 48 seconds  Findings:      The perianal and digital rectal examinations were normal.      A 7 mm polyp was found in the cecum. The polyp was sessile. The polyp       was removed with a cold snare. Resection and retrieval were complete.      The terminal ileum appeared normal on a brief view, no photo.      Patchy moderate inflammation characterized by altered vascularity,       congestion (edema), exudates and friability was found in the entire       colon. Most active in descending colon and ascending colon. Biopsies       were taken with a cold forceps for histology.      The exam was otherwise without abnormality on direct and retroflexion       views. Impression:               - Preparation of the colon was inadequate.                            - One 7 mm polyp in the cecum, removed with a cold                            snare. Resected and retrieved.                           - Terminal ileum appeared normal.                           - Patchy moderate inflammation was found in the                            entire examined colon secondary to pancolitis.                            Biopsied.                           - The examination was otherwise normal on direct                            views. Moderate Sedation:      Not Applicable - Patient had care per Anesthesia. Recommendation:           - Repeat colonoscopy date to be determined after                            pending pathology results are reviewed for                            surveillance based on pathology results with Dr.  Oretha Caprice.                           - Patient has a contact number available for                            emergencies. The signs and symptoms of potential                            delayed complications were discussed with the                            patient. Return to normal activities tomorrow.                            Written discharge instructions were provided to the                            patient.                           - Resume previous diet.                           - Continue present medications.                           - Findings c/w an infectious colitis.                           - Await pathology results. Procedure Code(s):        --- Professional ---                           [phone], Colonoscopy, flexible; with removal of                            tumor(s), polyp(s), or other lesion(s) by snare                            technique                           45380, 62, Colonoscopy, flexible; with biopsy,                            single or multiple Diagnosis Code(s):        --- Professional ---                           K63.5, Polyp of colon                           K52.9,  Noninfective gastroenteritis and colitis,                            unspecified  R19.7, Diarrhea, unspecified                           K92.1, Melena (includes Hematochezia)                           R93.3, Abnormal findings on diagnostic imaging of                            other parts of digestive tract CPT copyright 2019 American Medical Association. All rights reserved. The codes documented in this report are preliminary and upon coder review may  be revised to meet current compliance requirements. Ladene Artist, MD 09/02/2019 8:19:04 AM This report has been signed electronically. Number of Addenda: 0

## 2019-09-02 NOTE — Care Management Important Message (Signed)
Important Message  Patient Details  IM Letter presented to the Patient Name: [name] MRN: [id number] Date of Birth: [birth year]   Medicare Important Message Given:  Yes     Kerin Salen 09/02/2019, 4:10 PM

## 2019-09-02 NOTE — Transfer of Care (Signed)
Immediate Anesthesia Transfer of Care Note  Patient: [name]  Procedure(s) Performed: COLONOSCOPY WITH PROPOFOL (N/A ) BIOPSY  Patient Location: PACU and Endoscopy Unit  Anesthesia Type:MAC  Level of Consciousness: awake, alert , oriented and patient cooperative  Airway & Oxygen Therapy: Patient Spontanous Breathing and Patient connected to face mask oxygen  Post-op Assessment: Report given to RN, Post -op Vital signs reviewed and stable and Patient moving all extremities  Post vital signs: Reviewed and stable  Last Vitals:  Vitals Value Taken Time  BP    Temp    Pulse    Resp 20 09/02/19 0821  SpO2    Vitals shown include unvalidated device data.  Last Pain:  Vitals:   09/02/19 0706  TempSrc: Oral  PainSc: 0-No pain         Complications: No complications documented.

## 2019-09-02 NOTE — Progress Notes (Addendum)
PROGRESS NOTE    [name]  AQT:[id number] DOB: Jul 22, 1955 DOA: 08/29/2019 PCP: Cassandria Anger, MD    Chief Complaint  Patient presents with  . Weakness  . Diarrhea    Brief Narrative: HPI per Dr. Felecia Shelling Shepperson  is a 64 y.o. Caucasian male with a known history of multiple sclerosis, asthma, pancreatitis and osteoarthritis, presented to the emergency room with a cancer of intermittent diarrhea over the last couple months for which he was given 2 courses of oral antibiotics on an outpatient basis.  For the last month and he finished his second course he started having recurrent diarrhea which has been mainly watery with mucus and rarely blood.  No melena.  No fever or chills.  He denies any nausea or vomiting.  He has been having crampy abdominal pain.  No chest pain or dyspnea or cough.  No dysuria oliguria or hematuria or flank pain.  Upon position to the emergency room, blood pressure was 137/82 with otherwise normal vital signs.  Labs revealed neck cytosis 18.2 with unremarkable BMP and urinalysis.  Abdominal pelvic CT scan revealed inflammatory or infectious colitis involving the descending colon with mild enlargement of the prostate.  The patient was given IV Cipro and Flagyl as well as 500 mill IV normal saline bolus followed by 125 mL/h.  He will be admitted to a medical bed for further evaluation and management.  Assessment & Plan:   Principal Problem:   Acute colitis Active Problems:   Multiple sclerosis (HCC)   Paresthesia   GERD (gastroesophageal reflux disease)   Peripheral neuropathy   Chronic pancreatitis (HCC)   Abnormal CT scan, colon   Benign neoplasm of cecum   Hematochezia  1 acute colitis with subsequent mild sepsis without severe sepsis or septic shock, POA Patient met criteria for mild sepsis with a leukocytosis, low-grade fever.  CT abdomen and pelvis which was done concerning for inflammatory or infectious colitis involving the descending  colon.  Stable mild enlargement of the prostate.  Stool studies obtained and C. difficile PCR are obtained, patient now with formed stools. Giardia negative, cryptosporidium negative, GI pathogen panel negative.  Patient still with a significant leukocytosis and currently at 25.3.   Improved clinically.  IV fluids saline lock. Patient noted to have negative C. difficile test on 07/14/2019, GI pathogen panel during this hospitalization negative, Giardia negative, unable to obtain C. difficile as stools formed during this hospitalization.  Patient has received 2 rounds of antibiotics with transient improvement as well as some improvement on a short course of pancreatic enzymes.  Patient currently on IV antibiotics.  Concern for IBD.  Patient seen in consultation by GI and patient underwent colonoscopy this morning which showed a 7 mm polyp in the cecum status post resection and retrieval, pancolitis noted and biopsied.  GI recommended empiric treatment for C. difficile colitis from findings noted on colonoscopy which are concerning for infectious etiology rather than IBD.  Flagyl discontinued and patient started on oral vancomycin per GI.  Place on enteric precautions.  Appreciate GI input and recommendations.   2.  Dehydration Hydrated.  Saline lock IV fluids.  3.  Multiple sclerosis Continue baclofen, dalfampridine.  4.  Gastroesophageal reflux disease Continue PPI.   5.  Peripheral neuropathy Neurontin.  6.  Hypokalemia Replete.   DVT prophylaxis: Lovenox Code Status: Full Family Communication: Updated patient and wife at bedside. Disposition:   Status is: Inpatient    Dispo: The patient is from: Home  Anticipated d/c is to: Home              Anticipated d/c date is: To be determined.  Hopefully in 3 to 4 days.              Patient currently status post colonoscopy today, started empirically on oral vancomycin for probable C. difficile colitis per GI recommendations.   Currently not stable for discharge.        Consultants:   Gastroenterology: Dr. Ardis Hughs 09/01/2019  Procedures:   CT abdomen and pelvis 08/29/2019  Colonoscopy 09/02/2019 per Dr. Fuller Plan  Antimicrobials:   IV Rocephin 08/29/2019>>>> 09/02/2019  IV Flagyl 08/29/2019>>>>> 09/02/2019  Oral vancomycin 09/02/2019   Subjective: Patient just returned from colonoscopy.  Denies any chest pain.  Denies any significant abdominal pain.  Had bowel prep overnight and as such all stools were loose.  Feeling better than on admission.  Asking for diet.   Objective: Vitals:   09/02/19 0822 09/02/19 0835 09/02/19 0853 09/02/19 1208  BP: 122/84 112/77 113/72 109/74  Pulse: 88 85 78 75  Resp: '16 16 20 20  ' Temp: 98.5 F (36.9 C)  (!) 97.5 F (36.4 C) 98 F (36.7 C)  TempSrc: Axillary  Oral Oral  SpO2: 100% 93% 99% 99%  Weight:      Height:        Intake/Output Summary (Last 24 hours) at 09/02/2019 1234 Last data filed at 09/02/2019 1100 Gross per 24 hour  Intake 1599.98 ml  Output --  Net 1599.98 ml   Filed Weights   09/02/19 0706  Weight: 70.3 kg    Examination:  General exam: NAD Respiratory system: Lungs clear to auscultation bilaterally.  No wheezes, no crackles, no rhonchi.  Normal respiratory effort.  Cardiovascular system: RRR no murmurs rubs or gallops.  No JVD.  1-2+ bilateral lower extremity edema R> L (chronic per patient). Gastrointestinal system: Abdomen is soft, nontender, nondistended, positive bowel sounds.  No rebound.  No guarding.  Central nervous system: Alert and oriented. No focal neurological deficits. Extremities: Symmetric 5 x 5 power. Skin: No rashes, lesions or ulcers Psychiatry: Judgement and insight appear normal. Mood & affect appropriate.     Data Reviewed: I have personally reviewed following labs and imaging studies  CBC: Recent Labs  Lab 08/29/19 1151 08/30/19 0631 08/31/19 0852 09/01/19 0655 09/02/19 0936  WBC 18.2* 16.4* 22.3* 20.9*  25.3*  NEUTROABS  --   --  19.2* 18.0* 22.8*  HGB 15.0 14.1 12.8* 13.0 12.3*  HCT 45.6 43.1 37.6* 38.7* 37.7*  MCV 93.8 93.3 91.0 90.8 93.3  PLT 236 236 224 241 481    Basic Metabolic Panel: Recent Labs  Lab 08/29/19 1151 08/30/19 0631 08/31/19 0852 09/01/19 0655 09/02/19 0936  NA 135 140 136 135 138  K 4.2 4.1 3.4* 3.5 3.4*  CL 99 103 103 101 104  CO2 '27 22 22 23 23  ' GLUCOSE 122* 77 139* 104* 127*  BUN 11 9 <5* <5* 7*  CREATININE 0.74 0.79 0.64 0.60* 0.78  CALCIUM 8.2* 8.2* 7.7* 7.7* 7.5*  MG  --   --  1.8 1.9 1.8    GFR: Estimated Creatinine Clearance: 87.2 mL/min (by C-G formula based on SCr of 0.78 mg/dL).  Liver Function Tests: No results for input(s): AST, ALT, ALKPHOS, BILITOT, PROT, ALBUMIN in the last 168 hours.  CBG: No results for input(s): GLUCAP in the last 168 hours.   Recent Results (from the past 240 hour(s))  Gastrointestinal Panel by PCR ,  Stool     Status: None   Collection Time: 08/29/19  2:59 PM   Specimen: Stool  Result Value Ref Range Status   Campylobacter species NOT DETECTED NOT DETECTED Final   Plesimonas shigelloides NOT DETECTED NOT DETECTED Final   Salmonella species NOT DETECTED NOT DETECTED Final   Yersinia enterocolitica NOT DETECTED NOT DETECTED Final   Vibrio species NOT DETECTED NOT DETECTED Final   Vibrio cholerae NOT DETECTED NOT DETECTED Final   Enteroaggregative E coli (EAEC) NOT DETECTED NOT DETECTED Final   Enteropathogenic E coli (EPEC) NOT DETECTED NOT DETECTED Final   Enterotoxigenic E coli (ETEC) NOT DETECTED NOT DETECTED Final   Shiga like toxin producing E coli (STEC) NOT DETECTED NOT DETECTED Final   Shigella/Enteroinvasive E coli (EIEC) NOT DETECTED NOT DETECTED Final   Cryptosporidium NOT DETECTED NOT DETECTED Final   Cyclospora cayetanensis NOT DETECTED NOT DETECTED Final   Entamoeba histolytica NOT DETECTED NOT DETECTED Final   Giardia lamblia NOT DETECTED NOT DETECTED Final   Adenovirus F40/41 NOT DETECTED  NOT DETECTED Final   Astrovirus NOT DETECTED NOT DETECTED Final   Norovirus GI/GII NOT DETECTED NOT DETECTED Final   Rotavirus A NOT DETECTED NOT DETECTED Final   Sapovirus (I, II, IV, and V) NOT DETECTED NOT DETECTED Final    Comment: Performed at [facility], [address] [place], [place] [address]  Giardia/Cryptosporidium EIA     Status: None   Collection Time: 08/29/19  6:05 PM   Specimen: Stool  Result Value Ref Range Status   Giardia Ag, Stl Negative Negative Final   Cryptosporidium EIA Negative Negative Final    Comment: (NOTE) Performed At: [facility] [place], [place] [id number] Rush Farmer MD KP:[id number]    Source of Sample STOOL  Final    Comment: Performed at [facility], [place] [address], [place], [place] [address]  SARS Coronavirus 2 by RT PCR (hospital order, performed in [facility] hospital lab) Nasopharyngeal Nasopharyngeal Swab     Status: None   Collection Time: 08/29/19  8:54 PM   Specimen: Nasopharyngeal Swab  Result Value Ref Range Status   SARS Coronavirus 2 NEGATIVE NEGATIVE Final    Comment: (NOTE) SARS-CoV-2 target nucleic acids are NOT DETECTED.  The SARS-CoV-2 RNA is generally detectable in upper and lower respiratory specimens during the acute phase of infection. The lowest concentration of SARS-CoV-2 viral copies this assay can detect is 250 copies / mL. A negative result does not preclude SARS-CoV-2 infection and should not be used as the sole basis for treatment or other patient management decisions.  A negative result may occur with improper specimen collection / handling, submission of specimen other than nasopharyngeal swab, presence of viral mutation(s) within the areas targeted by this assay, and inadequate number of viral copies (<250 copies / mL). A negative result must be combined with clinical observations, patient history, and epidemiological information.  Fact Sheet for  Patients:   StrictlyIdeas.no  Fact Sheet for Healthcare Providers: BankingDealers.co.za  This test is not yet approved or  cleared by the [place] FDA and has been authorized for detection and/or diagnosis of SARS-CoV-2 by FDA under an Emergency Use Authorization (EUA).  This EUA will remain in effect (meaning this test can be used) for the duration of the COVID-19 declaration under Section 564(b)(1) of the Act, 21 U.S.C. section 360bbb-3(b)(1), unless the authorization is terminated or revoked sooner.  Performed at [facility], [address] [place],  [place] [address]          Radiology Studies: No results found.      Scheduled Meds: . baclofen  30 mg Oral BID  . cholecalciferol  2,000 Units Oral Daily  . clotrimazole-betamethasone  1 application Topical BID  . dalfampridine  10 mg Oral BID  . enoxaparin (LOVENOX) injection  40 mg Subcutaneous Q24H  . gabapentin  200 mg Oral QHS  . loratadine  10 mg Oral Daily  . multivitamin with minerals  1 tablet Oral Daily  . pantoprazole  40 mg Oral Daily  . saccharomyces boulardii  250 mg Oral Daily  . traMADol  50 mg Oral Q6H  . vancomycin  125 mg Oral Q6H   Continuous Infusions:    LOS: 4 days    Time spent: 35 minutes    Irine Seal, MD [facility]   To contact the attending provider between 7A-7P or the covering provider during after hours 7P-7A, please log into the web site [email or web address] and access using universal [place] password for that web site. If you do not have the password, please call the hospital operator.  09/02/2019, 12:34 PM

## 2019-09-02 NOTE — Progress Notes (Signed)
Based on findings of colonoscopy this am which appeared infectious rather than to be IBD ( ? C-diff) will stop Flagyl and treat empirically with oral Vanco. C-diff study this admission was cancelled as specimen was apparently too formed.

## 2019-09-02 NOTE — Anesthesia Postprocedure Evaluation (Signed)
Anesthesia Post Note  Patient: [name]  Procedure(s) Performed: COLONOSCOPY WITH PROPOFOL (N/A ) BIOPSY POLYPECTOMY     Patient location during evaluation: Endoscopy Anesthesia Type: MAC Level of consciousness: awake and alert, oriented and patient cooperative Pain management: pain level controlled Vital Signs Assessment: post-procedure vital signs reviewed and stable Respiratory status: spontaneous breathing, nonlabored ventilation and respiratory function stable Cardiovascular status: blood pressure returned to baseline and stable Postop Assessment: no apparent nausea or vomiting Anesthetic complications: no   No complications documented.  Last Vitals:  Vitals:   09/02/19 0835 09/02/19 0853  BP: 112/77 113/72  Pulse: 85 78  Resp: 16 20  Temp:  (!) 36.4 C  SpO2: 93% 99%    Last Pain:  Vitals:   09/02/19 0853  TempSrc: Oral  PainSc:                  Olanrewaju Osborn,E. Tyrin Herbers

## 2019-09-03 DIAGNOSIS — A09 Infectious gastroenteritis and colitis, unspecified: Secondary | ICD-10-CM

## 2019-09-03 LAB — CBC
HCT: 39.6 % (ref 39.0–52.0)
Hemoglobin: 13.2 g/dL (ref 13.0–17.0)
MCH: 30.3 pg (ref 26.0–34.0)
MCHC: 33.3 g/dL (ref 30.0–36.0)
MCV: 91 fL (ref 80.0–100.0)
Platelets: 267 10*3/uL (ref 150–400)
RBC: 4.35 MIL/uL (ref 4.22–5.81)
RDW: 13.2 % (ref 11.5–15.5)
WBC: 12.6 10*3/uL — ABNORMAL HIGH (ref 4.0–10.5)
nRBC: 0 % (ref 0.0–0.2)

## 2019-09-03 LAB — SURGICAL PATHOLOGY

## 2019-09-03 LAB — MAGNESIUM: Magnesium: 2.1 mg/dL (ref 1.7–2.4)

## 2019-09-03 NOTE — Progress Notes (Signed)
     [facility] Progress Note  CC:  Diarrhea  Subjective:  Feels great.  No diarrhea since he has been here.  Objective:  Vital signs in last 24 hours: Temp:  [98 F (36.7 C)-98.2 F (36.8 C)] 98.2 F (36.8 C) (07/28 0655) Pulse Rate:  [73-75] 73 (07/28 0655) Resp:  [15-20] 15 (07/28 0655) BP: (108-111)/(73-77) 108/73 (07/28 0655) SpO2:  [94 %-99 %] 94 % (07/28 0655) Last BM Date: 09/02/19 General:  Alert, Well-developed, in NAD Heart:  Regular rate and rhythm; no murmurs Pulm:  CTAB.  No increased WOB. Abdomen:  Soft, non-distended.  BS present.  Non-tender. Extremities:  Without edema. Neurologic:  Alert and oriented x 4;  grossly normal neurologically. Psych:  Alert and cooperative. Normal mood and affect.  Intake/Output from previous day: 07/27 0701 - 07/28 0700 In: 350 [I.V.:335; IV Piggyback:14.9] Out: -   Lab Results: Recent Labs    09/01/19 0655 09/02/19 0936  WBC 20.9* 25.3*  HGB 13.0 12.3*  HCT 38.7* 37.7*  PLT 241 217   BMET Recent Labs    09/01/19 0655 09/02/19 0936  NA 135 138  K 3.5 3.4*  CL 101 104  CO2 23 23  GLUCOSE 104* 127*  BUN <5* 7*  CREATININE 0.60* 0.78  CALCIUM 7.7* 7.5*   Assessment / Plan: Saksham Akkerman is a 64 y.o. male PMH significant for, but not necessarily limited to, multiple sclerosis, gallstone pancreatitis, OA.                                                                                                         # Diarrhea with occasional blood / descending colitis on CT scan --3 months duration --C-diff negative on 07/14/19 and negative GI pathogen panel (including giardia) this admission. C-diff specimen this admit was rejected by lab as stool was too formed.   --Transient improvement with empiric antibiotics x 2 . Also improvement with short course of pancreatic enzymes (no pmh or radiologic evidence of chronic pancreatitis).  --No further diarrhea since in the hospital. --Colonoscopy 7/27 with patchy  moderate inflammation throughout the entire colon.  Path pending, but suspicion was infectious.  Placed on vancomycin empirically 7/27. --Will recheck CBC today to be sure WBC count is coming down and if so then ok for discharge from GI standpoint.   LOS: 5 days   Laban Emperor. Lyndell Allaire  09/03/2019, 10:37 AM

## 2019-09-04 ENCOUNTER — Telehealth: Payer: Self-pay

## 2019-09-04 ENCOUNTER — Telehealth: Payer: Self-pay | Admitting: Gastroenterology

## 2019-09-04 NOTE — Telephone Encounter (Signed)
New message    The patient was d/c from [facility] on 7.28.21 was told antibiotic vancomycin would be prescribed to go home on. The patient was never given the prescription   [place], [address] DR AT [place]

## 2019-09-04 NOTE — Telephone Encounter (Signed)
Appt made to see [name] on 10/03/19 at 11 am.  The pt and his wife have been advised.

## 2019-09-04 NOTE — Telephone Encounter (Signed)
Pt's spouse is requesting to schedule the pt for a hospital follow up. Pt was admitted to [facility] on 7/23 where Dr Ardis Hughs saw him and requested to see him for a hospital follow up appt regarding his colitis inflammation. No appts available at this time.

## 2019-09-04 NOTE — Telephone Encounter (Signed)
Please advise 

## 2019-09-05 ENCOUNTER — Telehealth: Payer: Self-pay | Admitting: *Deleted

## 2019-09-05 MED ORDER — VANCOMYCIN HCL 125 MG PO CAPS: 125.0000 mg | ORAL_CAPSULE | Freq: Four times a day (QID) | ORAL | 0 refills | Status: DC

## 2019-09-05 NOTE — Discharge Summary (Signed)
Physician Discharge Summary  [name] FBP:[id number] DOB: Sep 07, 1955 DOA: 08/29/2019  PCP: Cassandria Anger, MD  Admit date: 08/29/2019 Discharge date: 09/05/2019  Recommendations for Outpatient Follow-up:  1. Discharge to home. 2. Follow up with PCP in 7-10 days. 3. Follow up with gastroenterology as directed.  Discharge Diagnoses: Principal diagnosis is #1 1. Acute colitis with mild sepsis without severe sepsis or septic shock, POA 2. Dehydration 3. Multiple sclerosis 4. GERD 5. Peripheral neuropathy 6. Hypokalemia  Discharge Condition: Fair  Disposition: Home  Diet recommendation: Heart healthy.  Filed Weights   09/02/19 0706  Weight: 70.3 kg   History of present illness: [name]  is a 64 y.o. Caucasian male with a known history of multiple sclerosis, asthma, pancreatitis and osteoarthritis, presented to the emergency room with a cancer of intermittent diarrhea over the last couple months for which he was given 2 courses of oral antibiotics on an outpatient basis.  For the last month and he finished his second course he started having recurrent diarrhea which has been mainly watery with mucus and rarely blood.  No melena.  No fever or chills.  He denies any nausea or vomiting.  He has been having crampy abdominal pain.  No chest pain or dyspnea or cough.  No dysuria oliguria or hematuria or flank pain.  Upon position to the emergency room, blood pressure was 137/82 with otherwise normal vital signs.  Labs revealed neck cytosis 18.2 with unremarkable BMP and urinalysis.  Abdominal pelvic CT scan revealed inflammatory or infectious colitis involving the descending colon with mild enlargement of the prostate.  The patient was given IV Cipro and Flagyl as well as 500 mill IV normal saline bolus followed by 125 mL/h.  He will be admitted to a medical bed for further evaluation and management.  [facility] were consulted to admit the patient for further  evaluation and treatment. Hospital Course:  The patient was admitted to a telemetry bed. Dr. Bryan Lemma for Gastroenterology was consulted. The patient's IV antibiotics (Cipro and Flagyl) were discontinued when his GI pathogen panel was returned negative. He was continued on oral vancomycin, IV fluids, and electrolyte supplementation. Stool was also sent for C Diff, but the sample was rejected by the lab as it was solid and not consistent with C. Diff. Throughout his stay the patient's diarrhea slowly improved, and his white blood cell count decreased. On the date of discharge the patient was eating and feeling well. I discussed the patient with Dr. Bryan Lemma. As we had no evidence of C Diff collitis, and the patient was having solid stools, he was discharged without antibiotics including no oral vancomycin. He will follow up with GI as outpatient.  Today's assessment: S: The patient is resting comfortably. No new complaints. O: Vitals:  Vitals:   09/02/19 2208 09/03/19 0655  BP: 111/77 108/73  Pulse: 74 73  Resp: 16 15  Temp: 98 F (36.7 C) 98.2 F (36.8 C)  SpO2: 94% 94%   Exam:  Constitutional:  . The patient is awake, alert, and oriented x 3. No acute distress. Respiratory:  . No increased work of breathing. . No wheezes, rales, or rhonchi . No tactile fremitus Cardiovascular:  . Regular rate and rhythm . No murmurs, ectopy, or gallups. . No lateral PMI. No thrills. Abdomen:  . Abdomen is soft, non-tender, non-distended . No hernias, masses, or organomegaly . Normoactive bowel sounds.  Musculoskeletal:  . No cyanosis, clubbing, or edema Skin:  . No rashes, lesions, ulcers .  palpation of skin: no induration or nodules Neurologic:  . CN 2-12 intact . Sensation all 4 extremities intact Psychiatric:  . Mental status o Mood, affect appropriate o Orientation to person, place, time  . judgment and insight appear intact  Discharge Instructions  Discharge Instructions     Activity as tolerated - No restrictions   Complete by: As directed    Call MD for:  persistant nausea and vomiting   Complete by: As directed    Call MD for:  severe uncontrolled pain   Complete by: As directed    Call MD for:  temperature >100.4   Complete by: As directed    Diet - low sodium heart healthy   Complete by: As directed    Discharge instructions   Complete by: As directed    Discharge to home. Follow up with PCP in 7-10 days. Follow up with gastroenterology as directed.   Increase activity slowly   Complete by: As directed      Allergies as of 09/03/2019      Reactions   Meperidine Other (See Comments)   Reaction:  Unknown    Prednisone Palpitations      Medication List    STOP taking these medications   ciprofloxacin 500 MG tablet Commonly known as: Cipro   ibuprofen 600 MG tablet Commonly known as: ADVIL   ketorolac 10 MG tablet Commonly known as: TORADOL   metroNIDAZOLE 500 MG tablet Commonly known as: Flagyl   Zenpep 25000-79000 units Cpep Generic drug: Pancrelipase (Lip-Prot-Amyl)     TAKE these medications   amitriptyline 25 MG tablet Commonly known as: ELAVIL TAKE 2 TABLETS AT BEDTIME AS NEEDED FOR SLEEP (OFFICE VISIT NEEDED BD REFILLS WILL BE GIVEN) What changed: See the new instructions.   baclofen 20 MG tablet Commonly known as: LIORESAL TAKE 1 TABLET FOUR TIMES A DAY AS NEEDED FOR MUSCLE SPASMS (OFFICE VISIT NEEDED BEFORE REFILLS WILL BE GIVEN) What changed: See the new instructions.   cetirizine 10 MG tablet Commonly known as: ZYRTEC Take 10 mg by mouth daily as needed for allergies.   cholecalciferol 1000 units tablet Commonly known as: VITAMIN D Take 2,000 Units by mouth daily.   clotrimazole-betamethasone cream Commonly known as: LOTRISONE APPLY 1 APPLICATION TOPICALLY TWICE A DAY AS NEEDED FOR IRRITATION What changed: See the new instructions.   dalfampridine 10 MG Tb12 Commonly known as: Ampyra Take 1 tablet (10  mg total) by mouth 2 (two) times daily.   diphenoxylate-atropine 2.5-0.025 MG tablet Commonly known as: Lomotil Take 1 tablet by mouth 4 (four) times daily as needed for diarrhea or loose stools.   fluticasone 50 MCG/ACT nasal spray Commonly known as: FLONASE Place 2 sprays into both nostrils daily as needed for allergies or rhinitis.   gabapentin 100 MG capsule Commonly known as: NEURONTIN TAKE 2 CAPSULES THREE TIMES A DAY AS NEEDED FOR PAIN What changed: See the new instructions.   multivitamin with minerals Tabs tablet Take 1 tablet by mouth daily.   ondansetron 4 MG disintegrating tablet Commonly known as: Zofran ODT Take 1 tablet (4 mg total) by mouth every 8 (eight) hours as needed for nausea or vomiting.   pantoprazole 40 MG tablet Commonly known as: PROTONIX TAKE 1 TABLET DAILY   saccharomyces boulardii 250 MG capsule Commonly known as: Florastor Take 1 capsule (250 mg total) by mouth 2 (two) times daily. What changed: when to take this   TraMADol HCl 200 MG Cp24 Take 1 capsule by mouth daily. What  changed: Another medication with the same name was removed. Continue taking this medication, and follow the directions you see here.      Allergies  Allergen Reactions  . Meperidine Other (See Comments)    Reaction:  Unknown   . Prednisone Palpitations    The results of significant diagnostics from this hospitalization (including imaging, microbiology, ancillary and laboratory) are listed below for reference.    Significant Diagnostic Studies: CT ABDOMEN PELVIS W CONTRAST  Result Date: 08/29/2019 CLINICAL DATA:  Diarrhea for 3 months EXAM: CT ABDOMEN AND PELVIS WITH CONTRAST TECHNIQUE: Multidetector CT imaging of the abdomen and pelvis was performed using the standard protocol following bolus administration of intravenous contrast. CONTRAST:  133mL OMNIPAQUE IOHEXOL 300 MG/ML  SOLN COMPARISON:  01/31/2018 FINDINGS: Lower chest: No acute pleural or parenchymal lung  disease. Hepatobiliary: Gallbladder is surgically absent. Left lobe of the liver is atrophic, a chronic finding. Remainder of the liver is unremarkable. Pancreas: Unremarkable. No pancreatic ductal dilatation or surrounding inflammatory changes. Spleen: Normal in size without focal abnormality. Adrenals/Urinary Tract: Stable right renal cysts. Kidneys otherwise unremarkable. Bladder is moderately distended with no focal abnormality. The adrenals are normal. Stomach/Bowel: No bowel obstruction or ileus. There is mild wall thickening of the descending colon from the splenic flexure through the rectosigmoid junction, with mild pericolonic fat stranding. Findings are consistent with inflammatory or infectious colitis. Vascular/Lymphatic: No significant vascular findings are present. No enlarged abdominal or pelvic lymph nodes. Reproductive: Mild enlargement of the prostate, stable. Other: No abdominal wall hernia or abnormality. No abdominopelvic ascites. Musculoskeletal: No acute or destructive bony lesions. Reconstructed images demonstrate no additional findings. IMPRESSION: 1. Inflammatory or infectious colitis involving the descending colon. 2. Stable mild enlargement of the prostate. Electronically Signed   By: Randa Ngo M.D.   On: 08/29/2019 18:58    Microbiology: Recent Results (from the past 240 hour(s))  Gastrointestinal Panel by PCR , Stool     Status: None   Collection Time: 08/29/19  2:59 PM   Specimen: Stool  Result Value Ref Range Status   Campylobacter species NOT DETECTED NOT DETECTED Final   Plesimonas shigelloides NOT DETECTED NOT DETECTED Final   Salmonella species NOT DETECTED NOT DETECTED Final   Yersinia enterocolitica NOT DETECTED NOT DETECTED Final   Vibrio species NOT DETECTED NOT DETECTED Final   Vibrio cholerae NOT DETECTED NOT DETECTED Final   Enteroaggregative E coli (EAEC) NOT DETECTED NOT DETECTED Final   Enteropathogenic E coli (EPEC) NOT DETECTED NOT DETECTED Final    Enterotoxigenic E coli (ETEC) NOT DETECTED NOT DETECTED Final   Shiga like toxin producing E coli (STEC) NOT DETECTED NOT DETECTED Final   Shigella/Enteroinvasive E coli (EIEC) NOT DETECTED NOT DETECTED Final   Cryptosporidium NOT DETECTED NOT DETECTED Final   Cyclospora cayetanensis NOT DETECTED NOT DETECTED Final   Entamoeba histolytica NOT DETECTED NOT DETECTED Final   Giardia lamblia NOT DETECTED NOT DETECTED Final   Adenovirus F40/41 NOT DETECTED NOT DETECTED Final   Astrovirus NOT DETECTED NOT DETECTED Final   Norovirus GI/GII NOT DETECTED NOT DETECTED Final   Rotavirus A NOT DETECTED NOT DETECTED Final   Sapovirus (I, II, IV, and V) NOT DETECTED NOT DETECTED Final    Comment: Performed at [facility], [address] [place], [place] [address]  Giardia/Cryptosporidium EIA     Status: None   Collection Time: 08/29/19  6:05 PM   Specimen: Stool  Result Value Ref Range Status   Giardia Ag, Stl Negative Negative Final  Cryptosporidium EIA Negative Negative Final    Comment: (NOTE) Performed At: [facility] [place], [place] [id number] Rush Farmer MD WG:[id number]    Source of Sample STOOL  Final    Comment: Performed at [facility], [place] [address] [place], [place] [address]  SARS Coronavirus 2 by RT PCR (hospital order, performed in [facility] hospital lab) Nasopharyngeal Nasopharyngeal Swab     Status: None   Collection Time: 08/29/19  8:54 PM   Specimen: Nasopharyngeal Swab  Result Value Ref Range Status   SARS Coronavirus 2 NEGATIVE NEGATIVE Final    Comment: (NOTE) SARS-CoV-2 target nucleic acids are NOT DETECTED.  The SARS-CoV-2 RNA is generally detectable in upper and lower respiratory specimens during the acute phase of infection. The lowest concentration of SARS-CoV-2 viral copies this assay can detect is 250 copies / mL. A negative result does not preclude SARS-CoV-2 infection and should not be used as  the sole basis for treatment or other patient management decisions.  A negative result may occur with improper specimen collection / handling, submission of specimen other than nasopharyngeal swab, presence of viral mutation(s) within the areas targeted by this assay, and inadequate number of viral copies (<250 copies / mL). A negative result must be combined with clinical observations, patient history, and epidemiological information.  Fact Sheet for Patients:   StrictlyIdeas.no  Fact Sheet for Healthcare Providers: BankingDealers.co.za  This test is not yet approved or  cleared by the [place] FDA and has been authorized for detection and/or diagnosis of SARS-CoV-2 by FDA under an Emergency Use Authorization (EUA).  This EUA will remain in effect (meaning this test can be used) for the duration of the COVID-19 declaration under Section 564(b)(1) of the Act, 21 U.S.C. section 360bbb-3(b)(1), unless the authorization is terminated or revoked sooner.  Performed at [facility], [place] [address] [place], [place] [address]      Labs: Basic Metabolic Panel: Recent Labs  Lab 08/30/19 0631 08/31/19 8413 09/01/19 0655 09/02/19 0936 09/03/19 0544  NA 140 136 135 138  --   K 4.1 3.4* 3.5 3.4*  --   CL 103 103 101 104  --   CO2 22 22 23 23   --   GLUCOSE 77 139* 104* 127*  --   BUN 9 <5* <5* 7*  --   CREATININE 0.79 0.64 0.60* 0.78  --   CALCIUM 8.2* 7.7* 7.7* 7.5*  --   MG  --  1.8 1.9 1.8 2.1   Liver Function Tests: No results for input(s): AST, ALT, ALKPHOS, BILITOT, PROT, ALBUMIN in the last 168 hours. No results for input(s): LIPASE, AMYLASE in the last 168 hours. No results for input(s): AMMONIA in the last 168 hours. CBC: Recent Labs  Lab 08/30/19 0631 08/31/19 0852 09/01/19 0655 09/02/19 0936 09/03/19 1145  WBC 16.4* 22.3* 20.9* 25.3* 12.6*  NEUTROABS  --  19.2* 18.0* 22.8*  --   HGB 14.1  12.8* 13.0 12.3* 13.2  HCT 43.1 37.6* 38.7* 37.7* 39.6  MCV 93.3 91.0 90.8 93.3 91.0  PLT 236 224 241 217 267   Cardiac Enzymes: No results for input(s): CKTOTAL, CKMB, CKMBINDEX, TROPONINI in the last 168 hours. BNP: BNP (last 3 results) No results for input(s): BNP in the last 8760 hours.  ProBNP (last 3 results) No results for input(s): PROBNP in the last 8760 hours.  CBG: No results for input(s): GLUCAP in the last 168 hours.  Principal Problem:   Acute  colitis Active Problems:   Multiple sclerosis (HCC)   Paresthesia   GERD (gastroesophageal reflux disease)   Peripheral neuropathy   Chronic pancreatitis (HCC)   Abnormal CT scan, colon   Benign neoplasm of cecum   Hematochezia   Infectious colitis  Time coordinating discharge: 48 minutes.  Signed:        Cris Gibby, DO [facility]  09/05/2019, 12:50 PM

## 2019-09-05 NOTE — Telephone Encounter (Signed)
Okay.  I am sorry.  I will send a prescription.  Thanks

## 2019-09-05 NOTE — Telephone Encounter (Signed)
Transition Care Management Follow-up Telephone Call   Date discharged? 09/04/19   How have you been since you were released from the hospital? Spoke w/pt wife she states he is doing ok   Do you understand why you were in the hospital? YES   Do you understand the discharge instructions? YES   Where were you discharged to? Home   Items Reviewed:  Medications reviewed: YES, she states the MD at hospital did not send rx for antibiotic, but Dr. Alain Marion sent it today  Allergies reviewed: YES  Dietary changes reviewed: YES  Referrals reviewed: YES   Functional Questionnaire:   Activities of Daily Living (ADLs):   She states he are independent in the following: ambulation, bathing and hygiene, feeding, continence, grooming, toileting and dressing States he doesn't require assistance    Any transportation issues/concerns?: NO   Any patient concerns? NO   Confirmed importance and date/time of follow-up visits scheduled YES, appt 09/10/19  Provider Appointment booked with Dr. Alain Marion  Confirmed with patient if condition begins to worsen call PCP or go to the ER.  Patient was given the office number and encouraged to call back with question or concerns.  : YES .

## 2019-09-08 NOTE — Telephone Encounter (Signed)
See med refill.

## 2019-09-10 ENCOUNTER — Encounter: Payer: Self-pay | Admitting: Internal Medicine

## 2019-09-10 ENCOUNTER — Other Ambulatory Visit: Payer: Self-pay

## 2019-09-10 ENCOUNTER — Ambulatory Visit (INDEPENDENT_AMBULATORY_CARE_PROVIDER_SITE_OTHER): Payer: Medicare Other | Admitting: Internal Medicine

## 2019-09-10 VITALS — BP 128/84 | HR 88 | Temp 98.4°F | Ht 67.0 in | Wt 155.0 lb

## 2019-09-10 DIAGNOSIS — G35 Multiple sclerosis: Secondary | ICD-10-CM | POA: Diagnosis not present

## 2019-09-10 DIAGNOSIS — K529 Noninfective gastroenteritis and colitis, unspecified: Secondary | ICD-10-CM

## 2019-09-10 DIAGNOSIS — A09 Infectious gastroenteritis and colitis, unspecified: Secondary | ICD-10-CM

## 2019-09-10 DIAGNOSIS — R151 Fecal smearing: Secondary | ICD-10-CM

## 2019-09-10 DIAGNOSIS — R5381 Other malaise: Secondary | ICD-10-CM

## 2019-09-10 DIAGNOSIS — N3941 Urge incontinence: Secondary | ICD-10-CM | POA: Insufficient documentation

## 2019-09-10 DIAGNOSIS — R159 Full incontinence of feces: Secondary | ICD-10-CM | POA: Insufficient documentation

## 2019-09-10 MED ORDER — TADALAFIL 5 MG PO TABS: 5.0000 mg | ORAL_TABLET | Freq: Every day | ORAL | 11 refills | Status: DC

## 2019-09-10 NOTE — Assessment & Plan Note (Addendum)
Depends for now BPH  Try Cialis 5 mg/day if not better

## 2019-09-10 NOTE — Assessment & Plan Note (Signed)
C diff - on po Vancomycin now

## 2019-09-10 NOTE — Assessment & Plan Note (Signed)
Better Finish po Vanco

## 2019-09-10 NOTE — Progress Notes (Signed)
Subjective:  Patient ID: [name], male    DOB: 12/09/1955  Age: 64 y.o. MRN: [id number]  CC: No chief complaint on file.   HPI [name] presents for C diff colitis - s/p hosp stay for C diff colitis. D/c'd on 7/30. No diarrhea now. Stools several a day, mild incontinence. Lost wt... Taking Vancomycin. No BM at night. C/o urinary incontinence x 2 weeks. Per hx:   "Admit date: 08/29/2019 Discharge date: 09/05/2019  Recommendations for Outpatient Follow-up:  1. Discharge to home. 2. Follow up with PCP in 7-10 days. 3. Follow up with gastroenterology as directed.  Discharge Diagnoses: Principal diagnosis is #1 1. Acute colitis with mild sepsis without severe sepsis or septic shock, POA 2. Dehydration 3. Multiple sclerosis 4. GERD 5. Peripheral neuropathy 6. Hypokalemia  Discharge Condition: Fair  Disposition: Home  Diet recommendation: Heart healthy.     Filed Weights   09/02/19 0706  Weight: 70.3 kg   History of present illness: [name] y.o.Caucasian malewith a known history ofmultiple sclerosis, asthma, pancreatitis and osteoarthritis, presented to the emergency room with a cancer of intermittent diarrhea over the last couple months for which he was given 2 courses of oral antibiotics on an outpatient basis. For the last month and he finished his second course he started having recurrent diarrhea which has been mainly watery with mucus and rarely blood. No melena. No fever or chills. He denies any nausea or vomiting. He has been having crampy abdominal pain. No chest pain or dyspnea or cough. No dysuria oliguria or hematuria or flank pain.  Upon position to the emergency room, blood pressure was 137/82 with otherwise normal vital signs. Labs revealed neck cytosis 18.2 with unremarkable BMP and urinalysis. Abdominal pelvic CT scan revealed inflammatory or infectious colitis involving the descending colon with mild enlargement of the  prostate.  The patient was given IV Cipro and Flagyl as well as 500 mill IV normal saline bolus followed by 125 mL/h. He will be admitted to a medical bed for further evaluation and management.  [facility] were consulted to admit the patient for further evaluation and treatment. Hospital Course:  The patient was admitted to a telemetry bed. Dr. Bryan Lemma for Gastroenterology was consulted. The patient's IV antibiotics (Cipro and Flagyl) were discontinued when his GI pathogen panel was returned negative. He was continued on oral vancomycin, IV fluids, and electrolyte supplementation. Stool was also sent for C Diff, but the sample was rejected by the lab as it was solid and not consistent with C. Diff. Throughout his stay the patient's diarrhea slowly improved, and his white blood cell count decreased. On the date of discharge the patient was eating and feeling well. I discussed the patient with Dr. Bryan Lemma. As we had no evidence of C Diff collitis, and the patient was having solid stools, he was discharged without antibiotics including no oral vancomycin. He will follow up with GI as outpatient.  Today's assessment: S: The patient is resting comfortably. No new complaints. O: Vitals:      Vitals:   09/02/19 2208 09/03/19 0655  BP: 111/77 108/73  Pulse: 74 73  Resp: 16 15  Temp: 98 F (36.7 C) 98.2 F (36.8 C)  SpO2: 94% 94%   Exam:  Constitutional:   The patient is awake, alert, and oriented x 3. No acute distress. Respiratory:   No increased work of breathing.  No wheezes, rales, or rhonchi  No tactile fremitus Cardiovascular:   Regular rate and rhythm  No murmurs, ectopy, or gallups.  No lateral PMI. No thrills. Abdomen:   Abdomen is soft, non-tender, non-distended  No hernias, masses, or organomegaly  Normoactive bowel sounds.  Musculoskeletal:   No cyanosis, clubbing, or edema Skin:   No rashes, lesions, ulcers  palpation of skin: no  induration or nodules Neurologic:   CN 2-12 intact  Sensation all 4 extremities intact Psychiatric:   Mental status ? Mood, affect appropriate ? Orientation to person, place, time   judgment and insight appear intact  Discharge Instructions      Discharge Instructions    Activity as tolerated - No restrictions   Complete by: As directed    Call MD for:  persistant nausea and vomiting   Complete by: As directed    Call MD for:  severe uncontrolled pain   Complete by: As directed    Call MD for:  temperature >100.4   Complete by: As directed    Diet - low sodium heart healthy   Complete by: As directed    Discharge instructions   Complete by: As directed    Discharge to home. Follow up with PCP in 7-10 days. Follow up with gastroenterology as directed.   Increase activity slowly      " Outpatient Medications Prior to Visit  Medication Sig Dispense Refill  . amitriptyline (ELAVIL) 25 MG tablet TAKE 2 TABLETS AT BEDTIME AS NEEDED FOR SLEEP (OFFICE VISIT NEEDED BD REFILLS WILL BE GIVEN) (Patient taking differently: Take 50 mg by mouth at bedtime. ) 180 tablet 3  . baclofen (LIORESAL) 20 MG tablet TAKE 1 TABLET FOUR TIMES A DAY AS NEEDED FOR MUSCLE SPASMS (OFFICE VISIT NEEDED BEFORE REFILLS WILL BE GIVEN) (Patient taking differently: Take 30 mg by mouth 2 (two) times daily. ) 360 tablet 0  . cetirizine (ZYRTEC) 10 MG tablet Take 10 mg by mouth daily as needed for allergies.     . cholecalciferol (VITAMIN D) 1000 units tablet Take 2,000 Units by mouth daily.     . clotrimazole-betamethasone (LOTRISONE) cream APPLY 1 APPLICATION TOPICALLY TWICE A DAY AS NEEDED FOR IRRITATION (Patient taking differently: Apply 1 application topically 2 (two) times daily. ) 30 g 2  . dalfampridine (AMPYRA) 10 MG TB12 Take 1 tablet (10 mg total) by mouth 2 (two) times daily. 180 tablet 3  . fluticasone (FLONASE) 50 MCG/ACT nasal spray Place 2 sprays into both nostrils daily as needed  for allergies or rhinitis.     [name] gabapentin (NEURONTIN) 100 MG capsule TAKE 2 CAPSULES THREE TIMES A DAY AS NEEDED FOR PAIN (Patient taking differently: Take 200 mg by mouth at bedtime. ) 360 capsule 5  . Multiple Vitamin (MULTIVITAMIN WITH MINERALS) TABS tablet Take 1 tablet by mouth daily.    . ondansetron (ZOFRAN ODT) 4 MG disintegrating tablet Take 1 tablet (4 mg total) by mouth every 8 (eight) hours as needed for nausea or vomiting. 20 tablet 0  . pantoprazole (PROTONIX) 40 MG tablet TAKE 1 TABLET DAILY 90 tablet 3  . TraMADol HCl 200 MG CP24 Take 1 capsule by mouth daily. 90 capsule 1  . vancomycin (VANCOCIN HCL) 125 MG capsule Take 1 capsule (125 mg total) by mouth 4 (four) times daily. 40 capsule 0  . diphenoxylate-atropine (LOMOTIL) 2.5-0.025 MG tablet Take 1 tablet by mouth 4 (four) times daily as needed for diarrhea or loose stools. (Patient not taking: Reported on 09/10/2019) 40 tablet 0  . saccharomyces boulardii (FLORASTOR) 250 MG capsule Take 1 capsule (250 mg  total) by mouth 2 (two) times daily. (Patient not taking: Reported on 09/10/2019) 60 capsule 0   No facility-administered medications prior to visit.    ROS: Review of Systems  Constitutional: Negative for appetite change, fatigue and unexpected weight change.  HENT: Negative for congestion, nosebleeds, sneezing, sore throat and trouble swallowing.   Eyes: Negative for itching and visual disturbance.  Respiratory: Negative for cough.   Cardiovascular: Negative for chest pain, palpitations and leg swelling.  Gastrointestinal: Positive for diarrhea. Negative for abdominal distention, blood in stool and nausea.  Genitourinary: Positive for difficulty urinating. Negative for discharge, frequency and hematuria.  Musculoskeletal: Positive for gait problem. Negative for back pain, joint swelling and neck pain.  Skin: Negative for rash.  Neurological: Negative for dizziness, tremors, speech difficulty and weakness.    Psychiatric/Behavioral: Negative for agitation, dysphoric mood and sleep disturbance. The patient is not nervous/anxious.     Objective:  BP 128/84 (BP Location: Right Arm, Patient Position: Sitting, Cuff Size: Normal)   Pulse 88   Temp 98.4 F (36.9 C) (Oral)   Ht 5\' 7"  (1.702 m)   Wt 155 lb (70.3 kg)   SpO2 97%   BMI 24.28 kg/m   BP Readings from Last 3 Encounters:  09/10/19 128/84  09/03/19 108/73  06/05/19 136/86    Wt Readings from Last 3 Encounters:  09/10/19 155 lb (70.3 kg)  09/02/19 155 lb (70.3 kg)  06/05/19 166 lb (75.3 kg)    Physical Exam  Lab Results  Component Value Date   WBC 12.6 (H) 09/03/2019   HGB 13.2 09/03/2019   HCT 39.6 09/03/2019   PLT 267 09/03/2019   GLUCOSE 127 (H) 09/02/2019   CHOL 130 07/11/2019   TRIG 49.0 07/11/2019   HDL 39.40 07/11/2019   LDLDIRECT 118.4 06/09/2009   LDLCALC 81 07/11/2019   ALT 15 07/11/2019   AST 19 07/11/2019   NA 138 09/02/2019   K 3.4 (L) 09/02/2019   CL 104 09/02/2019   CREATININE 0.78 09/02/2019   BUN 7 (L) 09/02/2019   CO2 23 09/02/2019   TSH 3.05 07/11/2019   PSA 3.78 07/11/2019   INR 1.0 08/24/2008   HGBA1C 5.8 07/11/2019    CT ABDOMEN PELVIS W CONTRAST  Result Date: 08/29/2019 CLINICAL DATA:  Diarrhea for 3 months EXAM: CT ABDOMEN AND PELVIS WITH CONTRAST TECHNIQUE: Multidetector CT imaging of the abdomen and pelvis was performed using the standard protocol following bolus administration of intravenous contrast. CONTRAST:  163mL OMNIPAQUE IOHEXOL 300 MG/ML  SOLN COMPARISON:  01/31/2018 FINDINGS: Lower chest: No acute pleural or parenchymal lung disease. Hepatobiliary: Gallbladder is surgically absent. Left lobe of the liver is atrophic, a chronic finding. Remainder of the liver is unremarkable. Pancreas: Unremarkable. No pancreatic ductal dilatation or surrounding inflammatory changes. Spleen: Normal in size without focal abnormality. Adrenals/Urinary Tract: Stable right renal cysts. Kidneys  otherwise unremarkable. Bladder is moderately distended with no focal abnormality. The adrenals are normal. Stomach/Bowel: No bowel obstruction or ileus. There is mild wall thickening of the descending colon from the splenic flexure through the rectosigmoid junction, with mild pericolonic fat stranding. Findings are consistent with inflammatory or infectious colitis. Vascular/Lymphatic: No significant vascular findings are present. No enlarged abdominal or pelvic lymph nodes. Reproductive: Mild enlargement of the prostate, stable. Other: No abdominal wall hernia or abnormality. No abdominopelvic ascites. Musculoskeletal: No acute or destructive bony lesions. Reconstructed images demonstrate no additional findings. IMPRESSION: 1. Inflammatory or infectious colitis involving the descending colon. 2. Stable mild enlargement of the  prostate. Electronically Signed   By: Randa Ngo M.D.   On: 08/29/2019 18:58    Assessment & Plan:     Follow-up: No follow-ups on file.  Walker Kehr, MD

## 2019-09-10 NOTE — Patient Instructions (Signed)
Imodium AD Metamucil

## 2019-09-10 NOTE — Assessment & Plan Note (Signed)
Depends for now Imodium, Metamucil F/u w/GI

## 2019-09-14 DIAGNOSIS — K219 Gastro-esophageal reflux disease without esophagitis: Secondary | ICD-10-CM | POA: Diagnosis not present

## 2019-09-14 DIAGNOSIS — Z9181 History of falling: Secondary | ICD-10-CM | POA: Diagnosis not present

## 2019-09-14 DIAGNOSIS — G629 Polyneuropathy, unspecified: Secondary | ICD-10-CM | POA: Diagnosis not present

## 2019-09-14 DIAGNOSIS — R32 Unspecified urinary incontinence: Secondary | ICD-10-CM | POA: Diagnosis not present

## 2019-09-14 DIAGNOSIS — G35 Multiple sclerosis: Secondary | ICD-10-CM | POA: Diagnosis not present

## 2019-09-15 ENCOUNTER — Telehealth: Payer: Self-pay

## 2019-09-15 NOTE — Telephone Encounter (Signed)
Key: [id number]

## 2019-09-17 DIAGNOSIS — K219 Gastro-esophageal reflux disease without esophagitis: Secondary | ICD-10-CM | POA: Diagnosis not present

## 2019-09-17 DIAGNOSIS — G629 Polyneuropathy, unspecified: Secondary | ICD-10-CM | POA: Diagnosis not present

## 2019-09-17 DIAGNOSIS — R32 Unspecified urinary incontinence: Secondary | ICD-10-CM | POA: Diagnosis not present

## 2019-09-17 DIAGNOSIS — Z9181 History of falling: Secondary | ICD-10-CM | POA: Diagnosis not present

## 2019-09-17 DIAGNOSIS — G35 Multiple sclerosis: Secondary | ICD-10-CM | POA: Diagnosis not present

## 2019-09-18 DIAGNOSIS — K219 Gastro-esophageal reflux disease without esophagitis: Secondary | ICD-10-CM | POA: Diagnosis not present

## 2019-09-18 DIAGNOSIS — G35 Multiple sclerosis: Secondary | ICD-10-CM | POA: Diagnosis not present

## 2019-09-18 DIAGNOSIS — R32 Unspecified urinary incontinence: Secondary | ICD-10-CM | POA: Diagnosis not present

## 2019-09-18 DIAGNOSIS — G629 Polyneuropathy, unspecified: Secondary | ICD-10-CM | POA: Diagnosis not present

## 2019-09-18 DIAGNOSIS — Z9181 History of falling: Secondary | ICD-10-CM | POA: Diagnosis not present

## 2019-09-22 ENCOUNTER — Other Ambulatory Visit: Payer: Medicare Other

## 2019-09-22 DIAGNOSIS — A09 Infectious gastroenteritis and colitis, unspecified: Secondary | ICD-10-CM

## 2019-09-22 LAB — COMPLETE METABOLIC PANEL WITH GFR
AG Ratio: 1.6 (calc) (ref 1.0–2.5)
ALT: 29 U/L (ref 9–46)
AST: 22 U/L (ref 10–35)
Albumin: 3.6 g/dL (ref 3.6–5.1)
Alkaline phosphatase (APISO): 66 U/L (ref 35–144)
BUN: 12 mg/dL (ref 7–25)
CO2: 32 mmol/L (ref 20–32)
Calcium: 8.6 mg/dL (ref 8.6–10.3)
Chloride: 103 mmol/L (ref 98–110)
Creat: 0.75 mg/dL (ref 0.70–1.25)
GFR, Est African American: 112 mL/min/{1.73_m2} (ref >=60)
GFR, Est Non African American: 97 mL/min/{1.73_m2} (ref >=60)
Globulin: 2.3 g/dL (calc) (ref 1.9–3.7)
Glucose, Bld: 99 mg/dL (ref 65–99)
Potassium: 4.4 mmol/L (ref 3.5–5.3)
Sodium: 140 mmol/L (ref 135–146)
Total Bilirubin: 0.6 mg/dL (ref 0.2–1.2)
Total Protein: 5.9 g/dL — ABNORMAL LOW (ref 6.1–8.1)

## 2019-09-22 LAB — CBC WITH DIFFERENTIAL/PLATELET
Absolute Monocytes: 503 cells/uL (ref 200–950)
Basophils Absolute: 42 cells/uL (ref 0–200)
Basophils Relative: 0.9 %
Eosinophils Absolute: 113 cells/uL (ref 15–500)
Eosinophils Relative: 2.4 %
HCT: 39.7 % (ref 38.5–50.0)
Hemoglobin: 13.1 g/dL — ABNORMAL LOW (ref 13.2–17.1)
Lymphs Abs: 855 cells/uL (ref 850–3900)
MCH: 30.6 pg (ref 27.0–33.0)
MCHC: 33 g/dL (ref 32.0–36.0)
MCV: 92.8 fL (ref 80.0–100.0)
MPV: 11.6 fL (ref 7.5–12.5)
Monocytes Relative: 10.7 %
Neutro Abs: 3187 cells/uL (ref 1500–7800)
Neutrophils Relative %: 67.8 %
Platelets: 205 10*3/uL (ref 140–400)
RBC: 4.28 10*6/uL (ref 4.20–5.80)
RDW: 13.1 % (ref 11.0–15.0)
Total Lymphocyte: 18.2 %
WBC: 4.7 10*3/uL (ref 3.8–10.8)

## 2019-09-23 DIAGNOSIS — G35 Multiple sclerosis: Secondary | ICD-10-CM | POA: Diagnosis not present

## 2019-09-23 DIAGNOSIS — Z9181 History of falling: Secondary | ICD-10-CM | POA: Diagnosis not present

## 2019-09-23 DIAGNOSIS — K219 Gastro-esophageal reflux disease without esophagitis: Secondary | ICD-10-CM | POA: Diagnosis not present

## 2019-09-23 DIAGNOSIS — R32 Unspecified urinary incontinence: Secondary | ICD-10-CM | POA: Diagnosis not present

## 2019-09-23 DIAGNOSIS — G629 Polyneuropathy, unspecified: Secondary | ICD-10-CM | POA: Diagnosis not present

## 2019-09-29 DIAGNOSIS — Z23 Encounter for immunization: Secondary | ICD-10-CM | POA: Diagnosis not present

## 2019-09-30 DIAGNOSIS — R32 Unspecified urinary incontinence: Secondary | ICD-10-CM | POA: Diagnosis not present

## 2019-09-30 DIAGNOSIS — G629 Polyneuropathy, unspecified: Secondary | ICD-10-CM | POA: Diagnosis not present

## 2019-09-30 DIAGNOSIS — K219 Gastro-esophageal reflux disease without esophagitis: Secondary | ICD-10-CM | POA: Diagnosis not present

## 2019-09-30 DIAGNOSIS — G35 Multiple sclerosis: Secondary | ICD-10-CM | POA: Diagnosis not present

## 2019-09-30 DIAGNOSIS — Z9181 History of falling: Secondary | ICD-10-CM | POA: Diagnosis not present

## 2019-10-01 DIAGNOSIS — G35 Multiple sclerosis: Secondary | ICD-10-CM | POA: Diagnosis not present

## 2019-10-01 DIAGNOSIS — K219 Gastro-esophageal reflux disease without esophagitis: Secondary | ICD-10-CM | POA: Diagnosis not present

## 2019-10-01 DIAGNOSIS — Z9181 History of falling: Secondary | ICD-10-CM | POA: Diagnosis not present

## 2019-10-01 DIAGNOSIS — R32 Unspecified urinary incontinence: Secondary | ICD-10-CM | POA: Diagnosis not present

## 2019-10-01 DIAGNOSIS — G629 Polyneuropathy, unspecified: Secondary | ICD-10-CM | POA: Diagnosis not present

## 2019-10-03 ENCOUNTER — Ambulatory Visit (INDEPENDENT_AMBULATORY_CARE_PROVIDER_SITE_OTHER): Payer: Medicare Other | Admitting: Gastroenterology

## 2019-10-03 ENCOUNTER — Encounter: Payer: Self-pay | Admitting: Gastroenterology

## 2019-10-03 VITALS — BP 122/86 | HR 96 | Ht 67.5 in | Wt 153.2 lb

## 2019-10-03 DIAGNOSIS — R197 Diarrhea, unspecified: Secondary | ICD-10-CM

## 2019-10-03 DIAGNOSIS — A09 Infectious gastroenteritis and colitis, unspecified: Secondary | ICD-10-CM

## 2019-10-03 NOTE — Progress Notes (Signed)
10/03/2019 [name] [id number] [birth year]   HISTORY OF PRESENT ILLNESS:  This is a 64 year old male who is here for hospital follow-up of his diarrhea/colitis.  Had diarrhea for 3 months duration.  C-diff negative on 07/14/19 and negative GI pathogen panel (including giardia) during hospital admission. C-diff specimen this admit was rejected by lab as stool was too formed.  Had transient improvement with empiric antibiotics x 2 as outpatient during all of this. Also improvement with short course of pancreatic enzymes (no pmh or radiologic evidence of chronic pancreatitis).  Eventually diarrhea was persistent so he was admitted to the hospital for evaluation.  Colonoscopy 7/27 with patchy moderate inflammation throughout the entire colon.  Placed on vancomycin empirically 7/27.  Biopsies showed active colitis with minimal architectural distortion in the right/left colon.  No granulomata.  Highest suspicion for infectious colitis, with reduced suspicion for IBD.   FINAL MICROSCOPIC DIAGNOSIS:   A. CECAL, POLYPECTOMY:  - Tubular adenoma (2 of 2 fragments)  - No high grade dysplasia or malignancy identified   B. COLON, RIGHT, BIOPSY:  - Active colitis with minimal architectural distortion  - No granulomata, dysplasia or malignancy identified  - See comment   C. COLON, LEFT, BIOPSY:  - Active colitis with minimal architectural distortion  - No granulomata, dysplasia or malignancy identified  - See comment   COMMENT:   B and C. The histologic features are non-specific and can be seen in low  grade ischemia, medication effect, self-limited infectious processes and  inflammatory bowel disease. Clinical-pathologic correlation is  recommended.   He had not further diarrhea during his hospital stay.  He is here today for follow-up and continues to feel well.  Has occasional diarrhea with too much fiber.  Otherwise having normal stools.  No abdominal pain or rectal bleeding.   Recent CBC and CMP look good.  He is still on Florastor probiotics and takes lomotil on occasion if needed.   Past Medical History:  Diagnosis Date   Arthritis    Asthma    Gallstone pancreatitis    Hernia    History of inguinal hernia repair    Low back pain    MS (multiple sclerosis) ([place])    Pancreatitis hosp[italized with gallstone pancreatitis 3/12[   Vitamin D deficiency    Past Surgical History:  Procedure Laterality Date   BIOPSY  09/02/2019   Procedure: BIOPSY;  Surgeon: Ladene Artist, MD;  Location: [facility];  Service: Endoscopy;;   CHOLECYSTECTOMY  04/2010   COLONOSCOPY WITH PROPOFOL N/A 09/02/2019   Procedure: COLONOSCOPY WITH PROPOFOL;  Surgeon: Ladene Artist, MD;  Location: [facility];  Service: Endoscopy;  Laterality: N/A;   CYSTECTOMY     on back and R hand   HERNIA REPAIR     INGUINAL HERNIA REPAIR  03/17/2011   Procedure: HERNIA REPAIR INGUINAL ADULT;  Surgeon: Adin Hector, MD;  Location: [place];  Service: General;  Laterality: Left;  Open left inguinal hernia repair with mesh   KNEE ARTHROSCOPY     right   NOSE SURGERY     POLYPECTOMY  09/02/2019   Procedure: POLYPECTOMY;  Surgeon: Ladene Artist, MD;  Location: [facility];  Service: Endoscopy;;   TONSILLECTOMY     WISDOM TOOTH EXTRACTION      reports that he has never smoked. He has never used smokeless tobacco. He reports that he does not drink alcohol and does not use drugs. family history includes Cancer  in his father; Diabetes in his mother. Allergies  Allergen Reactions   Meperidine Other (See Comments)    Reaction:  Unknown    Prednisone Palpitations      Outpatient Encounter Medications as of 10/03/2019  Medication Sig   amitriptyline (ELAVIL) 25 MG tablet TAKE 2 TABLETS AT BEDTIME AS NEEDED FOR SLEEP (OFFICE VISIT NEEDED BD REFILLS WILL BE GIVEN)   cetirizine (ZYRTEC) 10 MG tablet Take 10 mg by mouth daily as needed for allergies.     cholecalciferol (VITAMIN D) 1000 units tablet Take 2,000 Units by mouth daily.    clotrimazole-betamethasone (LOTRISONE) cream APPLY 1 APPLICATION TOPICALLY TWICE A DAY AS NEEDED FOR IRRITATION   dalfampridine (AMPYRA) 10 MG TB12 Take 1 tablet (10 mg total) by mouth 2 (two) times daily.   diphenoxylate-atropine (LOMOTIL) 2.5-0.025 MG tablet Take 1 tablet by mouth 4 (four) times daily as needed for diarrhea or loose stools.   fluticasone (FLONASE) 50 MCG/ACT nasal spray Place 2 sprays into both nostrils daily as needed for allergies or rhinitis.    gabapentin (NEURONTIN) 100 MG capsule TAKE 2 CAPSULES THREE TIMES A DAY AS NEEDED FOR PAIN (Patient taking differently: Take 200 mg by mouth at bedtime. )   Multiple Vitamin (MULTIVITAMIN WITH MINERALS) TABS tablet Take 1 tablet by mouth daily.   ondansetron (ZOFRAN ODT) 4 MG disintegrating tablet Take 1 tablet (4 mg total) by mouth every 8 (eight) hours as needed for nausea or vomiting.   pantoprazole (PROTONIX) 40 MG tablet TAKE 1 TABLET DAILY   saccharomyces boulardii (FLORASTOR) 250 MG capsule Take 1 capsule (250 mg total) by mouth 2 (two) times daily.   tadalafil (CIALIS) 5 MG tablet Take 1 tablet (5 mg total) by mouth daily.   TraMADol HCl 200 MG CP24 Take 1 capsule by mouth daily.   vancomycin (VANCOCIN HCL) 125 MG capsule Take 1 capsule (125 mg total) by mouth 4 (four) times daily.   baclofen (LIORESAL) 20 MG tablet TAKE 1 TABLET FOUR TIMES A DAY AS NEEDED FOR MUSCLE SPASMS (OFFICE VISIT NEEDED BEFORE REFILLS WILL BE GIVEN) (Patient taking differently: Take 30 mg by mouth 2 (two) times daily. )   No facility-administered encounter medications on file as of 10/03/2019.     REVIEW OF SYSTEMS  : All other systems reviewed and negative except where noted in the History of Present Illness.   PHYSICAL EXAM: There were no vitals taken for this visit. General: Well developed male in no acute distress Head: Normocephalic and  atraumatic Eyes:  Sclerae anicteric, conjunctiva pink. Ears: Normal auditory acuity Lungs: Clear throughout to auscultation; no increased WOB. Heart: Regular rate and rhythm; no M/R/G. Abdomen: Soft, non-distended.  BS present.  Non-tender. Musculoskeletal: Symmetrical with no gross deformities  Skin: No lesions on visible extremities Extremities: No edema  Neurological: Alert oriented x 4, grossly non-focal Psychological:  Alert and cooperative. Normal mood and affect  ASSESSMENT AND PLAN: Jonnatan Murphyis a 64 y.o.malePMH significant for, but not necessarily limited to, multiple sclerosis, gallstone pancreatitis, OA.   *Diarrhea/colitis:  Hospital follow-up.  Much improved.  Only has very occasional diarrhea more so with eating a lot of fiber. --3 months duration --C-diff negative on 07/14/19 and negative GI pathogen panel (including giardia) this admission. C-diff specimen this admit was rejected by lab as stool was too formed.  --Transient improvement with empiric antibiotics x 2 . Also improvement with short course of pancreatic enzymes (no pmh or radiologic evidence of chronic pancreatitis).  --Colonoscopy 7/27 with patchy  moderate inflammation throughout the entire colon.  Placed on vancomycin empirically 7/27.  Biopsies showed active colitis with minimal architectural distortion in the right/left colon.  No granulomata.  Highest suspicion for infectious colitis, with reduced suspicion for IBD. -Continue florastor probiotic for now.  Lomotil prn.  Follow-up as needed for ongoing or worsening symptoms.   CC:  Plotnikov, Evie Lacks, MD

## 2019-10-03 NOTE — Patient Instructions (Addendum)
If you are age 65 or older, your body mass index should be between 23-30. Your Body mass index is 23.65 kg/m. If this is out of the aforementioned range listed, please consider follow up with your Primary Care Provider.  If you are age 27 or younger, your body mass index should be between 19-25. Your Body mass index is 23.65 kg/m. If this is out of the aformentioned range listed, please consider follow up with your Primary Care Provider.   Follow up as needed.  Colonoscopy due July 2024.

## 2019-10-03 NOTE — Progress Notes (Signed)
I agree with the above note and plan

## 2019-10-07 DIAGNOSIS — K219 Gastro-esophageal reflux disease without esophagitis: Secondary | ICD-10-CM | POA: Diagnosis not present

## 2019-10-07 DIAGNOSIS — Z9181 History of falling: Secondary | ICD-10-CM | POA: Diagnosis not present

## 2019-10-07 DIAGNOSIS — G629 Polyneuropathy, unspecified: Secondary | ICD-10-CM | POA: Diagnosis not present

## 2019-10-07 DIAGNOSIS — R32 Unspecified urinary incontinence: Secondary | ICD-10-CM | POA: Diagnosis not present

## 2019-10-07 DIAGNOSIS — G35 Multiple sclerosis: Secondary | ICD-10-CM | POA: Diagnosis not present

## 2019-10-09 ENCOUNTER — Telehealth: Payer: Self-pay | Admitting: Gastroenterology

## 2019-10-09 DIAGNOSIS — R197 Diarrhea, unspecified: Secondary | ICD-10-CM

## 2019-10-09 NOTE — Telephone Encounter (Signed)
Dr Ardis Hughs this pt was seen by Janett Billow on 10/03/19 for infectious colitis.  He was doing better at that point but 2 days ago he has started having constant diarrhea- no blood, abd pain, or other symptoms.  The pt wife says he was given vanc in the hospital and he got better very quickly.  She would like to try and keep him out of the hospital.  He is not able to eat very much and everything he drinks goes straight thru him.  Please advise

## 2019-10-10 ENCOUNTER — Other Ambulatory Visit: Payer: Medicare Other

## 2019-10-10 DIAGNOSIS — R197 Diarrhea, unspecified: Secondary | ICD-10-CM

## 2019-10-10 MED ORDER — VANCOMYCIN HCL 125 MG PO CAPS: 125.0000 mg | ORAL_CAPSULE | Freq: Four times a day (QID) | ORAL | 0 refills | Status: DC

## 2019-10-10 NOTE — Telephone Encounter (Signed)
The patient has been notified of this information and all questions answered.   Stool test entered and prescription sent.  The pt is aware to not begin the prescription until he has completed the stool test.    The pt has been advised of the information and verbalized understanding.

## 2019-10-10 NOTE — Telephone Encounter (Signed)
Yes, I think keeping him out of the hospital would be very ideal.  He needs to come in today for a GI pathogen panel and then after that he can start vancomycin 125 mg p.o. 4 times daily for 2 weeks because it worked so well the last time.

## 2019-10-14 ENCOUNTER — Telehealth (INDEPENDENT_AMBULATORY_CARE_PROVIDER_SITE_OTHER): Payer: Medicare Other | Admitting: Family

## 2019-10-14 ENCOUNTER — Other Ambulatory Visit: Payer: Medicare Other

## 2019-10-14 DIAGNOSIS — G35 Multiple sclerosis: Secondary | ICD-10-CM | POA: Diagnosis not present

## 2019-10-14 DIAGNOSIS — R197 Diarrhea, unspecified: Secondary | ICD-10-CM

## 2019-10-14 MED ORDER — DIPHENOXYLATE-ATROPINE 2.5-0.025 MG PO TABS: 1.0000 | ORAL_TABLET | Freq: Four times a day (QID) | ORAL | 0 refills | Status: DC | PRN

## 2019-10-14 NOTE — Progress Notes (Signed)
[name] is a 64 y.o. male with the following history as recorded in Epic[employer]:  Patient Active Problem List   Diagnosis Date Noted  . Stool incontinence 09/10/2019  . Urgency incontinence 09/10/2019  . Infectious colitis   . Abnormal CT scan, colon   . Benign neoplasm of cecum   . Hematochezia   . Peripheral neuropathy 08/31/2019  . Chronic pancreatitis ([place]) 08/31/2019  . Acute colitis 08/29/2019  . Acute bronchitis 02/27/2018  . Influenza A   . Viral gastroenteritis 03/20/2016  . Nausea & vomiting 03/20/2016  . Asthma 03/20/2016  . Leukocytosis 03/20/2016  . Intractable nausea and vomiting 03/20/2016  . GERD (gastroesophageal reflux disease) 09/25/2014  . Wrist pain, right 09/11/2011  . Left inguinal hernia 02/16/2011  . Paresthesia 01/24/2011  . Well adult exam 07/20/2010  . GALLSTONE PANCREATITIS 04/15/2010  . DIARRHEA, ACUTE 04/15/2010  . FREQUENCY, URINARY 04/15/2010  . Abnormality of gait 04/05/2010  . Headache 04/05/2010  . INSOMNIA, CHRONIC 10/15/2009  . Vitamin D deficiency 06/14/2009  . SEBORRHEIC DERMATITIS 06/14/2009  . LOW BACK PAIN 06/14/2009  . Asthma exacerbation 04/19/2009  . Acute upper respiratory infection 04/13/2009  . Multiple sclerosis ([place]) 05/14/2008  . COUGH 05/14/2008    Current Outpatient Medications  Medication Sig Dispense Refill  . amitriptyline (ELAVIL) 25 MG tablet TAKE 2 TABLETS AT BEDTIME AS NEEDED FOR SLEEP (OFFICE VISIT NEEDED BD REFILLS WILL BE GIVEN) 180 tablet 3  . baclofen (LIORESAL) 20 MG tablet TAKE 1 TABLET FOUR TIMES A DAY AS NEEDED FOR MUSCLE SPASMS (OFFICE VISIT NEEDED BEFORE REFILLS WILL BE GIVEN) (Patient taking differently: Take 30 mg by mouth 2 (two) times daily. ) 360 tablet 0  . cetirizine (ZYRTEC) 10 MG tablet Take 10 mg by mouth daily as needed for allergies.     . cholecalciferol (VITAMIN D) 1000 units tablet Take 2,000 Units by mouth daily.     . clotrimazole-betamethasone (LOTRISONE) cream APPLY 1  APPLICATION TOPICALLY TWICE A DAY AS NEEDED FOR IRRITATION 30 g 2  . dalfampridine (AMPYRA) 10 MG TB12 Take 1 tablet (10 mg total) by mouth 2 (two) times daily. 180 tablet 3  . diphenoxylate-atropine (LOMOTIL) 2.5-0.025 MG tablet Take 1 tablet by mouth 4 (four) times daily as needed for diarrhea or loose stools. 40 tablet 0  . fluticasone (FLONASE) 50 MCG/ACT nasal spray Place 2 sprays into both nostrils daily as needed for allergies or rhinitis.     [name] gabapentin (NEURONTIN) 100 MG capsule TAKE 2 CAPSULES THREE TIMES A DAY AS NEEDED FOR PAIN (Patient taking differently: Take 200 mg by mouth at bedtime. ) 360 capsule 5  . Multiple Vitamin (MULTIVITAMIN WITH MINERALS) TABS tablet Take 1 tablet by mouth daily.    . ondansetron (ZOFRAN ODT) 4 MG disintegrating tablet Take 1 tablet (4 mg total) by mouth every 8 (eight) hours as needed for nausea or vomiting. 20 tablet 0  . pantoprazole (PROTONIX) 40 MG tablet TAKE 1 TABLET DAILY 90 tablet 3  . saccharomyces boulardii (FLORASTOR) 250 MG capsule Take 1 capsule (250 mg total) by mouth 2 (two) times daily. 60 capsule 0  . tadalafil (CIALIS) 5 MG tablet Take 1 tablet (5 mg total) by mouth daily. 30 tablet 11  . TraMADol HCl 200 MG CP24 Take 1 capsule by mouth daily. 90 capsule 1  . vancomycin (VANCOCIN HCL) 125 MG capsule Take 1 capsule (125 mg total) by mouth 4 (four) times daily. 40 capsule 0  . vancomycin (VANCOCIN HCL) 125 MG capsule  Take 1 capsule (125 mg total) by mouth 4 (four) times daily for 14 days. 56 capsule 0   No current facility-administered medications for this visit.    Allergies: Meperidine and Prednisone  Past Medical History:  Diagnosis Date  . Arthritis   . Asthma   . Gallstone pancreatitis   . Hernia   . History of inguinal hernia repair   . Low back pain   . MS (multiple sclerosis) ([place])   . Pancreatitis hosp[italized with gallstone pancreatitis 3/12[  . Vitamin D deficiency     Past Surgical History:  Procedure Laterality  Date  . BIOPSY  09/02/2019   Procedure: BIOPSY;  Surgeon: Ladene Artist, MD;  Location: [facility];  Service: Endoscopy;;  . CHOLECYSTECTOMY  04/2010  . COLONOSCOPY WITH PROPOFOL N/A 09/02/2019   Procedure: COLONOSCOPY WITH PROPOFOL;  Surgeon: Ladene Artist, MD;  Location: [facility];  Service: Endoscopy;  Laterality: N/A;  . CYSTECTOMY     on back and R hand  . HERNIA REPAIR    . INGUINAL HERNIA REPAIR  03/17/2011   Procedure: HERNIA REPAIR INGUINAL ADULT;  Surgeon: Adin Hector, MD;  Location: [facility];  Service: General;  Laterality: Left;  Open left inguinal hernia repair with mesh  . KNEE ARTHROSCOPY     right  . NOSE SURGERY    . POLYPECTOMY  09/02/2019   Procedure: POLYPECTOMY;  Surgeon: Ladene Artist, MD;  [facility];  Service: Endoscopy;;  . TONSILLECTOMY    . WISDOM TOOTH EXTRACTION      Family History  Problem Relation Age of Onset  . Cancer Father        ?  . Diabetes Mother     Social History   Tobacco Use  . Smoking status: Never Smoker  . Smokeless tobacco: Never Used  Substance Use Topics  . Alcohol use: No    Subjective:     I connected with [name] on 10/14/19 at 11:00 AM EDT by a video enabled telemedicine application and verified that I am speaking with the correct person using two identifiers.   I discussed the limitations of evaluation and management by telemedicine and the availability of in person appointments. The patient expressed understanding and agreed to proceed. Provider in office/ patient is at home; provider and patient are only 2 people on video call.   Patient has chronic problems with diarrhea; was hospitalized with colitis at the end of July; has spoken with his GI about re-flare of diarrhea at the end of last week- they have prescribed Vancomycin but have asked that he drop off stool sample before starting the medication. He notes his diarrhea is so bad right now he can't leave his house and  is asking for short term refill on Lomotil;    Objective:  There were no vitals filed for this visit.  General: Well developed, well nourished, in no acute distress  Head: Normocephalic and atraumatic  Lungs: Respirations unlabored;  Neurologic: Alert and oriented; speech intact; face symmetrical;   Assessment:  1. Acute diarrhea     Plan:  Agree to give short term refill on Lomotil so that he can get stool sample to GI; I did ask him to call his GI and discuss the severity of the symptoms- they may want to see him in the office as soon as possible or have him go back to the ER for fluids; he agrees; keep planned follow-up with GI as already scheduled.  No follow-ups on file.  No orders of the defined types were placed in this encounter.   Requested Prescriptions   Signed Prescriptions Disp Refills  . diphenoxylate-atropine (LOMOTIL) 2.5-0.025 MG tablet 40 tablet 0    Sig: Take 1 tablet by mouth 4 (four) times daily as needed for diarrhea or loose stools.

## 2019-10-16 ENCOUNTER — Other Ambulatory Visit: Payer: Self-pay

## 2019-10-16 ENCOUNTER — Other Ambulatory Visit: Payer: Self-pay | Admitting: Internal Medicine

## 2019-10-16 LAB — GI PROFILE, STOOL, PCR

## 2019-10-16 MED ORDER — VANCOMYCIN HCL 125 MG PO CAPS: ORAL_CAPSULE | ORAL | 0 refills | Status: DC

## 2019-10-23 ENCOUNTER — Other Ambulatory Visit: Payer: Self-pay

## 2019-10-23 ENCOUNTER — Ambulatory Visit (INDEPENDENT_AMBULATORY_CARE_PROVIDER_SITE_OTHER): Payer: Medicare Other | Admitting: Internal Medicine

## 2019-10-23 ENCOUNTER — Encounter: Payer: Self-pay | Admitting: Internal Medicine

## 2019-10-23 DIAGNOSIS — M5440 Lumbago with sciatica, unspecified side: Secondary | ICD-10-CM | POA: Diagnosis not present

## 2019-10-23 DIAGNOSIS — G35 Multiple sclerosis: Secondary | ICD-10-CM | POA: Diagnosis not present

## 2019-10-23 DIAGNOSIS — G8929 Other chronic pain: Secondary | ICD-10-CM | POA: Diagnosis not present

## 2019-10-23 DIAGNOSIS — E559 Vitamin D deficiency, unspecified: Secondary | ICD-10-CM | POA: Diagnosis not present

## 2019-10-23 DIAGNOSIS — K529 Noninfective gastroenteritis and colitis, unspecified: Secondary | ICD-10-CM | POA: Diagnosis not present

## 2019-10-23 MED ORDER — VANCOMYCIN HCL 125 MG PO CAPS: 125.0000 mg | ORAL_CAPSULE | Freq: Four times a day (QID) | ORAL | 0 refills | Status: DC

## 2019-10-23 MED ORDER — BACLOFEN 20 MG PO TABS: ORAL_TABLET | ORAL | 1 refills | Status: DC

## 2019-10-23 MED ORDER — PANTOPRAZOLE SODIUM 40 MG PO TBEC: 40.0000 mg | DELAYED_RELEASE_TABLET | Freq: Every day | ORAL | 3 refills | Status: DC

## 2019-10-23 MED ORDER — TRAMADOL HCL (ER BIPHASIC) 200 MG PO CP24: 1.0000 | ORAL_CAPSULE | Freq: Every day | ORAL | 1 refills | Status: DC

## 2019-10-23 NOTE — Progress Notes (Signed)
Subjective:  Patient ID: [name], male    DOB: November 10, 1955  Age: 64 y.o. MRN: [id number]  CC: No chief complaint on file.   HPI [name] presents for C diff relapsed - on the 4th cycle of the abx (PO Vancomycin) due to MS infusions (Ocrevus)   F/o chronic pain, spasms   Outpatient Medications Prior to Visit  Medication Sig Dispense Refill  . amitriptyline (ELAVIL) 25 MG tablet TAKE 2 TABLETS AT BEDTIME AS NEEDED FOR SLEEP (OFFICE VISIT NEEDED BD REFILLS WILL BE GIVEN) 180 tablet 3  . baclofen (LIORESAL) 20 MG tablet TAKE 1 TABLET FOUR TIMES A DAY AS NEEDED FOR MUSCLE SPASMS (OFFICE VISIT NEEDED BEFORE REFILLS WILL BE GIVEN) (Patient taking differently: Take 30 mg by mouth 2 (two) times daily. ) 360 tablet 0  . cetirizine (ZYRTEC) 10 MG tablet Take 10 mg by mouth daily as needed for allergies.     . cholecalciferol (VITAMIN D) 1000 units tablet Take 2,000 Units by mouth daily.     . clotrimazole-betamethasone (LOTRISONE) cream APPLY 1 APPLICATION TOPICALLY TWICE A DAY AS NEEDED FOR IRRITATION 30 g 2  . dalfampridine (AMPYRA) 10 MG TB12 Take 1 tablet (10 mg total) by mouth 2 (two) times daily. 180 tablet 3  . diphenoxylate-atropine (LOMOTIL) 2.5-0.025 MG tablet Take 1 tablet by mouth 4 (four) times daily as needed for diarrhea or loose stools. 40 tablet 0  . fluticasone (FLONASE) 50 MCG/ACT nasal spray Place 2 sprays into both nostrils daily as needed for allergies or rhinitis.     [name] gabapentin (NEURONTIN) 100 MG capsule TAKE 2 CAPSULES THREE TIMES A DAY AS NEEDED FOR PAIN (Patient taking differently: Take 200 mg by mouth at bedtime. ) 360 capsule 5  . Multiple Vitamin (MULTIVITAMIN WITH MINERALS) TABS tablet Take 1 tablet by mouth daily.    . ondansetron (ZOFRAN ODT) 4 MG disintegrating tablet Take 1 tablet (4 mg total) by mouth every 8 (eight) hours as needed for nausea or vomiting. 20 tablet 0  . pantoprazole (PROTONIX) 40 MG tablet TAKE 1 TABLET DAILY 90 tablet 3  .  saccharomyces boulardii (FLORASTOR) 250 MG capsule Take 1 capsule (250 mg total) by mouth 2 (two) times daily. 60 capsule 0  . tadalafil (CIALIS) 5 MG tablet Take 1 tablet (5 mg total) by mouth daily. 30 tablet 11  . TraMADol HCl 200 MG CP24 Take 1 capsule by mouth daily. 90 capsule 1  . vancomycin (VANCOCIN HCL) 125 MG capsule Take 1 capsule (125 mg total) by mouth 4 (four) times daily. 40 capsule 0  . vancomycin (VANCOCIN HCL) 125 MG capsule Take 1 capsule (125 mg total) by mouth 4 (four) times daily for 14 days. 56 capsule 0  . vancomycin (VANCOCIN HCL) 125 MG capsule Take 1 capsule (125 mg total) by mouth 4 (four) times daily for 14 days, THEN 1 capsule (125 mg total) in the morning and at bedtime for 7 days, THEN 1 capsule (125 mg total) daily for 7 days, THEN 1 capsule (125 mg total) every other day. 92 capsule 0   No facility-administered medications prior to visit.    ROS: Review of Systems  Constitutional: Positive for fatigue and unexpected weight change. Negative for appetite change.  HENT: Negative for congestion, nosebleeds, sneezing, sore throat and trouble swallowing.   Eyes: Negative for itching and visual disturbance.  Respiratory: Negative for cough.   Cardiovascular: Negative for chest pain, palpitations and leg swelling.  Gastrointestinal: Negative for abdominal distention, blood  in stool, diarrhea and nausea.  Genitourinary: Negative for frequency and hematuria.  Musculoskeletal: Positive for gait problem. Negative for back pain, joint swelling and neck pain.  Skin: Negative for rash.  Neurological: Positive for weakness and numbness. Negative for dizziness, tremors and speech difficulty.  Psychiatric/Behavioral: Negative for agitation, dysphoric mood, sleep disturbance and suicidal ideas. The patient is not nervous/anxious.     Objective:  BP 140/88 (BP Location: Right Arm, Patient Position: Sitting, Cuff Size: Normal)   Pulse 74   Temp 98.4 F (36.9 C) (Oral)   Ht  5' 7.5" (1.715 m)   Wt 156 lb (70.8 kg)   SpO2 98%   BMI 24.07 kg/m   BP Readings from Last 3 Encounters:  10/23/19 140/88  10/03/19 122/86  09/10/19 128/84    Wt Readings from Last 3 Encounters:  10/23/19 156 lb (70.8 kg)  10/03/19 153 lb 4 oz (69.5 kg)  09/10/19 155 lb (70.3 kg)    Physical Exam Constitutional:      General: He is not in acute distress.    Appearance: He is well-developed.     Comments: NAD  Eyes:     Conjunctiva/sclera: Conjunctivae normal.     Pupils: Pupils are equal, round, and reactive to light.  Neck:     Thyroid: No thyromegaly.     Vascular: No JVD.  Cardiovascular:     Rate and Rhythm: Normal rate and regular rhythm.     Heart sounds: Normal heart sounds. No murmur heard.  No friction rub. No gallop.   Pulmonary:     Effort: Pulmonary effort is normal. No respiratory distress.     Breath sounds: Normal breath sounds. No wheezing or rales.  Chest:     Chest wall: No tenderness.  Abdominal:     General: Bowel sounds are normal. There is no distension.     Palpations: Abdomen is soft. There is no mass.     Tenderness: There is no abdominal tenderness. There is no guarding or rebound.  Musculoskeletal:        General: No tenderness. Normal range of motion.     Cervical back: Normal range of motion.  Lymphadenopathy:     Cervical: No cervical adenopathy.  Skin:    General: Skin is warm and dry.     Findings: No rash.  Neurological:     Mental Status: He is alert and oriented to person, place, and time.     Cranial Nerves: No cranial nerve deficit.     Motor: Weakness present. No abnormal muscle tone.     Coordination: Coordination abnormal.     Gait: Gait abnormal.     Deep Tendon Reflexes: Reflexes are normal and symmetric.  Psychiatric:        Behavior: Behavior normal.        Thought Content: Thought content normal.        Judgment: Judgment normal.   ataxic spastic shuffling  gait  Lab Results  Component Value Date   WBC 4.7  09/22/2019   HGB 13.1 (L) 09/22/2019   HCT 39.7 09/22/2019   PLT 205 09/22/2019   GLUCOSE 99 09/22/2019   CHOL 130 07/11/2019   TRIG 49.0 07/11/2019   HDL 39.40 07/11/2019   LDLDIRECT 118.4 06/09/2009   LDLCALC 81 07/11/2019   ALT 29 09/22/2019   AST 22 09/22/2019   NA 140 09/22/2019   K 4.4 09/22/2019   CL 103 09/22/2019   CREATININE 0.75 09/22/2019   BUN 12 09/22/2019  CO2 32 09/22/2019   TSH 3.05 07/11/2019   PSA 3.78 07/11/2019   INR 1.0 08/24/2008   HGBA1C 5.8 07/11/2019    CT ABDOMEN PELVIS W CONTRAST  Result Date: 08/29/2019 CLINICAL DATA:  Diarrhea for 3 months EXAM: CT ABDOMEN AND PELVIS WITH CONTRAST TECHNIQUE: Multidetector CT imaging of the abdomen and pelvis was performed using the standard protocol following bolus administration of intravenous contrast. CONTRAST:  149mL OMNIPAQUE IOHEXOL 300 MG/ML  SOLN COMPARISON:  01/31/2018 FINDINGS: Lower chest: No acute pleural or parenchymal lung disease. Hepatobiliary: Gallbladder is surgically absent. Left lobe of the liver is atrophic, a chronic finding. Remainder of the liver is unremarkable. Pancreas: Unremarkable. No pancreatic ductal dilatation or surrounding inflammatory changes. Spleen: Normal in size without focal abnormality. Adrenals/Urinary Tract: Stable right renal cysts. Kidneys otherwise unremarkable. Bladder is moderately distended with no focal abnormality. The adrenals are normal. Stomach/Bowel: No bowel obstruction or ileus. There is mild wall thickening of the descending colon from the splenic flexure through the rectosigmoid junction, with mild pericolonic fat stranding. Findings are consistent with inflammatory or infectious colitis. Vascular/Lymphatic: No significant vascular findings are present. No enlarged abdominal or pelvic lymph nodes. Reproductive: Mild enlargement of the prostate, stable. Other: No abdominal wall hernia or abnormality. No abdominopelvic ascites. Musculoskeletal: No acute or destructive  bony lesions. Reconstructed images demonstrate no additional findings. IMPRESSION: 1. Inflammatory or infectious colitis involving the descending colon. 2. Stable mild enlargement of the prostate. Electronically Signed   By: Randa Ngo M.D.   On: 08/29/2019 18:58    Assessment & Plan:    Walker Kehr, MD

## 2019-10-23 NOTE — Assessment & Plan Note (Signed)
On Vit D 

## 2019-10-23 NOTE — Assessment & Plan Note (Signed)
C diff relapsed - on the 4th cycle of the abx (PO Vancomycin) due to MS infusions (Ocrevus) - d/c'd

## 2019-10-23 NOTE — Assessment & Plan Note (Signed)
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

## 2019-10-23 NOTE — Assessment & Plan Note (Signed)
On Tramadol Potential benefits of a long term Tramadol/opioids use as well as potential risks (i.e. addiction risk, apnea etc) and complications (i.e. Somnolence, constipation and others) were explained to the patient and were aknowledged. On Baclofen

## 2019-11-05 DIAGNOSIS — H0288A Meibomian gland dysfunction right eye, upper and lower eyelids: Secondary | ICD-10-CM | POA: Diagnosis not present

## 2019-11-05 DIAGNOSIS — G35 Multiple sclerosis: Secondary | ICD-10-CM | POA: Diagnosis not present

## 2019-11-05 DIAGNOSIS — H0288B Meibomian gland dysfunction left eye, upper and lower eyelids: Secondary | ICD-10-CM | POA: Diagnosis not present

## 2019-11-05 DIAGNOSIS — H2513 Age-related nuclear cataract, bilateral: Secondary | ICD-10-CM | POA: Diagnosis not present

## 2019-11-05 DIAGNOSIS — H4720 Unspecified optic atrophy: Secondary | ICD-10-CM | POA: Diagnosis not present

## 2019-11-05 DIAGNOSIS — H0102B Squamous blepharitis left eye, upper and lower eyelids: Secondary | ICD-10-CM | POA: Diagnosis not present

## 2019-11-05 DIAGNOSIS — D899 Disorder involving the immune mechanism, unspecified: Secondary | ICD-10-CM | POA: Diagnosis not present

## 2019-11-05 DIAGNOSIS — H0102A Squamous blepharitis right eye, upper and lower eyelids: Secondary | ICD-10-CM | POA: Diagnosis not present

## 2019-11-12 ENCOUNTER — Encounter: Payer: Self-pay | Admitting: Gastroenterology

## 2019-11-12 ENCOUNTER — Ambulatory Visit (INDEPENDENT_AMBULATORY_CARE_PROVIDER_SITE_OTHER): Payer: Medicare Other | Admitting: Gastroenterology

## 2019-11-12 VITALS — BP 118/82 | HR 88 | Ht 67.0 in | Wt 155.4 lb

## 2019-11-12 DIAGNOSIS — Z8619 Personal history of other infectious and parasitic diseases: Secondary | ICD-10-CM | POA: Insufficient documentation

## 2019-11-12 DIAGNOSIS — R197 Diarrhea, unspecified: Secondary | ICD-10-CM | POA: Diagnosis not present

## 2019-11-12 DIAGNOSIS — A0472 Enterocolitis due to Clostridium difficile, not specified as recurrent: Secondary | ICD-10-CM | POA: Diagnosis not present

## 2019-11-12 NOTE — Patient Instructions (Signed)
If you are age 65 or older, your body mass index should be between 23-30. Your Body mass index is 24.34 kg/m. If this is out of the aforementioned range listed, please consider follow up with your Primary Care Provider.  If you are age 23 or younger, your body mass index should be between 19-25. Your Body mass index is 24.34 kg/m. If this is out of the aformentioned range listed, please consider follow up with your Primary Care Provider.   INCREASE Florastar to twice daily.  Follow up with the office on how you are doing by phone or MyChart for the time being.

## 2019-11-12 NOTE — Progress Notes (Signed)
11/12/2019 [name] [id number] 1955/05/07   HISTORY OF PRESENT ILLNESS:  [name] is here today for follow-up of his diarrhea.  He was here on 8/27 for hospital followup and was doing well (see that note for details).  The next day he said that the diarrhea started again.  Cdiff was positive.  He was placed on vancomycin taper.  Has been on vancomycin 125 mg QID for 4 weeks and just decreased to BID today.  Says that within a couple of days of being on the antibiotics he stools started to firm up.  Had 3 semi-formed BMs yesterday.  None so far today.  No abdominal pain or rectal bleeding.  Colonoscopy 7/27 with patchy moderate inflammation throughout the entire colon.  Placed on vancomycin empirically 7/27.  Biopsies showed active colitis with minimal architectural distortion in the right/left colon. No granulomata. Highest suspicion for infectious colitis, with reduced suspicion for IBD.   FINAL MICROSCOPIC DIAGNOSIS:   A. CECAL, POLYPECTOMY:  - Tubular adenoma (2 of 2 fragments)  - No high grade dysplasia or malignancy identified   B. COLON, RIGHT, BIOPSY:  - Active colitis with minimal architectural distortion  - No granulomata, dysplasia or malignancy identified  - See comment   C. COLON, LEFT, BIOPSY:  - Active colitis with minimal architectural distortion  - No granulomata, dysplasia or malignancy identified  - See comment   COMMENT:   B and C. The histologic features are non-specific and can be seen in low  grade ischemia, medication effect, self-limited infectious processes and  inflammatory bowel disease. Clinical-pathologic correlation is  recommended.    Past Medical History:  Diagnosis Date  . Arthritis   . Asthma   . C. difficile diarrhea   . Gallstone pancreatitis   . Hernia   . History of inguinal hernia repair   . Low back pain   . MS (multiple sclerosis) ([place])   . Pancreatitis hosp[italized with gallstone pancreatitis 3/12[  . Vitamin  D deficiency    Past Surgical History:  Procedure Laterality Date  . BIOPSY  09/02/2019   Procedure: BIOPSY;  Surgeon: Ladene Artist, MD;  Location: [facility];  Service: Endoscopy;;  . CHOLECYSTECTOMY  04/2010  . COLONOSCOPY WITH PROPOFOL N/A 09/02/2019   Procedure: COLONOSCOPY WITH PROPOFOL;  Surgeon: Ladene Artist, MD;  Location[facility];  Service: Endoscopy;  Laterality: N/A;  . CYSTECTOMY     on back and R hand  . CYSTECTOMY     left arm  . HERNIA REPAIR     x 2  . INGUINAL HERNIA REPAIR  03/17/2011   Procedure: HERNIA REPAIR INGUINAL ADULT;  Surgeon: Adin Hector, MD;  Location: [facility];  Service: General;  Laterality: Left;  Open left inguinal hernia repair with mesh  . KNEE ARTHROSCOPY     right  . NOSE SURGERY    . POLYPECTOMY  09/02/2019   Procedure: POLYPECTOMY;  Surgeon: Ladene Artist, MD;  Location: [facility];  Service: Endoscopy;;  . TONSILLECTOMY    . WISDOM TOOTH EXTRACTION      reports that he has never smoked. He has never used smokeless tobacco. He reports that he does not drink alcohol and does not use drugs. family history includes Cancer in his father; Diabetes in his mother. Allergies  Allergen Reactions  . Meperidine Other (See Comments)    Reaction:  Unknown   . Ocrevus [Ocrelizumab]     C diff relapsed x4  . Prednisone  Palpitations    Only IV      Outpatient Encounter Medications as of 11/12/2019  Medication Sig  . amitriptyline (ELAVIL) 25 MG tablet TAKE 2 TABLETS AT BEDTIME AS NEEDED FOR SLEEP (OFFICE VISIT NEEDED BD REFILLS WILL BE GIVEN)  . baclofen (LIORESAL) 20 MG tablet TAKE 1 TABLET FOUR TIMES A DAY AS NEEDED FOR MUSCLE SPASMS (OFFICE VISIT NEEDED BEFORE REFILLS WILL BE GIVEN)  . cetirizine (ZYRTEC) 10 MG tablet Take 10 mg by mouth daily as needed for allergies.   . cholecalciferol (VITAMIN D) 1000 units tablet Take 1,000 Units by mouth daily.   . clotrimazole-betamethasone (LOTRISONE) cream APPLY 1  APPLICATION TOPICALLY TWICE A DAY AS NEEDED FOR IRRITATION  . diphenoxylate-atropine (LOMOTIL) 2.5-0.025 MG tablet Take 1 tablet by mouth 4 (four) times daily as needed for diarrhea or loose stools.  . fluticasone (FLONASE) 50 MCG/ACT nasal spray Place 2 sprays into both nostrils daily as needed for allergies or rhinitis.   [name] gabapentin (NEURONTIN) 100 MG capsule TAKE 2 CAPSULES THREE TIMES A DAY AS NEEDED FOR PAIN (Patient taking differently: Take 200 mg by mouth at bedtime. )  . Multiple Vitamin (MULTIVITAMIN WITH MINERALS) TABS tablet Take 1 tablet by mouth daily.  . ondansetron (ZOFRAN ODT) 4 MG disintegrating tablet Take 1 tablet (4 mg total) by mouth every 8 (eight) hours as needed for nausea or vomiting.  . pantoprazole (PROTONIX) 40 MG tablet Take 1 tablet (40 mg total) by mouth daily.  [name] saccharomyces boulardii (FLORASTOR) 250 MG capsule Take 1 capsule (250 mg total) by mouth 2 (two) times daily. (Patient taking differently: Take 250 mg by mouth daily. )  . tadalafil (CIALIS) 5 MG tablet Take 1 tablet (5 mg total) by mouth daily.  . TraMADol HCl 200 MG CP24 Take 1 capsule by mouth daily.  . vancomycin (VANCOCIN HCL) 125 MG capsule Take 1 capsule (125 mg total) by mouth 4 (four) times daily for 14 days, THEN 1 capsule (125 mg total) in the morning and at bedtime for 7 days, THEN 1 capsule (125 mg total) daily for 7 days, THEN 1 capsule (125 mg total) every other day. (Patient taking differently: Take 1 capsule (125 mg total) by mouth 4 (four) times daily for 14 days, THEN 1 capsule (125 mg total) in the morning and at bedtime for 7 days, THEN 1 capsule (125 mg total) daily for 7 days, THEN 1 capsule (125 mg total) every other day.   Currently taking twice daily dosing as of 11/12/19 (started today))  . dalfampridine (AMPYRA) 10 MG TB12 Take 1 tablet (10 mg total) by mouth 2 (two) times daily. (Patient not taking: Reported on 11/12/2019)   No facility-administered encounter medications on file as  of 11/12/2019.     REVIEW OF SYSTEMS  : All other systems reviewed and negative except where noted in the History of Present Illness.   PHYSICAL EXAM: Ht 5\' 7"  (1.702 m)   Wt 155 lb 6.4 oz (70.5 kg)   BMI 24.34 kg/m  General: Well developed white male in no acute distress Head: Normocephalic and atraumatic Eyes:  Sclerae anicteric, conjunctiva pink. Ears: Normal auditory acuity Lungs: Clear throughout to auscultation; no W/R/R. Heart: Regular rate and rhythm; no M/R/G. Abdomen: Soft, non-distended.  BS present.  Non-tender. Musculoskeletal: Symmetrical with no gross deformities  Skin: No lesions on visible extremities Extremities: No edema  Neurological: Alert oriented x 4, grossly non-focal Psychological:  Alert and cooperative. Normal mood and affect  ASSESSMENT AND PLAN:  Rylee Murphyis a 64 y.o.malePMH significant for, but not necessarily limited to, multiple sclerosis, gallstone pancreatitis, OA.  *Diarrhea/Cdiff colitis: --C-diff negative on 07/14/19 and negative GI pathogen panel (including giardia) recent admission. C-diff specimen this admit was rejected by lab as stool was too formed.  --Transient improvement with empiric antibiotics x 2 . Also improvement with short course of pancreatic enzymes (no pmh or radiologic evidence of chronic pancreatitis). --Colonoscopy 7/27 with patchy moderate inflammation throughout the entire colon. Placed on vancomycin empirically 7/27.  Biopsies showed active colitis with minimal architectural distortion in the right/left colon. No granulomata. Highest suspicion for infectious colitis, with reduced suspicion for IBD. --Was placed on vancomycin and he responded to that so he completed a course of that.  On follow-up 8/27 was doing much better. --Called back the next week with worsening symptoms again and Cdiff was positive.  Placed on another course of vancomycin (taper) and has been on QID dosing for 4 weeks, just decreased to BID  for now.  Improved for now.  **Will complete the tapering course of vancomycin. **Increase Florastor to BID for now. **Contact us back with follow-up after completing abx.  CC:  Plotnikov, Evie Lacks, MD

## 2019-11-12 NOTE — Progress Notes (Signed)
I agree with the above note, plan 

## 2019-12-01 ENCOUNTER — Telehealth: Payer: Self-pay | Admitting: Gastroenterology

## 2019-12-01 NOTE — Telephone Encounter (Signed)
Pt has recent hx of C Diff and was given Vanc taper. He says when he was on the vanc daily he had formed stools. When he started vanc 125 mg every other day the diarrhea returned.  He says he was up all last night with watery diarrhea.  He is going about 10 times daily during the day.  Please advise

## 2019-12-01 NOTE — Telephone Encounter (Signed)
Left message on machine to call back  

## 2019-12-02 NOTE — Telephone Encounter (Signed)
Dr. Ardis Hughs, I need your input on this.  He is still on vanco taper.  Should I switch him to Dificid?  Thank you,  Jess

## 2019-12-03 MED ORDER — DIFICID 200 MG PO TABS: 200.0000 mg | ORAL_TABLET | Freq: Two times a day (BID) | ORAL | 0 refills | Status: AC

## 2019-12-03 NOTE — Telephone Encounter (Signed)
The pt has been advised and dificid has been sent to the pharmacy.  He will call with an update in a week

## 2019-12-03 NOTE — Telephone Encounter (Signed)
YEs, I think switching is a good idea. Full 2 weeks of dificid course.

## 2019-12-03 NOTE — Telephone Encounter (Signed)
Please let the patient know that I discussed this with Dr. Ardis Hughs and we are going to change his antibiotic to Dificid 200 mg twice daily for 14 days.  Please send the prescription to his pharmacy.

## 2019-12-08 ENCOUNTER — Encounter: Payer: Self-pay | Admitting: Internal Medicine

## 2019-12-08 ENCOUNTER — Ambulatory Visit (INDEPENDENT_AMBULATORY_CARE_PROVIDER_SITE_OTHER): Payer: Medicare Other | Admitting: Internal Medicine

## 2019-12-08 ENCOUNTER — Other Ambulatory Visit: Payer: Self-pay

## 2019-12-08 DIAGNOSIS — J4520 Mild intermittent asthma, uncomplicated: Secondary | ICD-10-CM

## 2019-12-08 DIAGNOSIS — A0472 Enterocolitis due to Clostridium difficile, not specified as recurrent: Secondary | ICD-10-CM

## 2019-12-08 DIAGNOSIS — M5440 Lumbago with sciatica, unspecified side: Secondary | ICD-10-CM

## 2019-12-08 DIAGNOSIS — Z23 Encounter for immunization: Secondary | ICD-10-CM

## 2019-12-08 DIAGNOSIS — G35 Multiple sclerosis: Secondary | ICD-10-CM

## 2019-12-08 DIAGNOSIS — A09 Infectious gastroenteritis and colitis, unspecified: Secondary | ICD-10-CM | POA: Diagnosis not present

## 2019-12-08 DIAGNOSIS — E559 Vitamin D deficiency, unspecified: Secondary | ICD-10-CM

## 2019-12-08 DIAGNOSIS — G8929 Other chronic pain: Secondary | ICD-10-CM | POA: Diagnosis not present

## 2019-12-08 NOTE — Assessment & Plan Note (Signed)
Vit D 

## 2019-12-08 NOTE — Progress Notes (Signed)
Subjective:  Patient ID: [name], male    DOB: [birth year]  Age: 64 y.o. MRN: [id number]  CC: No chief complaint on file.   HPI [name] presents for MS, LBP, h/o C diff - on Vncomycin since May  Outpatient Medications Prior to Visit  Medication Sig Dispense Refill  . saccharomyces boulardii (FLORASTOR) 250 MG capsule Take by mouth.    . tadalafil (CIALIS) 5 MG tablet Take by mouth.    . clotrimazole-betamethasone (LOTRISONE) cream as needed.    . diphenoxylate-atropine (LOMOTIL) 2.5-0.025 MG tablet Take by mouth.    [name] amitriptyline (ELAVIL) 25 MG tablet TAKE 2 TABLETS AT BEDTIME AS NEEDED FOR SLEEP (OFFICE VISIT NEEDED BD REFILLS WILL BE GIVEN) 180 tablet 3  . baclofen (LIORESAL) 20 MG tablet TAKE 1 TABLET FOUR TIMES A DAY AS NEEDED FOR MUSCLE SPASMS (OFFICE VISIT NEEDED BEFORE REFILLS WILL BE GIVEN) 360 tablet 1  . cetirizine (ZYRTEC) 10 MG tablet Take 10 mg by mouth daily as needed for allergies.     . cholecalciferol (VITAMIN D) 1000 units tablet Take 1,000 Units by mouth daily.     . Cholecalciferol 50 MCG (2000 UT) CAPS Take 1 capsule by mouth daily.    . clotrimazole-betamethasone (LOTRISONE) cream APPLY 1 APPLICATION TOPICALLY TWICE A DAY AS NEEDED FOR IRRITATION 30 g 2  . dalfampridine (AMPYRA) 10 MG TB12 Take 1 tablet (10 mg total) by mouth 2 (two) times daily. (Patient not taking: Reported on 11/12/2019) 180 tablet 3  . diphenoxylate-atropine (LOMOTIL) 2.5-0.025 MG tablet Take 1 tablet by mouth 4 (four) times daily as needed for diarrhea or loose stools. 40 tablet 0  . fidaxomicin (DIFICID) 200 MG TABS tablet Take 1 tablet (200 mg total) by mouth 2 (two) times daily for 14 days. 28 tablet 0  . fluticasone (FLONASE) 50 MCG/ACT nasal spray Place 2 sprays into both nostrils daily as needed for allergies or rhinitis.     [name] gabapentin (NEURONTIN) 100 MG capsule TAKE 2 CAPSULES THREE TIMES A DAY AS NEEDED FOR PAIN (Patient taking differently: Take 200 mg by mouth at bedtime.  ) 360 capsule 5  . Multiple Vitamin (MULTIVITAMIN) capsule Take 1 tablet by mouth daily.    . ondansetron (ZOFRAN ODT) 4 MG disintegrating tablet Take 1 tablet (4 mg total) by mouth every 8 (eight) hours as needed for nausea or vomiting. 20 tablet 0  . pantoprazole (PROTONIX) 40 MG tablet Take 1 tablet (40 mg total) by mouth daily. 90 tablet 3  . saccharomyces boulardii (FLORASTOR) 250 MG capsule Take 1 capsule (250 mg total) by mouth 2 (two) times daily. (Patient taking differently: Take 250 mg by mouth daily. ) 60 capsule 0  . tadalafil (CIALIS) 5 MG tablet Take 1 tablet (5 mg total) by mouth daily. 30 tablet 11  . TraMADol HCl 200 MG CP24 Take 1 capsule by mouth daily. 90 capsule 1  . Multiple Vitamin (MULTIVITAMIN WITH MINERALS) TABS tablet Take 1 tablet by mouth daily.     No facility-administered medications prior to visit.    ROS: Review of Systems  Constitutional: Negative for appetite change, fatigue and unexpected weight change.  HENT: Negative for congestion, nosebleeds, sneezing, sore throat and trouble swallowing.   Eyes: Negative for itching and visual disturbance.  Respiratory: Negative for cough.   Cardiovascular: Negative for chest pain, palpitations and leg swelling.  Gastrointestinal: Negative for abdominal distention, blood in stool, diarrhea and nausea.  Genitourinary: Negative for frequency and hematuria.  Musculoskeletal: Positive for  back pain and gait problem. Negative for joint swelling and neck pain.  Skin: Negative for rash.  Neurological: Negative for dizziness, tremors, speech difficulty and weakness.  Psychiatric/Behavioral: Negative for agitation, dysphoric mood, sleep disturbance and suicidal ideas. The patient is not nervous/anxious.     Objective:  There were no vitals taken for this visit.  BP Readings from Last 3 Encounters:  11/12/19 118/82  10/23/19 140/88  10/03/19 122/86    Wt Readings from Last 3 Encounters:  11/12/19 155 lb 6.4 oz (70.5  kg)  10/23/19 156 lb (70.8 kg)  10/03/19 153 lb 4 oz (69.5 kg)    Physical Exam Constitutional:      General: He is not in acute distress.    Appearance: He is well-developed.     Comments: NAD  Eyes:     Conjunctiva/sclera: Conjunctivae normal.     Pupils: Pupils are equal, round, and reactive to light.  Neck:     Thyroid: No thyromegaly.     Vascular: No JVD.  Cardiovascular:     Rate and Rhythm: Normal rate and regular rhythm.     Heart sounds: Normal heart sounds. No murmur heard.  No friction rub. No gallop.   Pulmonary:     Effort: Pulmonary effort is normal. No respiratory distress.     Breath sounds: Normal breath sounds. No wheezing or rales.  Chest:     Chest wall: No tenderness.  Abdominal:     General: Bowel sounds are normal. There is no distension.     Palpations: Abdomen is soft. There is no mass.     Tenderness: There is no abdominal tenderness. There is no guarding or rebound.  Musculoskeletal:        General: No tenderness. Normal range of motion.     Cervical back: Normal range of motion.  Lymphadenopathy:     Cervical: No cervical adenopathy.  Skin:    General: Skin is warm and dry.     Findings: No rash.  Neurological:     Mental Status: He is alert and oriented to person, place, and time.     Cranial Nerves: No cranial nerve deficit.     Motor: Weakness present. No abnormal muscle tone.     Coordination: Coordination abnormal.     Gait: Gait abnormal.     Deep Tendon Reflexes: Reflexes are normal and symmetric.  Psychiatric:        Behavior: Behavior normal.        Thought Content: Thought content normal.        Judgment: Judgment normal.   trekking poles  Lab Results  Component Value Date   WBC 4.7 09/22/2019   HGB 13.1 (L) 09/22/2019   HCT 39.7 09/22/2019   PLT 205 09/22/2019   GLUCOSE 99 09/22/2019   CHOL 130 07/11/2019   TRIG 49.0 07/11/2019   HDL 39.40 07/11/2019   LDLDIRECT 118.4 06/09/2009   LDLCALC 81 07/11/2019   ALT 29  09/22/2019   AST 22 09/22/2019   NA 140 09/22/2019   K 4.4 09/22/2019   CL 103 09/22/2019   CREATININE 0.75 09/22/2019   BUN 12 09/22/2019   CO2 32 09/22/2019   TSH 3.05 07/11/2019   PSA 3.78 07/11/2019   INR 1.0 08/24/2008   HGBA1C 5.8 07/11/2019    CT ABDOMEN PELVIS W CONTRAST  Result Date: 08/29/2019 CLINICAL DATA:  Diarrhea for 3 months EXAM: CT ABDOMEN AND PELVIS WITH CONTRAST TECHNIQUE: Multidetector CT imaging of the abdomen and pelvis was  performed using the standard protocol following bolus administration of intravenous contrast. CONTRAST:  153mL OMNIPAQUE IOHEXOL 300 MG/ML  SOLN COMPARISON:  01/31/2018 FINDINGS: Lower chest: No acute pleural or parenchymal lung disease. Hepatobiliary: Gallbladder is surgically absent. Left lobe of the liver is atrophic, a chronic finding. Remainder of the liver is unremarkable. Pancreas: Unremarkable. No pancreatic ductal dilatation or surrounding inflammatory changes. Spleen: Normal in size without focal abnormality. Adrenals/Urinary Tract: Stable right renal cysts. Kidneys otherwise unremarkable. Bladder is moderately distended with no focal abnormality. The adrenals are normal. Stomach/Bowel: No bowel obstruction or ileus. There is mild wall thickening of the descending colon from the splenic flexure through the rectosigmoid junction, with mild pericolonic fat stranding. Findings are consistent with inflammatory or infectious colitis. Vascular/Lymphatic: No significant vascular findings are present. No enlarged abdominal or pelvic lymph nodes. Reproductive: Mild enlargement of the prostate, stable. Other: No abdominal wall hernia or abnormality. No abdominopelvic ascites. Musculoskeletal: No acute or destructive bony lesions. Reconstructed images demonstrate no additional findings. IMPRESSION: 1. Inflammatory or infectious colitis involving the descending colon. 2. Stable mild enlargement of the prostate. Electronically Signed   By: Randa Ngo M.D.    On: 08/29/2019 18:58    Assessment & Plan:   Diagnoses and all orders for this visit:  Mild intermittent asthma without complication  Clostridium difficile colitis  Multiple sclerosis ([place])  Vitamin D deficiency  Chronic midline low back pain with sciatica, sciatica laterality unspecified     No orders of the defined types were placed in this encounter.    Follow-up: No follow-ups on file.  Walker Kehr, MD

## 2019-12-08 NOTE — Assessment & Plan Note (Signed)
Tramadol prn 

## 2019-12-08 NOTE — Assessment & Plan Note (Signed)
Tramadol prn Potential benefits of a long term Tramadol/opioids use as well as potential risks (i.e. addiction risk, apnea etc) and complications (i.e. Somnolence, constipation and others) were explained to the patient and were aknowledged. On Baclofen Off Ocrevus

## 2019-12-08 NOTE — Assessment & Plan Note (Addendum)
The pt has been on Vncomycin since May . No relapse

## 2019-12-08 NOTE — Assessment & Plan Note (Signed)
Treat allergies prn

## 2019-12-08 NOTE — Addendum Note (Signed)
Addended by: Earnstine Regal on: 12/08/2019 02:12 PM   Modules accepted: Orders

## 2019-12-08 NOTE — Assessment & Plan Note (Signed)
The pt has been on Vncomycin since May . No relapse

## 2019-12-19 ENCOUNTER — Other Ambulatory Visit: Payer: Self-pay | Admitting: *Deleted

## 2019-12-19 MED ORDER — TADALAFIL 5 MG PO TABS: 5.0000 mg | ORAL_TABLET | Freq: Every day | ORAL | 3 refills | Status: DC

## 2020-02-02 NOTE — Telephone Encounter (Signed)
[facility]   Caller reports that he has had ongoing bacterial infection since April, hx of c-diff, multiple rounds of iv antibiotics. Hx of MS and on receiving immunosuppression infusion. Diarrhea and loose stools since last Thursday evening, fever denied. Loose stools reported, no blood. Last antibiotics for diarrheal infection one month ago.   [facility] advised: See PCP within 24 Hours   Patient understood and complied.   Please advise.

## 2020-02-03 ENCOUNTER — Other Ambulatory Visit: Payer: Self-pay | Admitting: Internal Medicine

## 2020-02-03 DIAGNOSIS — A0472 Enterocolitis due to Clostridium difficile, not specified as recurrent: Secondary | ICD-10-CM

## 2020-02-03 MED ORDER — VANCOMYCIN HCL 125 MG PO CAPS: 125.0000 mg | ORAL_CAPSULE | Freq: Four times a day (QID) | ORAL | 0 refills | Status: AC

## 2020-02-05 ENCOUNTER — Telehealth (INDEPENDENT_AMBULATORY_CARE_PROVIDER_SITE_OTHER): Payer: Medicare Other | Admitting: Internal Medicine

## 2020-02-05 ENCOUNTER — Other Ambulatory Visit: Payer: Self-pay

## 2020-02-05 ENCOUNTER — Encounter: Payer: Self-pay | Admitting: Internal Medicine

## 2020-02-05 DIAGNOSIS — A09 Infectious gastroenteritis and colitis, unspecified: Secondary | ICD-10-CM

## 2020-02-05 DIAGNOSIS — K5901 Slow transit constipation: Secondary | ICD-10-CM | POA: Diagnosis not present

## 2020-02-05 DIAGNOSIS — G35 Multiple sclerosis: Secondary | ICD-10-CM

## 2020-02-05 DIAGNOSIS — G6189 Other inflammatory polyneuropathies: Secondary | ICD-10-CM | POA: Diagnosis not present

## 2020-02-05 MED ORDER — ALIGN 4 MG PO CAPS: 1.0000 | ORAL_CAPSULE | Freq: Every day | ORAL | 11 refills | Status: DC

## 2020-02-05 NOTE — Assessment & Plan Note (Signed)
Stable. Tramadol for pain as needed 

## 2020-02-05 NOTE — Assessment & Plan Note (Signed)
Stable. Tramadol for pain as needed

## 2020-02-05 NOTE — Assessment & Plan Note (Addendum)
One episode of acute diarrhea that started last Thursday. We sent a prescription for vancomycin to the pharmacy. However, the diarrhea stopped after a day and a half and then is never took vancomycin. Carman is complaining of constipation when he will have a bowel movement for 2 3 days he takes Colace and sometimes she gets loose stools or soft stools several times after that.  Bring stool for C. difficile test if relapsed

## 2020-02-05 NOTE — Assessment & Plan Note (Addendum)
Chronic.  Colace is too strong at times. He can stop it. Then he is can try Metamucil, Citrucel or MiraLAX and titrated from a low dose up for regular stools. He can also try prunes or prune juice.

## 2020-02-05 NOTE — Progress Notes (Signed)
Virtual Visit via Video Note  I connected with [name] on 02/05/20 at  2:40 PM EST by a video enabled telemedicine application and verified that I am speaking with the correct person using two identifiers.   I discussed the limitations of evaluation and management by telemedicine and the availability of in person appointments. The patient expressed understanding and agreed to proceed.  I was located at our [facility] office. The patient was at home. There was no one else present in the visit.   History of Present Illness: We need to follow-up on episode of acute diarrhea that started last Thursday. We sent a prescription for vancomycin to the pharmacy. However, the diarrhea stopped after a day and a half and then is never took vancomycin. [name] is complaining of constipation when he will have a bowel movement for 2 3 days he takes Colace and sometimes she gets loose stools or soft stools several times after that. She is complaining of chronic constipation otherwise.  There has been no runny nose, cough, chest pain, shortness of breath, abdominal pain, skin rashes.    Observations/Objective: The patient appears to be in no acute distress, looks well.  Assessment and Plan:  See my Assessment and Plan.  Follow Up Instructions:    I discussed the assessment and treatment plan with the patient. The patient was provided an opportunity to ask questions and all were answered. The patient agreed with the plan and demonstrated an understanding of the instructions.   The patient was advised to call back or seek an in-person evaluation if the symptoms worsen or if the condition fails to improve as anticipated.  I provided face-to-face time during this encounter. We were at different locations.   Sonda Primes, MD

## 2020-03-08 ENCOUNTER — Other Ambulatory Visit: Payer: Self-pay

## 2020-03-08 DIAGNOSIS — L814 Other melanin hyperpigmentation: Secondary | ICD-10-CM | POA: Diagnosis not present

## 2020-03-08 DIAGNOSIS — L820 Inflamed seborrheic keratosis: Secondary | ICD-10-CM | POA: Diagnosis not present

## 2020-03-08 DIAGNOSIS — L570 Actinic keratosis: Secondary | ICD-10-CM | POA: Diagnosis not present

## 2020-03-08 DIAGNOSIS — D485 Neoplasm of uncertain behavior of skin: Secondary | ICD-10-CM | POA: Diagnosis not present

## 2020-03-08 DIAGNOSIS — D1801 Hemangioma of skin and subcutaneous tissue: Secondary | ICD-10-CM | POA: Diagnosis not present

## 2020-03-08 DIAGNOSIS — Z85828 Personal history of other malignant neoplasm of skin: Secondary | ICD-10-CM | POA: Diagnosis not present

## 2020-03-08 DIAGNOSIS — L821 Other seborrheic keratosis: Secondary | ICD-10-CM | POA: Diagnosis not present

## 2020-03-08 DIAGNOSIS — D225 Melanocytic nevi of trunk: Secondary | ICD-10-CM | POA: Diagnosis not present

## 2020-03-09 ENCOUNTER — Ambulatory Visit (INDEPENDENT_AMBULATORY_CARE_PROVIDER_SITE_OTHER): Payer: Medicare Other | Admitting: Internal Medicine

## 2020-03-09 ENCOUNTER — Encounter: Payer: Self-pay | Admitting: Internal Medicine

## 2020-03-09 ENCOUNTER — Other Ambulatory Visit: Payer: Self-pay | Admitting: Internal Medicine

## 2020-03-09 VITALS — BP 124/80 | HR 77 | Temp 98.4°F | Ht 67.0 in | Wt 154.0 lb

## 2020-03-09 DIAGNOSIS — K219 Gastro-esophageal reflux disease without esophagitis: Secondary | ICD-10-CM

## 2020-03-09 DIAGNOSIS — A09 Infectious gastroenteritis and colitis, unspecified: Secondary | ICD-10-CM

## 2020-03-09 DIAGNOSIS — G8929 Other chronic pain: Secondary | ICD-10-CM | POA: Diagnosis not present

## 2020-03-09 DIAGNOSIS — G35 Multiple sclerosis: Secondary | ICD-10-CM

## 2020-03-09 DIAGNOSIS — G6189 Other inflammatory polyneuropathies: Secondary | ICD-10-CM

## 2020-03-09 DIAGNOSIS — E559 Vitamin D deficiency, unspecified: Secondary | ICD-10-CM

## 2020-03-09 DIAGNOSIS — M5440 Lumbago with sciatica, unspecified side: Secondary | ICD-10-CM

## 2020-03-09 LAB — CBC WITH DIFFERENTIAL/PLATELET
Basophils Absolute: 0 10*3/uL (ref 0.0–0.1)
Basophils Relative: 0.9 % (ref 0.0–3.0)
Eosinophils Absolute: 0.1 10*3/uL (ref 0.0–0.7)
Eosinophils Relative: 1.2 % (ref 0.0–5.0)
HCT: 44.9 % (ref 39.0–52.0)
Hemoglobin: 14.9 g/dL (ref 13.0–17.0)
Lymphocytes Relative: 23.3 % (ref 12.0–46.0)
Lymphs Abs: 1.2 10*3/uL (ref 0.7–4.0)
MCHC: 33.1 g/dL (ref 30.0–36.0)
MCV: 92.3 fl (ref 78.0–100.0)
Monocytes Absolute: 0.5 10*3/uL (ref 0.1–1.0)
Monocytes Relative: 9.3 % (ref 3.0–12.0)
Neutro Abs: 3.3 10*3/uL (ref 1.4–7.7)
Neutrophils Relative %: 65.3 % (ref 43.0–77.0)
Platelets: 187 10*3/uL (ref 150.0–400.0)
RBC: 4.87 Mil/uL (ref 4.22–5.81)
RDW: 13 % (ref 11.5–15.5)
WBC: 5.1 10*3/uL (ref 4.0–10.5)

## 2020-03-09 LAB — COMPREHENSIVE METABOLIC PANEL
ALT: 14 U/L (ref 0–53)
AST: 16 U/L (ref 0–37)
Albumin: 4.3 g/dL (ref 3.5–5.2)
Alkaline Phosphatase: 74 U/L (ref 39–117)
BUN: 14 mg/dL (ref 6–23)
CO2: 32 mEq/L (ref 19–32)
Calcium: 9.2 mg/dL (ref 8.4–10.5)
Chloride: 104 mEq/L (ref 96–112)
Creatinine, Ser: 0.78 mg/dL (ref 0.40–1.50)
GFR: 93.85 mL/min (ref >60.00)
Glucose, Bld: 93 mg/dL (ref 70–99)
Potassium: 4.2 mEq/L (ref 3.5–5.1)
Sodium: 139 mEq/L (ref 135–145)
Total Bilirubin: 0.4 mg/dL (ref 0.2–1.2)
Total Protein: 7.1 g/dL (ref 6.0–8.3)

## 2020-03-09 MED ORDER — BACLOFEN 20 MG PO TABS
ORAL_TABLET | ORAL | 3 refills | Status: DC
Start: 2020-03-09 — End: 2021-01-19

## 2020-03-09 NOTE — Assessment & Plan Note (Signed)
Cont w/Protonix 

## 2020-03-09 NOTE — Assessment & Plan Note (Signed)
Recurrent loose stools CMET  CBC

## 2020-03-09 NOTE — Assessment & Plan Note (Signed)
Chronic  On Tramadol  Potential benefits of a long term Tramadol/opioids use as well as potential risks (i.e. addiction risk, apnea etc) and complications (i.e. Somnolence, constipation and others) were explained to the patient and were aknowledged. On Baclofen 

## 2020-03-09 NOTE — Patient Instructions (Signed)
For a mild COVID-19 case you can take zinc 50 mg a day for 1 week, vitamin C 1000 mg daily for 1 week, vitamin D2 50,000 units weekly for 2 months (unless you are taking vitamin D daily already), Quercetin 500 mg twice a day for 1 week (if you can get it quick enough).  Maintain good oral hydration and take Tylenol with high fever.   You can buy COVID-19 express tests for home use. They are inexpensive, roughly $15 for 2 tests.  There is a link below: [email or web address]-kit.[email or web address]  You can order 4 free COVID-19 home tests via [employer]  

## 2020-03-09 NOTE — Progress Notes (Signed)
Subjective:  Patient ID: [name], male    DOB: 1955/11/16  Age: 65 y.o. MRN: [id number]  CC: No chief complaint on file.   HPI Temitayo Covalt presents for MS, loose stools at times, GERD f/u  Outpatient Medications Prior to Visit  Medication Sig Dispense Refill  . amitriptyline (ELAVIL) 25 MG tablet TAKE 2 TABLETS AT BEDTIME AS NEEDED FOR SLEEP (OFFICE VISIT NEEDED BD REFILLS WILL BE GIVEN) 180 tablet 3  . baclofen (LIORESAL) 20 MG tablet TAKE 1 TABLET FOUR TIMES A DAY AS NEEDED FOR MUSCLE SPASMS (OFFICE VISIT NEEDED BEFORE REFILLS WILL BE GIVEN) 360 tablet 3  . cetirizine (ZYRTEC) 10 MG tablet Take 10 mg by mouth daily as needed for allergies.    . cholecalciferol (VITAMIN D) 1000 units tablet Take 1,000 Units by mouth daily.    . Cholecalciferol 50 MCG (2000 UT) CAPS Take 1 capsule by mouth daily.    . clotrimazole-betamethasone (LOTRISONE) cream APPLY 1 APPLICATION TOPICALLY TWICE A DAY AS NEEDED FOR IRRITATION 30 g 2  . diphenoxylate-atropine (LOMOTIL) 2.5-0.025 MG tablet Take 1 tablet by mouth 4 (four) times daily as needed for diarrhea or loose stools. 40 tablet 0  . fluticasone (FLONASE) 50 MCG/ACT nasal spray Place 2 sprays into both nostrils daily as needed for allergies or rhinitis.    [name] gabapentin (NEURONTIN) 100 MG capsule TAKE 2 CAPSULES THREE TIMES A DAY AS NEEDED FOR PAIN (Patient taking differently: Take 200 mg by mouth at bedtime.) 360 capsule 5  . Multiple Vitamin (MULTIVITAMIN) capsule Take 1 tablet by mouth daily.    . ondansetron (ZOFRAN ODT) 4 MG disintegrating tablet Take 1 tablet (4 mg total) by mouth every 8 (eight) hours as needed for nausea or vomiting. 20 tablet 0  . pantoprazole (PROTONIX) 40 MG tablet Take 1 tablet (40 mg total) by mouth daily. 90 tablet 3  . Probiotic Product (ALIGN) 4 MG CAPS Take 1 capsule (4 mg total) by mouth daily. 30 capsule 11  . tadalafil (CIALIS) 5 MG tablet Take 1 tablet (5 mg total) by mouth daily. 90 tablet 3  . TraMADol HCl  200 MG CP24 Take 1 capsule by mouth daily. 90 capsule 1   No facility-administered medications prior to visit.    ROS: Review of Systems  Constitutional: Positive for fatigue. Negative for appetite change and unexpected weight change.  HENT: Negative for congestion, nosebleeds, sneezing, sore throat and trouble swallowing.   Eyes: Negative for itching and visual disturbance.  Respiratory: Negative for cough.   Cardiovascular: Negative for chest pain, palpitations and leg swelling.  Gastrointestinal: Negative for abdominal distention, blood in stool, diarrhea and nausea.  Genitourinary: Negative for frequency and hematuria.  Musculoskeletal: Positive for arthralgias, back pain and gait problem. Negative for joint swelling and neck pain.  Skin: Negative for rash.  Neurological: Negative for dizziness, tremors, speech difficulty and weakness.  Psychiatric/Behavioral: Negative for agitation, dysphoric mood, sleep disturbance and suicidal ideas. The patient is not nervous/anxious.     Objective:  BP 124/80   Pulse 77   Temp 98.4 F (36.9 C) (Oral)   Ht 5\' 7"  (1.702 m)   Wt 154 lb (69.9 kg)   SpO2 97%   BMI 24.12 kg/m   BP Readings from Last 3 Encounters:  03/09/20 124/80  12/08/19 140/90  11/12/19 118/82    Wt Readings from Last 3 Encounters:  03/09/20 154 lb (69.9 kg)  12/08/19 156 lb 3.2 oz (70.9 kg)  11/12/19 155 lb 6.4 oz (70.5  kg)    Physical Exam Constitutional:      General: He is not in acute distress.    Appearance: He is well-developed.     Comments: NAD  HENT:     Mouth/Throat:     Mouth: Oropharynx is clear and moist.  Eyes:     Conjunctiva/sclera: Conjunctivae normal.     Pupils: Pupils are equal, round, and reactive to light.  Neck:     Thyroid: No thyromegaly.     Vascular: No JVD.  Cardiovascular:     Rate and Rhythm: Normal rate and regular rhythm.     Pulses: Intact distal pulses.     Heart sounds: Normal heart sounds. No murmur heard. No  friction rub. No gallop.   Pulmonary:     Effort: Pulmonary effort is normal. No respiratory distress.     Breath sounds: Normal breath sounds. No wheezing or rales.  Chest:     Chest wall: No tenderness.  Abdominal:     General: Bowel sounds are normal. There is no distension.     Palpations: Abdomen is soft. There is no mass.     Tenderness: There is no abdominal tenderness. There is no guarding or rebound.  Musculoskeletal:        General: No tenderness or edema. Normal range of motion.     Cervical back: Normal range of motion.  Lymphadenopathy:     Cervical: No cervical adenopathy.  Skin:    General: Skin is warm and dry.     Findings: No rash.  Neurological:     Mental Status: He is alert and oriented to person, place, and time.     Cranial Nerves: No cranial nerve deficit.     Motor: Weakness present. No abnormal muscle tone.     Coordination: He displays a negative Romberg sign. Coordination abnormal.     Gait: Gait abnormal.     Deep Tendon Reflexes: Reflexes are normal and symmetric.  Psychiatric:        Mood and Affect: Mood and affect normal.        Behavior: Behavior normal.        Thought Content: Thought content normal.        Judgment: Judgment normal.    Using trekking poles  Lab Results  Component Value Date   WBC 4.7 09/22/2019   HGB 13.1 (L) 09/22/2019   HCT 39.7 09/22/2019   PLT 205 09/22/2019   GLUCOSE 99 09/22/2019   CHOL 130 07/11/2019   TRIG 49.0 07/11/2019   HDL 39.40 07/11/2019   LDLDIRECT 118.4 06/09/2009   LDLCALC 81 07/11/2019   ALT 29 09/22/2019   AST 22 09/22/2019   NA 140 09/22/2019   K 4.4 09/22/2019   CL 103 09/22/2019   CREATININE 0.75 09/22/2019   BUN 12 09/22/2019   CO2 32 09/22/2019   TSH 3.05 07/11/2019   PSA 3.78 07/11/2019   INR 1.0 08/24/2008   HGBA1C 5.8 07/11/2019    CT ABDOMEN PELVIS W CONTRAST  Result Date: 08/29/2019 CLINICAL DATA:  Diarrhea for 3 months EXAM: CT ABDOMEN AND PELVIS WITH CONTRAST TECHNIQUE:  Multidetector CT imaging of the abdomen and pelvis was performed using the standard protocol following bolus administration of intravenous contrast. CONTRAST:  123mL OMNIPAQUE IOHEXOL 300 MG/ML  SOLN COMPARISON:  01/31/2018 FINDINGS: Lower chest: No acute pleural or parenchymal lung disease. Hepatobiliary: Gallbladder is surgically absent. Left lobe of the liver is atrophic, a chronic finding. Remainder of the liver is unremarkable. Pancreas: Unremarkable.  No pancreatic ductal dilatation or surrounding inflammatory changes. Spleen: Normal in size without focal abnormality. Adrenals/Urinary Tract: Stable right renal cysts. Kidneys otherwise unremarkable. Bladder is moderately distended with no focal abnormality. The adrenals are normal. Stomach/Bowel: No bowel obstruction or ileus. There is mild wall thickening of the descending colon from the splenic flexure through the rectosigmoid junction, with mild pericolonic fat stranding. Findings are consistent with inflammatory or infectious colitis. Vascular/Lymphatic: No significant vascular findings are present. No enlarged abdominal or pelvic lymph nodes. Reproductive: Mild enlargement of the prostate, stable. Other: No abdominal wall hernia or abnormality. No abdominopelvic ascites. Musculoskeletal: No acute or destructive bony lesions. Reconstructed images demonstrate no additional findings. IMPRESSION: 1. Inflammatory or infectious colitis involving the descending colon. 2. Stable mild enlargement of the prostate. Electronically Signed   By: Randa Ngo M.D.   On: 08/29/2019 18:58    Assessment & Plan:    Walker Kehr, MD

## 2020-03-09 NOTE — Addendum Note (Signed)
Addended by: Raliegh Ip on: 03/09/2020 02:17 PM   Modules accepted: Orders

## 2020-03-09 NOTE — Assessment & Plan Note (Signed)
Tramadol prn Gabapentin 

## 2020-03-09 NOTE — Assessment & Plan Note (Signed)
Vit D 

## 2020-03-29 ENCOUNTER — Other Ambulatory Visit: Payer: Self-pay

## 2020-03-29 ENCOUNTER — Ambulatory Visit (INDEPENDENT_AMBULATORY_CARE_PROVIDER_SITE_OTHER): Payer: Medicare Other

## 2020-03-29 VITALS — BP 120/70 | HR 84 | Temp 98.3°F | Ht 67.0 in | Wt 161.2 lb

## 2020-03-29 DIAGNOSIS — Z0000 Encounter for general adult medical examination without abnormal findings: Secondary | ICD-10-CM | POA: Diagnosis not present

## 2020-03-29 NOTE — Patient Instructions (Signed)
[name] , Thank you for taking time to come for your Medicare Wellness Visit. I appreciate your ongoing commitment to your health goals. Please review the following plan we discussed and let me know if I can assist you in the future.   Screening recommendations/referrals: Colonoscopy: 09/02/2019; due every 3 years Recommended yearly ophthalmology/optometry visit for glaucoma screening and checkup Recommended yearly dental visit for hygiene and checkup  Vaccinations: Influenza vaccine: 12/08/2019 Pneumococcal vaccine: up to date Tdap vaccine: 09/04/2012; due every 10 years Shingles vaccine: never done; can check with local pharmacy for vaccine. Covid-19: 04/24/2019, 05/20/2019, 09/29/2019  Advanced directives: Please bring a copy of your health care power of attorney and living will to the office at your convenience.  Conditions/risks identified: Yes; Reviewed health maintenance screenings with patient today and relevant education, vaccines, and/or referrals were provided. Please continue to do your personal lifestyle choices by: daily care of teeth and gums, regular physical activity (goal should be 5 days a week for 30 minutes), eat a healthy diet, avoid tobacco and drug use, limiting any alcohol intake, taking a low-dose aspirin (if not allergic or have been advised by your provider otherwise) and taking vitamins and minerals as recommended by your provider. Continue doing brain stimulating activities (puzzles, reading, adult coloring books, staying active) to keep memory sharp. Continue to eat heart healthy diet (full of fruits, vegetables, whole grains, lean protein, water--limit salt, fat, and sugar intake) and increase physical activity as tolerated.  Next appointment: Please schedule your next Medicare Wellness Visit with your Nurse Health Advisor in 1 year by calling [phone].  Preventive Care 43 Years and Older, Male Preventive care refers to lifestyle choices and visits with your  health care provider that can promote health and wellness. What does preventive care include?  A yearly physical exam. This is also called an annual well check.  Dental exams once or twice a year.  Routine eye exams. Ask your health care provider how often you should have your eyes checked.  Personal lifestyle choices, including:  Daily care of your teeth and gums.  Regular physical activity.  Eating a healthy diet.  Avoiding tobacco and drug use.  Limiting alcohol use.  Practicing safe sex.  Taking low doses of aspirin every day.  Taking vitamin and mineral supplements as recommended by your health care provider. What happens during an annual well check? The services and screenings done by your health care provider during your annual well check will depend on your age, overall health, lifestyle risk factors, and family history of disease. Counseling  Your health care provider may ask you questions about your:  Alcohol use.  Tobacco use.  Drug use.  Emotional well-being.  Home and relationship well-being.  Sexual activity.  Eating habits.  History of falls.  Memory and ability to understand (cognition).  Work and work Statistician. Screening  You may have the following tests or measurements:  Height, weight, and BMI.  Blood pressure.  Lipid and cholesterol levels. These may be checked every 5 years, or more frequently if you are over 61 years old.  Skin check.  Lung cancer screening. You may have this screening every year starting at age 71 if you have a 30-pack-year history of smoking and currently smoke or have quit within the past 15 years.  Fecal occult blood test (FOBT) of the stool. You may have this test every year starting at age 59.  Flexible sigmoidoscopy or colonoscopy. You may have a sigmoidoscopy every 5 years or  a colonoscopy every 10 years starting at age 68.  Prostate cancer screening. Recommendations will vary depending on your family  history and other risks.  Hepatitis C blood test.  Hepatitis B blood test.  Sexually transmitted disease (STD) testing.  Diabetes screening. This is done by checking your blood sugar (glucose) after you have not eaten for a while (fasting). You may have this done every 1-3 years.  Abdominal aortic aneurysm (AAA) screening. You may need this if you are a current or former smoker.  Osteoporosis. You may be screened starting at age 65 if you are at high risk. Talk with your health care provider about your test results, treatment options, and if necessary, the need for more tests. Vaccines  Your health care provider may recommend certain vaccines, such as:  Influenza vaccine. This is recommended every year.  Tetanus, diphtheria, and acellular pertussis (Tdap, Td) vaccine. You may need a Td booster every 10 years.  Zoster vaccine. You may need this after age 8.  Pneumococcal 13-valent conjugate (PCV13) vaccine. One dose is recommended after age 90.  Pneumococcal polysaccharide (PPSV23) vaccine. One dose is recommended after age 10. Talk to your health care provider about which screenings and vaccines you need and how often you need them. This information is not intended to replace advice given to you by your health care provider. Make sure you discuss any questions you have with your health care provider. Document Released: 02/19/2015 Document Revised: 10/13/2015 Document Reviewed: 11/24/2014 Elsevier Interactive Patient Education  2017 [place] Prevention in the Home Falls can cause injuries. They can happen to people of all ages. There are many things you can do to make your home safe and to help prevent falls. What can I do on the outside of my home?  Regularly fix the edges of walkways and driveways and fix any cracks.  Remove anything that might make you trip as you walk through a door, such as a raised step or threshold.  Trim any bushes or trees on the path to  your home.  Use bright outdoor lighting.  Clear any walking paths of anything that might make someone trip, such as rocks or tools.  Regularly check to see if handrails are loose or broken. Make sure that both sides of any steps have handrails.  Any raised decks and porches should have guardrails on the edges.  Have any leaves, snow, or ice cleared regularly.  Use sand or salt on walking paths during winter.  Clean up any spills in your garage right away. This includes oil or grease spills. What can I do in the bathroom?  Use night lights.  Install grab bars by the toilet and in the tub and shower. Do not use towel bars as grab bars.  Use non-skid mats or decals in the tub or shower.  If you need to sit down in the shower, use a plastic, non-slip stool.  Keep the floor dry. Clean up any water that spills on the floor as soon as it happens.  Remove soap buildup in the tub or shower regularly.  Attach bath mats securely with double-sided non-slip rug tape.  Do not have throw rugs and other things on the floor that can make you trip. What can I do in the bedroom?  Use night lights.  Make sure that you have a light by your bed that is easy to reach.  Do not use any sheets or blankets that are too big for your bed.  They should not hang down onto the floor.  Have a firm chair that has side arms. You can use this for support while you get dressed.  Do not have throw rugs and other things on the floor that can make you trip. What can I do in the kitchen?  Clean up any spills right away.  Avoid walking on wet floors.  Keep items that you use a lot in easy-to-reach places.  If you need to reach something above you, use a strong step stool that has a grab bar.  Keep electrical cords out of the way.  Do not use floor polish or wax that makes floors slippery. If you must use wax, use non-skid floor wax.  Do not have throw rugs and other things on the floor that can make  you trip. What can I do with my stairs?  Do not leave any items on the stairs.  Make sure that there are handrails on both sides of the stairs and use them. Fix handrails that are broken or loose. Make sure that handrails are as long as the stairways.  Check any carpeting to make sure that it is firmly attached to the stairs. Fix any carpet that is loose or worn.  Avoid having throw rugs at the top or bottom of the stairs. If you do have throw rugs, attach them to the floor with carpet tape.  Make sure that you have a light switch at the top of the stairs and the bottom of the stairs. If you do not have them, ask someone to add them for you. What else can I do to help prevent falls?  Wear shoes that:  Do not have high heels.  Have rubber bottoms.  Are comfortable and fit you well.  Are closed at the toe. Do not wear sandals.  If you use a stepladder:  Make sure that it is fully opened. Do not climb a closed stepladder.  Make sure that both sides of the stepladder are locked into place.  Ask someone to hold it for you, if possible.  Clearly mark and make sure that you can see:  Any grab bars or handrails.  First and last steps.  Where the edge of each step is.  Use tools that help you move around (mobility aids) if they are needed. These include:  Canes.  Walkers.  Scooters.  Crutches.  Turn on the lights when you go into a dark area. Replace any light bulbs as soon as they burn out.  Set up your furniture so you have a clear path. Avoid moving your furniture around.  If any of your floors are uneven, fix them.  If there are any pets around you, be aware of where they are.  Review your medicines with your doctor. Some medicines can make you feel dizzy. This can increase your chance of falling. Ask your doctor what other things that you can do to help prevent falls. This information is not intended to replace advice given to you by your health care provider.  Make sure you discuss any questions you have with your health care provider. Document Released: 11/19/2008 Document Revised: 07/01/2015 Document Reviewed: 02/27/2014 Elsevier Interactive Patient Education  2017 [employer].

## 2020-03-29 NOTE — Progress Notes (Addendum)
Subjective:   [name] is a 65 y.o. male who presents for Medicare Annual/Subsequent preventive examination.  Review of Systems    No ROS. Medicare Wellness Visit. Additional risk factors are reflected in social history. Cardiac Risk Factors include: advanced age (>64men, >4 women);male gender     Objective:    Today's Vitals   03/29/20 1035  BP: 120/70  Pulse: 84  Temp: 98.3 F (36.8 C)  SpO2: 97%  Weight: 161 lb 3.2 oz (73.1 kg)  Height: 5\' 7"  (1.702 m)  PainSc: 0-No pain   Body mass index is 25.25 kg/m.  Advanced Directives 03/29/2020 08/29/2019 08/29/2019 01/30/2018 03/29/2016 03/20/2016 03/10/2011  Does Patient Have a Medical Advance Directive? Yes Yes No Yes Yes No Patient has advance directive, copy not in chart  Type of Advance Directive - [place];Living will - Out of facility DNR (pink MOST or yellow form) [place];Living will - -  Does patient want to make changes to medical advance directive? No - Patient declined No - Patient declined - - - - -  Copy of [place] in Chart? - No - copy requested - - No - copy requested - -  Would patient like information on creating a medical advance directive? - No - Patient declined No - Patient declined - - No - Patient declined -    Current Medications (verified) Outpatient Encounter Medications as of 03/29/2020  Medication Sig   amitriptyline (ELAVIL) 25 MG tablet TAKE 2 TABLETS AT BEDTIME AS NEEDED FOR SLEEP (OFFICE VISIT NEEDED BD REFILLS WILL BE GIVEN)   baclofen (LIORESAL) 20 MG tablet TAKE 1 TABLET FOUR TIMES A DAY AS NEEDED FOR MUSCLE SPASMS (OFFICE VISIT NEEDED BEFORE REFILLS WILL BE GIVEN)   cetirizine (ZYRTEC) 10 MG tablet Take 10 mg by mouth daily as needed for allergies.   cholecalciferol (VITAMIN D) 1000 units tablet Take 1,000 Units by mouth daily.   Cholecalciferol 50 MCG (2000 UT) CAPS Take 1 capsule by mouth daily.   clotrimazole-betamethasone  (LOTRISONE) cream APPLY 1 APPLICATION TOPICALLY TWICE A DAY AS NEEDED FOR IRRITATION   diphenoxylate-atropine (LOMOTIL) 2.5-0.025 MG tablet Take 1 tablet by mouth 4 (four) times daily as needed for diarrhea or loose stools.   fluticasone (FLONASE) 50 MCG/ACT nasal spray Place 2 sprays into both nostrils daily as needed for allergies or rhinitis.   gabapentin (NEURONTIN) 100 MG capsule TAKE 2 CAPSULES THREE TIMES A DAY AS NEEDED FOR PAIN (Patient taking differently: Take 200 mg by mouth at bedtime.)   Multiple Vitamin (MULTIVITAMIN) capsule Take 1 tablet by mouth daily.   ondansetron (ZOFRAN ODT) 4 MG disintegrating tablet Take 1 tablet (4 mg total) by mouth every 8 (eight) hours as needed for nausea or vomiting.   pantoprazole (PROTONIX) 40 MG tablet Take 1 tablet (40 mg total) by mouth daily.   Probiotic Product (ALIGN) 4 MG CAPS Take 1 capsule (4 mg total) by mouth daily.   tadalafil (CIALIS) 5 MG tablet Take 1 tablet (5 mg total) by mouth daily.   TraMADol HCl 200 MG CP24 Take 1 capsule by mouth daily.   No facility-administered encounter medications on file as of 03/29/2020.    Allergies (verified) Meperidine, Ocrevus [ocrelizumab], and Prednisone   History: Past Medical History:  Diagnosis Date   Arthritis    Asthma    C. difficile diarrhea    Gallstone pancreatitis    Hernia    History of inguinal hernia repair  Low back pain    MS (multiple sclerosis) ([place])    Pancreatitis hosp[italized with gallstone pancreatitis 3/12[   Vitamin D deficiency    Past Surgical History:  Procedure Laterality Date   BIOPSY  09/02/2019   Procedure: BIOPSY;  Surgeon: Ladene Artist, MD;  Location: [facility];  Service: Endoscopy;;   CHOLECYSTECTOMY  04/2010   COLONOSCOPY WITH PROPOFOL N/A 09/02/2019   Procedure: COLONOSCOPY WITH PROPOFOL;  Surgeon: Ladene Artist, MD;  Location: [facility];  Service: Endoscopy;  Laterality: N/A;   CYSTECTOMY     on back and R hand   CYSTECTOMY      left arm   HERNIA REPAIR     x 2   INGUINAL HERNIA REPAIR  03/17/2011   Procedure: HERNIA REPAIR INGUINAL ADULT;  Surgeon: Adin Hector, MD;  Location: [facility];  Service: General;  Laterality: Left;  Open left inguinal hernia repair with mesh   KNEE ARTHROSCOPY     right   NOSE SURGERY     POLYPECTOMY  09/02/2019   Procedure: POLYPECTOMY;  Surgeon: Ladene Artist, MD;  Location: [facility];  Service: Endoscopy;;   TONSILLECTOMY     WISDOM TOOTH EXTRACTION     Family History  Problem Relation Age of Onset   Lung cancer Father        not related to smoking   Diabetes Mother    Colon cancer Neg Hx    Esophageal cancer Neg Hx    Pancreatic cancer Neg Hx    Stomach cancer Neg Hx    Liver disease Neg Hx    Social History   Socioeconomic History   Marital status: Married    Spouse name: Not on file   Number of children: 2   Years of education: Not on file   Highest education level: Not on file  Occupational History   Occupation: Health visitor: [employer]  Tobacco Use   Smoking status: Never Smoker   Smokeless tobacco: Never Used  Scientific laboratory technician Use: Never used  Substance and Sexual Activity   Alcohol use: No   Drug use: No   Sexual activity: Yes  Other Topics Concern   Not on file  Social History Narrative   Not on file   Social Determinants of Health   Financial Resource Strain: Low Risk    Difficulty of Paying Living Expenses: Not hard at all  Food Insecurity: No Food Insecurity   Worried About Charity fundraiser in the Last Year: Never true   Ran Out of Food in the Last Year: Never true  Transportation Needs: No Transportation Needs   Lack of Transportation (Medical): No   Lack of Transportation (Non-Medical): No  Physical Activity: Sufficiently Active   Days of Exercise per Week: 5 days   Minutes of Exercise per Session: 30 min  Stress: No Stress Concern Present   Feeling of Stress : Not at all  Social Connections:  Moderately Isolated   Frequency of Communication with Friends and Family: More than three times a week   Frequency of Social Gatherings with Friends and Family: Once a week   Attends Religious Services: Never   Marine scientist or Organizations: No   Attends Music therapist: Never   Marital Status: Married    Tobacco Counseling Counseling given: No   Clinical Intake:  Pre-visit preparation completed: Yes  Pain : No/denies pain Pain Score: 0-No pain  BMI - recorded: 25.25 Nutritional Status: BMI 25 -29 Overweight Nutritional Risks: None Diabetes: No  How often do you need to have someone help you when you read instructions, pamphlets, or other written materials from your doctor or pharmacy?: 1 - Never What is the last grade level you completed in school?: [employer]; some ollege courses while in [place]  Diabetic? no  Interpreter Needed?: No  Information entered by :: Lisette Abu, LPN   Activities of Daily Living In your present state of health, do you have any difficulty performing the following activities: 03/29/2020 08/29/2019  Hearing? N -  Vision? N -  Difficulty concentrating or making decisions? N -  Walking or climbing stairs? N -  Dressing or bathing? N -  Doing errands, shopping? N Y  Conservation officer, nature and eating ? N -  Using the Toilet? N -  In the past six months, have you accidently leaked urine? N -  Do you have problems with loss of bowel control? N -  Managing your Medications? N -  Managing your Finances? N -  Housekeeping or managing your Housekeeping? N -  Some recent data might be hidden    Patient Care Team: Plotnikov, Evie Lacks, MD as PCP - Toni Arthurs, MD as Referring Physician (Neurology) Milus Banister, MD as Attending Physician (Gastroenterology)  Indicate any recent Medical Services you may have received from other than [facility] providers in the past year (date may be approximate).      Assessment:   This is a routine wellness examination for [place].  Hearing/Vision screen No exam data present  Dietary issues and exercise activities discussed: Current Exercise Habits: Home exercise routine, Type of exercise: stretching;strength training/weights;Other - see comments (squats; does physical & occupational therpy exercises), Time (Minutes): 30, Frequency (Times/Week): 5, Weekly Exercise (Minutes/Week): 150, Intensity: Moderate, Exercise limited by: neurologic condition(s)  Goals      maintain current health status     Continue to be as active as possible and eat a healthy diet.       Depression Screen PHQ 2/9 Scores 03/29/2020 12/08/2019 06/05/2019 05/16/2017 03/29/2016 03/29/2016 03/29/2015  PHQ - 2 Score 0 0 0 0 0 0 0    Fall Risk Fall Risk  03/29/2020 12/08/2019 03/29/2016 03/29/2016  Falls in the past year? 1 1 Yes Yes  Number falls in past yr: 0 0 1 1  Injury with Fall? 1 0 No No  Risk for fall due to : Impaired mobility;Impaired balance/gait History of fall(s);Impaired mobility - -  Follow up - Falls evaluation completed - -    FALL RISK PREVENTION PERTAINING TO THE HOME:  Any stairs in or around the home? No  If so, are there any without handrails? No  Home free of loose throw rugs in walkways, pet beds, electrical cords, etc? Yes  Adequate lighting in your home to reduce risk of falls? Yes   ASSISTIVE DEVICES UTILIZED TO PREVENT FALLS:  Life alert? Yes  Use of a cane, walker or w/c? Yes  Grab bars in the bathroom? Yes  Shower chair or bench in shower? Yes  Elevated toilet seat or a handicapped toilet? Yes   TIMED UP AND GO:  Was the test performed? No .  Length of time to ambulate 10 feet: 0 sec.   Gait slow and steady with assistive device  Cognitive Function: Normal cognitive status assessed by direct observation by this Nurse Health Advisor. No abnormalities found.  Immunizations Immunization History  Administered Date(s)  Administered   Influenza Split 01/24/2011   Influenza Whole 11/07/2011   Influenza,inj,Quad PF,6+ Mos 11/15/2016, 11/14/2017, 11/16/2018, 12/08/2019   PFIZER(Purple Top)SARS-COV-2 Vaccination 04/24/2019, 05/20/2019, 09/29/2019   Pneumococcal Conjugate-13 03/27/2014   Pneumococcal Polysaccharide-23 06/14/2009   Tdap 09/04/2012   Zoster 03/29/2015    TDAP status: Up to date  Flu Vaccine status: Up to date  Pneumococcal vaccine status: Up to date  Covid-19 vaccine status: Completed vaccines  Qualifies for Shingles Vaccine? Yes   Zostavax completed Yes   Shingrix Completed?: No.    Education has been provided regarding the importance of this vaccine. Patient has been advised to call insurance company to determine out of pocket expense if they have not yet received this vaccine. Advised may also receive vaccine at local pharmacy or Health Dept. Verbalized acceptance and understanding.  Screening Tests Health Maintenance  Topic Date Due   Fecal DNA (Cologuard)  04/26/2019   PNA vac Low Risk Adult (2 of 2 - PPSV23) 01/31/2020   TETANUS/TDAP  09/05/2022   INFLUENZA VACCINE  Completed   COVID-19 Vaccine  Completed   Hepatitis C Screening  Completed   HIV Screening  Completed    Health Maintenance  Health Maintenance Due  Topic Date Due   Fecal DNA (Cologuard)  04/26/2019   PNA vac Low Risk Adult (2 of 2 - PPSV23) 01/31/2020    Colorectal cancer screening: Type of screening: Colonoscopy. Completed 09/02/2019. Repeat every 3 years  Lung Cancer Screening: (Low Dose CT Chest recommended if Age 48-80 years, 30 pack-year currently smoking OR have quit w/in 15years.) does not qualify.   Lung Cancer Screening Referral: no  Additional Screening:  Hepatitis C Screening: does not qualify; Completed no  Vision Screening: Recommended annual ophthalmology exams for early detection of glaucoma and other disorders of the eye. Is the patient up to date with their annual eye exam?  Yes   Who is the provider or what is the name of the office in which the patient attends annual eye exams? Clent Jacks, MD. If pt is not established with a provider, would they like to be referred to a provider to establish care? No .   Dental Screening: Recommended annual dental exams for proper oral hygiene  Community Resource Referral / Chronic Care Management: CRR required this visit?  No   CCM required this visit?  No      Plan:     I have personally reviewed and noted the following in the patient's chart:   Medical and social history Use of alcohol, tobacco or illicit drugs  Current medications and supplements Functional ability and status Nutritional status Physical activity Advanced directives List of other physicians Hospitalizations, surgeries, and ER visits in previous 12 months Vitals Screenings to include cognitive, depression, and falls Referrals and appointments  In addition, I have reviewed and discussed with patient certain preventive protocols, quality metrics, and best practice recommendations. A written personalized care plan for preventive services as well as general preventive health recommendations were provided to patient.     Sheral Flow, LPN   [id number]   Nurse Notes:  Medications reviewed with patient; no opioid use noted.  Medical screening examination/treatment/procedure(s) were performed by non-physician practitioner and as supervising physician I was immediately available for consultation/collaboration.  I agree with above. Lew Dawes, MD

## 2020-04-10 ENCOUNTER — Other Ambulatory Visit: Payer: Self-pay | Admitting: Internal Medicine

## 2020-04-10 MED ORDER — TRAMADOL HCL (ER BIPHASIC) 200 MG PO CP24: 1.0000 | ORAL_CAPSULE | Freq: Every day | ORAL | 1 refills | Status: DC

## 2020-05-05 DIAGNOSIS — G35 Multiple sclerosis: Secondary | ICD-10-CM | POA: Diagnosis not present

## 2020-05-08 ENCOUNTER — Other Ambulatory Visit: Payer: Self-pay | Admitting: Internal Medicine

## 2020-05-26 ENCOUNTER — Other Ambulatory Visit: Payer: Self-pay

## 2020-05-26 ENCOUNTER — Encounter: Payer: Self-pay | Admitting: Internal Medicine

## 2020-05-26 ENCOUNTER — Ambulatory Visit (INDEPENDENT_AMBULATORY_CARE_PROVIDER_SITE_OTHER): Payer: Medicare Other | Admitting: Internal Medicine

## 2020-05-26 DIAGNOSIS — J301 Allergic rhinitis due to pollen: Secondary | ICD-10-CM

## 2020-05-26 DIAGNOSIS — J4520 Mild intermittent asthma, uncomplicated: Secondary | ICD-10-CM

## 2020-05-26 DIAGNOSIS — E559 Vitamin D deficiency, unspecified: Secondary | ICD-10-CM

## 2020-05-26 DIAGNOSIS — R059 Cough, unspecified: Secondary | ICD-10-CM

## 2020-05-26 DIAGNOSIS — R519 Headache, unspecified: Secondary | ICD-10-CM | POA: Diagnosis not present

## 2020-05-26 DIAGNOSIS — J309 Allergic rhinitis, unspecified: Secondary | ICD-10-CM | POA: Insufficient documentation

## 2020-05-26 MED ORDER — KETOROLAC TROMETHAMINE 10 MG PO TABS: 10.0000 mg | ORAL_TABLET | Freq: Four times a day (QID) | ORAL | 0 refills | Status: DC | PRN

## 2020-05-26 MED ORDER — METHYLPREDNISOLONE 4 MG PO TBPK: ORAL_TABLET | ORAL | 0 refills | Status: DC

## 2020-05-26 NOTE — Assessment & Plan Note (Signed)
On Vit D 

## 2020-05-26 NOTE — Assessment & Plan Note (Signed)
Treat allergies ?

## 2020-05-26 NOTE — Assessment & Plan Note (Signed)
Worse seasonally Nasal rinse Benadryl and Afrin if needed Medrol pack

## 2020-05-26 NOTE — Assessment & Plan Note (Signed)
Treat allergies Toradol prn

## 2020-05-26 NOTE — Progress Notes (Signed)
Subjective:  Patient ID: [name], male    DOB: 1955/09/13  Age: 65 y.o. MRN: [id number]  CC: Cough and Sinusitis   HPI Lyndall Windt presents for cough, allergies, HAs Worse w/barometric pressure change  Outpatient Medications Prior to Visit  Medication Sig Dispense Refill  . amitriptyline (ELAVIL) 25 MG tablet TAKE 2 TABLETS AT BEDTIME AS NEEDED FOR SLEEP (OFFICE VISIT NEEDED BD REFILLS WILL BE GIVEN) 180 tablet 3  . baclofen (LIORESAL) 20 MG tablet TAKE 1 TABLET FOUR TIMES A DAY AS NEEDED FOR MUSCLE SPASMS (OFFICE VISIT NEEDED BEFORE REFILLS WILL BE GIVEN) 360 tablet 3  . cetirizine (ZYRTEC) 10 MG tablet Take 10 mg by mouth daily as needed for allergies.    . cholecalciferol (VITAMIN D) 1000 units tablet Take 1,000 Units by mouth daily.    . clotrimazole-betamethasone (LOTRISONE) cream APPLY 1 APPLICATION TOPICALLY TWICE A DAY AS NEEDED FOR IRRITATION 30 g 2  . diphenoxylate-atropine (LOMOTIL) 2.5-0.025 MG tablet Take 1 tablet by mouth 4 (four) times daily as needed for diarrhea or loose stools. 40 tablet 0  . fluticasone (FLONASE) 50 MCG/ACT nasal spray Place 2 sprays into both nostrils daily as needed for allergies or rhinitis.    [name] gabapentin (NEURONTIN) 100 MG capsule TAKE 2 CAPSULES THREE TIMES A DAY AS NEEDED FOR PAIN 360 capsule 5  . Multiple Vitamin (MULTIVITAMIN) capsule Take 1 tablet by mouth daily.    . ondansetron (ZOFRAN ODT) 4 MG disintegrating tablet Take 1 tablet (4 mg total) by mouth every 8 (eight) hours as needed for nausea or vomiting. 20 tablet 0  . pantoprazole (PROTONIX) 40 MG tablet Take 1 tablet (40 mg total) by mouth daily. 90 tablet 3  . Probiotic Product (ALIGN) 4 MG CAPS Take 1 capsule (4 mg total) by mouth daily. 30 capsule 11  . tadalafil (CIALIS) 5 MG tablet Take 1 tablet (5 mg total) by mouth daily. 90 tablet 3  . TraMADol HCl 200 MG CP24 Take 1 capsule by mouth daily. 90 capsule 1  . Cholecalciferol 50 MCG (2000 UT) CAPS Take 1 capsule by mouth  daily.     No facility-administered medications prior to visit.    ROS: Review of Systems  Constitutional: Positive for fatigue. Negative for appetite change and unexpected weight change.  HENT: Positive for rhinorrhea and sinus pressure. Negative for congestion, nosebleeds, sneezing, sore throat and trouble swallowing.   Eyes: Negative for itching and visual disturbance.  Respiratory: Positive for cough.   Cardiovascular: Negative for chest pain, palpitations and leg swelling.  Gastrointestinal: Negative for abdominal distention, blood in stool, diarrhea and nausea.  Genitourinary: Negative for frequency and hematuria.  Musculoskeletal: Positive for gait problem. Negative for back pain, joint swelling and neck pain.  Skin: Negative for rash.  Neurological: Positive for weakness. Negative for dizziness, tremors and speech difficulty.  Psychiatric/Behavioral: Negative for agitation, dysphoric mood and sleep disturbance. The patient is not nervous/anxious.     Objective:  BP 132/80 (BP Location: Left Arm)   Pulse 84   Temp 98.3 F (36.8 C) (Oral)   SpO2 98%   BP Readings from Last 3 Encounters:  05/26/20 132/80  03/29/20 120/70  03/09/20 124/80    Wt Readings from Last 3 Encounters:  03/29/20 161 lb 3.2 oz (73.1 kg)  03/09/20 154 lb (69.9 kg)  12/08/19 156 lb 3.2 oz (70.9 kg)    Physical Exam Constitutional:      General: He is not in acute distress.    Appearance:  He is well-developed.     Comments: NAD  HENT:     Mouth/Throat:     Pharynx: Oropharyngeal exudate present.  Eyes:     Conjunctiva/sclera: Conjunctivae normal.     Pupils: Pupils are equal, round, and reactive to light.  Neck:     Thyroid: No thyromegaly.     Vascular: No JVD.  Cardiovascular:     Rate and Rhythm: Normal rate and regular rhythm.     Heart sounds: Normal heart sounds. No murmur heard. No friction rub. No gallop.   Pulmonary:     Effort: Pulmonary effort is normal. No respiratory  distress.     Breath sounds: Normal breath sounds. No wheezing or rales.  Chest:     Chest wall: No tenderness.  Abdominal:     General: Bowel sounds are normal. There is no distension.     Palpations: Abdomen is soft. There is no mass.     Tenderness: There is no abdominal tenderness. There is no guarding or rebound.  Musculoskeletal:        General: No tenderness. Normal range of motion.     Cervical back: Normal range of motion.  Lymphadenopathy:     Cervical: No cervical adenopathy.  Skin:    General: Skin is warm and dry.     Findings: No rash.  Neurological:     Mental Status: He is alert and oriented to person, place, and time.     Cranial Nerves: No cranial nerve deficit.     Motor: No abnormal muscle tone.     Coordination: Coordination normal.     Gait: Gait normal.     Deep Tendon Reflexes: Reflexes are normal and symmetric.  Psychiatric:        Behavior: Behavior normal.        Thought Content: Thought content normal.        Judgment: Judgment normal.   eryth nares Using a walker  Lab Results  Component Value Date   WBC 5.1 03/09/2020   HGB 14.9 03/09/2020   HCT 44.9 03/09/2020   PLT 187.0 03/09/2020   GLUCOSE 93 03/09/2020   CHOL 130 07/11/2019   TRIG 49.0 07/11/2019   HDL 39.40 07/11/2019   LDLDIRECT 118.4 06/09/2009   LDLCALC 81 07/11/2019   ALT 14 03/09/2020   AST 16 03/09/2020   NA 139 03/09/2020   K 4.2 03/09/2020   CL 104 03/09/2020   CREATININE 0.78 03/09/2020   BUN 14 03/09/2020   CO2 32 03/09/2020   TSH 3.05 07/11/2019   PSA 3.78 07/11/2019   INR 1.0 08/24/2008   HGBA1C 5.8 07/11/2019    CT ABDOMEN PELVIS W CONTRAST  Result Date: 08/29/2019 CLINICAL DATA:  Diarrhea for 3 months EXAM: CT ABDOMEN AND PELVIS WITH CONTRAST TECHNIQUE: Multidetector CT imaging of the abdomen and pelvis was performed using the standard protocol following bolus administration of intravenous contrast. CONTRAST:  169mL OMNIPAQUE IOHEXOL 300 MG/ML  SOLN  COMPARISON:  01/31/2018 FINDINGS: Lower chest: No acute pleural or parenchymal lung disease. Hepatobiliary: Gallbladder is surgically absent. Left lobe of the liver is atrophic, a chronic finding. Remainder of the liver is unremarkable. Pancreas: Unremarkable. No pancreatic ductal dilatation or surrounding inflammatory changes. Spleen: Normal in size without focal abnormality. Adrenals/Urinary Tract: Stable right renal cysts. Kidneys otherwise unremarkable. Bladder is moderately distended with no focal abnormality. The adrenals are normal. Stomach/Bowel: No bowel obstruction or ileus. There is mild wall thickening of the descending colon from the splenic flexure through the  rectosigmoid junction, with mild pericolonic fat stranding. Findings are consistent with inflammatory or infectious colitis. Vascular/Lymphatic: No significant vascular findings are present. No enlarged abdominal or pelvic lymph nodes. Reproductive: Mild enlargement of the prostate, stable. Other: No abdominal wall hernia or abnormality. No abdominopelvic ascites. Musculoskeletal: No acute or destructive bony lesions. Reconstructed images demonstrate no additional findings. IMPRESSION: 1. Inflammatory or infectious colitis involving the descending colon. 2. Stable mild enlargement of the prostate. Electronically Signed   By: Randa Ngo M.D.   On: 08/29/2019 18:58    Assessment & Plan:   Walker Kehr, MD

## 2020-05-26 NOTE — Patient Instructions (Signed)
Benadryl and Afrin if needed

## 2020-07-06 ENCOUNTER — Encounter: Payer: Self-pay | Admitting: Internal Medicine

## 2020-07-06 ENCOUNTER — Other Ambulatory Visit: Payer: Self-pay

## 2020-07-06 ENCOUNTER — Ambulatory Visit (INDEPENDENT_AMBULATORY_CARE_PROVIDER_SITE_OTHER): Payer: Medicare Other | Admitting: Internal Medicine

## 2020-07-06 DIAGNOSIS — G8929 Other chronic pain: Secondary | ICD-10-CM

## 2020-07-06 DIAGNOSIS — Z23 Encounter for immunization: Secondary | ICD-10-CM | POA: Diagnosis not present

## 2020-07-06 DIAGNOSIS — T148XXA Other injury of unspecified body region, initial encounter: Secondary | ICD-10-CM

## 2020-07-06 DIAGNOSIS — M5440 Lumbago with sciatica, unspecified side: Secondary | ICD-10-CM

## 2020-07-06 DIAGNOSIS — A09 Infectious gastroenteritis and colitis, unspecified: Secondary | ICD-10-CM | POA: Diagnosis not present

## 2020-07-06 MED ORDER — FUROSEMIDE 20 MG PO TABS: 20.0000 mg | ORAL_TABLET | Freq: Every day | ORAL | 3 refills | Status: AC

## 2020-07-06 NOTE — Assessment & Plan Note (Signed)
Resolved

## 2020-07-06 NOTE — Patient Instructions (Signed)
Compression socks Try Furosemide for swelling Check on L heel blister daily

## 2020-07-06 NOTE — Assessment & Plan Note (Signed)
On Tramadol Potential benefits of a long term Tramadol/opioids use as well as potential risks (i.e. addiction risk, apnea etc) and complications (i.e. Somnolence, constipation and others) were explained to the patient and were aknowledged. On Baclofen

## 2020-07-06 NOTE — Assessment & Plan Note (Signed)
Blood blister L heel, dry 3x1.5 cm

## 2020-07-06 NOTE — Addendum Note (Signed)
Addended by: Earnstine Regal on: 07/06/2020 02:45 PM   Modules accepted: Orders

## 2020-07-06 NOTE — Progress Notes (Signed)
Subjective:  Patient ID: [name], male    DOB: 06/11/1955  Age: 65 y.o. MRN: [id number]  CC: Follow-up (4 month f/u)   HPI Adain Geurin presents for R leg swelling and a L heel blister x 2-3 d  Outpatient Medications Prior to Visit  Medication Sig Dispense Refill  . amitriptyline (ELAVIL) 25 MG tablet TAKE 2 TABLETS AT BEDTIME AS NEEDED FOR SLEEP (OFFICE VISIT NEEDED BD REFILLS WILL BE GIVEN) 180 tablet 3  . baclofen (LIORESAL) 20 MG tablet TAKE 1 TABLET FOUR TIMES A DAY AS NEEDED FOR MUSCLE SPASMS (OFFICE VISIT NEEDED BEFORE REFILLS WILL BE GIVEN) 360 tablet 3  . cetirizine (ZYRTEC) 10 MG tablet Take 10 mg by mouth daily as needed for allergies.    . cholecalciferol (VITAMIN D) 1000 units tablet Take 1,000 Units by mouth daily.    . clotrimazole-betamethasone (LOTRISONE) cream APPLY 1 APPLICATION TOPICALLY TWICE A DAY AS NEEDED FOR IRRITATION 30 g 2  . diphenoxylate-atropine (LOMOTIL) 2.5-0.025 MG tablet Take 1 tablet by mouth 4 (four) times daily as needed for diarrhea or loose stools. 40 tablet 0  . fluticasone (FLONASE) 50 MCG/ACT nasal spray Place 2 sprays into both nostrils daily as needed for allergies or rhinitis.    [name] gabapentin (NEURONTIN) 100 MG capsule TAKE 2 CAPSULES THREE TIMES A DAY AS NEEDED FOR PAIN 360 capsule 5  . ketorolac (TORADOL) 10 MG tablet Take 1 tablet (10 mg total) by mouth every 6 (six) hours as needed for severe pain. 20 tablet 0  . Multiple Vitamin (MULTIVITAMIN) capsule Take 1 tablet by mouth daily.    . pantoprazole (PROTONIX) 40 MG tablet Take 1 tablet (40 mg total) by mouth daily. 90 tablet 3  . Probiotic Product (ALIGN) 4 MG CAPS Take 1 capsule (4 mg total) by mouth daily. 30 capsule 11  . tadalafil (CIALIS) 5 MG tablet Take 1 tablet (5 mg total) by mouth daily. 90 tablet 3  . TraMADol HCl 200 MG CP24 Take 1 capsule by mouth daily. 90 capsule 1  . methylPREDNISolone (MEDROL DOSEPAK) 4 MG TBPK tablet As directed (Patient not taking: Reported on  07/06/2020) 21 tablet 0  . ondansetron (ZOFRAN ODT) 4 MG disintegrating tablet Take 1 tablet (4 mg total) by mouth every 8 (eight) hours as needed for nausea or vomiting. (Patient not taking: Reported on 07/06/2020) 20 tablet 0   No facility-administered medications prior to visit.    ROS: Review of Systems  Constitutional: Negative for appetite change, fatigue and unexpected weight change.  HENT: Negative for congestion, nosebleeds, sneezing, sore throat and trouble swallowing.   Eyes: Negative for itching and visual disturbance.  Respiratory: Negative for cough.   Cardiovascular: Positive for leg swelling. Negative for chest pain and palpitations.  Gastrointestinal: Negative for abdominal distention, blood in stool, diarrhea and nausea.  Genitourinary: Negative for frequency and hematuria.  Musculoskeletal: Positive for arthralgias and gait problem. Negative for back pain, joint swelling and neck pain.  Skin: Positive for color change and wound. Negative for rash.  Neurological: Negative for dizziness, tremors, speech difficulty and weakness.  Hematological: Does not bruise/bleed easily.  Psychiatric/Behavioral: Negative for agitation, dysphoric mood, sleep disturbance and suicidal ideas. The patient is nervous/anxious.     Objective:  BP (!) 144/80 (BP Location: Left Arm)   Pulse 90   Temp 98.5 F (36.9 C) (Oral)   Ht 5\' 7"  (1.702 m)   Wt 164 lb 12.8 oz (74.8 kg)   SpO2 96%   BMI 25.81  kg/m   BP Readings from Last 3 Encounters:  07/06/20 (!) 144/80  05/26/20 132/80  03/29/20 120/70    Wt Readings from Last 3 Encounters:  07/06/20 164 lb 12.8 oz (74.8 kg)  03/29/20 161 lb 3.2 oz (73.1 kg)  03/09/20 154 lb (69.9 kg)    Physical Exam Constitutional:      General: He is not in acute distress.    Appearance: He is well-developed.     Comments: NAD  Eyes:     Conjunctiva/sclera: Conjunctivae normal.     Pupils: Pupils are equal, round, and reactive to light.  Neck:      Thyroid: No thyromegaly.     Vascular: No JVD.  Cardiovascular:     Rate and Rhythm: Normal rate and regular rhythm.     Heart sounds: Normal heart sounds. No murmur heard. No friction rub. No gallop.   Pulmonary:     Effort: Pulmonary effort is normal. No respiratory distress.     Breath sounds: Normal breath sounds. No wheezing or rales.  Chest:     Chest wall: No tenderness.  Abdominal:     General: Bowel sounds are normal. There is no distension.     Palpations: Abdomen is soft. There is no mass.     Tenderness: There is no abdominal tenderness. There is no guarding or rebound.  Musculoskeletal:        General: Tenderness present. Normal range of motion.     Cervical back: Normal range of motion.  Lymphadenopathy:     Cervical: No cervical adenopathy.  Skin:    General: Skin is warm and dry.     Findings: No rash.  Neurological:     Mental Status: He is alert and oriented to person, place, and time.     Cranial Nerves: No cranial nerve deficit.     Motor: No abnormal muscle tone.     Coordination: Coordination abnormal.     Gait: Gait abnormal.     Deep Tendon Reflexes: Reflexes are normal and symmetric.  Psychiatric:        Behavior: Behavior normal.        Thought Content: Thought content normal.        Judgment: Judgment normal.    1-2+ B leg swelling, NT L ankle brace Blood blister L heel, dry 3x1.5 cm   Lab Results  Component Value Date   WBC 5.1 03/09/2020   HGB 14.9 03/09/2020   HCT 44.9 03/09/2020   PLT 187.0 03/09/2020   GLUCOSE 93 03/09/2020   CHOL 130 07/11/2019   TRIG 49.0 07/11/2019   HDL 39.40 07/11/2019   LDLDIRECT 118.4 06/09/2009   LDLCALC 81 07/11/2019   ALT 14 03/09/2020   AST 16 03/09/2020   NA 139 03/09/2020   K 4.2 03/09/2020   CL 104 03/09/2020   CREATININE 0.78 03/09/2020   BUN 14 03/09/2020   CO2 32 03/09/2020   TSH 3.05 07/11/2019   PSA 3.78 07/11/2019   INR 1.0 08/24/2008   HGBA1C 5.8 07/11/2019    CT ABDOMEN PELVIS W  CONTRAST  Result Date: 08/29/2019 CLINICAL DATA:  Diarrhea for 3 months EXAM: CT ABDOMEN AND PELVIS WITH CONTRAST TECHNIQUE: Multidetector CT imaging of the abdomen and pelvis was performed using the standard protocol following bolus administration of intravenous contrast. CONTRAST:  170mL OMNIPAQUE IOHEXOL 300 MG/ML  SOLN COMPARISON:  01/31/2018 FINDINGS: Lower chest: No acute pleural or parenchymal lung disease. Hepatobiliary: Gallbladder is surgically absent. Left lobe of the liver is  atrophic, a chronic finding. Remainder of the liver is unremarkable. Pancreas: Unremarkable. No pancreatic ductal dilatation or surrounding inflammatory changes. Spleen: Normal in size without focal abnormality. Adrenals/Urinary Tract: Stable right renal cysts. Kidneys otherwise unremarkable. Bladder is moderately distended with no focal abnormality. The adrenals are normal. Stomach/Bowel: No bowel obstruction or ileus. There is mild wall thickening of the descending colon from the splenic flexure through the rectosigmoid junction, with mild pericolonic fat stranding. Findings are consistent with inflammatory or infectious colitis. Vascular/Lymphatic: No significant vascular findings are present. No enlarged abdominal or pelvic lymph nodes. Reproductive: Mild enlargement of the prostate, stable. Other: No abdominal wall hernia or abnormality. No abdominopelvic ascites. Musculoskeletal: No acute or destructive bony lesions. Reconstructed images demonstrate no additional findings. IMPRESSION: 1. Inflammatory or infectious colitis involving the descending colon. 2. Stable mild enlargement of the prostate. Electronically Signed   By: Randa Ngo M.D.   On: 08/29/2019 18:58    Assessment & Plan:    Walker Kehr, MD

## 2020-08-17 ENCOUNTER — Other Ambulatory Visit: Payer: Self-pay | Admitting: Internal Medicine

## 2020-08-17 MED ORDER — CLOTRIMAZOLE-BETAMETHASONE 1-0.05 % EX CREA: TOPICAL_CREAM | CUTANEOUS | 2 refills | Status: DC

## 2020-09-03 MED ORDER — TADALAFIL 5 MG PO TABS: 5.0000 mg | ORAL_TABLET | Freq: Every day | ORAL | 0 refills | Status: DC

## 2020-09-13 DIAGNOSIS — R252 Cramp and spasm: Secondary | ICD-10-CM | POA: Diagnosis not present

## 2020-09-13 DIAGNOSIS — G8929 Other chronic pain: Secondary | ICD-10-CM | POA: Diagnosis not present

## 2020-09-13 DIAGNOSIS — Z79899 Other long term (current) drug therapy: Secondary | ICD-10-CM | POA: Diagnosis not present

## 2020-09-13 DIAGNOSIS — R269 Unspecified abnormalities of gait and mobility: Secondary | ICD-10-CM | POA: Diagnosis not present

## 2020-09-13 DIAGNOSIS — G35 Multiple sclerosis: Secondary | ICD-10-CM | POA: Diagnosis not present

## 2020-09-28 ENCOUNTER — Other Ambulatory Visit: Payer: Self-pay | Admitting: Internal Medicine

## 2020-09-28 MED ORDER — TRAMADOL HCL (ER BIPHASIC) 200 MG PO CP24: 1.0000 | ORAL_CAPSULE | Freq: Every day | ORAL | 1 refills | Status: DC

## 2020-09-28 NOTE — Progress Notes (Signed)
Tramadol renewed.

## 2020-10-06 ENCOUNTER — Encounter: Payer: Self-pay | Admitting: Internal Medicine

## 2020-10-06 ENCOUNTER — Other Ambulatory Visit: Payer: Self-pay

## 2020-10-06 ENCOUNTER — Ambulatory Visit (INDEPENDENT_AMBULATORY_CARE_PROVIDER_SITE_OTHER): Payer: Medicare Other | Admitting: Internal Medicine

## 2020-10-06 VITALS — BP 122/80 | HR 86 | Temp 98.4°F | Ht 67.0 in | Wt 161.4 lb

## 2020-10-06 DIAGNOSIS — Z8619 Personal history of other infectious and parasitic diseases: Secondary | ICD-10-CM

## 2020-10-06 DIAGNOSIS — Z0000 Encounter for general adult medical examination without abnormal findings: Secondary | ICD-10-CM

## 2020-10-06 DIAGNOSIS — R269 Unspecified abnormalities of gait and mobility: Secondary | ICD-10-CM

## 2020-10-06 DIAGNOSIS — M5440 Lumbago with sciatica, unspecified side: Secondary | ICD-10-CM

## 2020-10-06 DIAGNOSIS — G35 Multiple sclerosis: Secondary | ICD-10-CM

## 2020-10-06 DIAGNOSIS — N320 Bladder-neck obstruction: Secondary | ICD-10-CM

## 2020-10-06 DIAGNOSIS — E785 Hyperlipidemia, unspecified: Secondary | ICD-10-CM

## 2020-10-06 DIAGNOSIS — G8929 Other chronic pain: Secondary | ICD-10-CM | POA: Diagnosis not present

## 2020-10-06 MED ORDER — ALIGN 4 MG PO CAPS: 1.0000 | ORAL_CAPSULE | Freq: Every day | ORAL | 3 refills | Status: AC

## 2020-10-06 MED ORDER — PANTOPRAZOLE SODIUM 40 MG PO TBEC: 40.0000 mg | DELAYED_RELEASE_TABLET | Freq: Every day | ORAL | 3 refills | Status: DC

## 2020-10-06 NOTE — Assessment & Plan Note (Signed)
On Align probiotic

## 2020-10-06 NOTE — Progress Notes (Signed)
Subjective:  Patient ID: [name], male    DOB: May 09, 1955  Age: 65 y.o. MRN: [id number]  CC: Follow-up (3 month f/u)   HPI [name] presents for MS, weakness, stiffness, fatigue   Outpatient Medications Prior to Visit  Medication Sig Dispense Refill   amitriptyline (ELAVIL) 25 MG tablet TAKE 2 TABLETS AT BEDTIME AS NEEDED FOR SLEEP Keep F/u appt for future refills 180 tablet 0   baclofen (LIORESAL) 20 MG tablet TAKE 1 TABLET FOUR TIMES A DAY AS NEEDED FOR MUSCLE SPASMS (OFFICE VISIT NEEDED BEFORE REFILLS WILL BE GIVEN) 360 tablet 3   cetirizine (ZYRTEC) 10 MG tablet Take 10 mg by mouth daily as needed for allergies.     cholecalciferol (VITAMIN D) 1000 units tablet Take 1,000 Units by mouth daily.     clotrimazole-betamethasone (LOTRISONE) cream APPLY 1 APPLICATION TOPICALLY TWICE A DAY AS NEEDED FOR IRRITATION 30 g 2   fluticasone (FLONASE) 50 MCG/ACT nasal spray Place 2 sprays into both nostrils daily as needed for allergies or rhinitis.     furosemide (LASIX) 20 MG tablet Take 1 tablet (20 mg total) by mouth daily. 30 tablet 3   gabapentin (NEURONTIN) 100 MG capsule TAKE 2 CAPSULES THREE TIMES A DAY AS NEEDED FOR PAIN 360 capsule 5   Multiple Vitamin (MULTIVITAMIN) capsule Take 1 tablet by mouth daily.     pantoprazole (PROTONIX) 40 MG tablet Take 1 tablet (40 mg total) by mouth daily. 90 tablet 3   Probiotic Product (ALIGN) 4 MG CAPS Take 1 capsule (4 mg total) by mouth daily. 30 capsule 11   tadalafil (CIALIS) 5 MG tablet Take 1 tablet (5 mg total) by mouth daily. Annual appt due in Nov must see provider for future refills 90 tablet 0   TraMADol HCl 200 MG CP24 Take 1 capsule by mouth daily. 90 capsule 1   diphenoxylate-atropine (LOMOTIL) 2.5-0.025 MG tablet Take 1 tablet by mouth 4 (four) times daily as needed for diarrhea or loose stools. (Patient not taking: Reported on 10/06/2020) 40 tablet 0   ketorolac (TORADOL) 10 MG tablet Take 1 tablet (10 mg total) by mouth every  6 (six) hours as needed for severe pain. (Patient not taking: Reported on 10/06/2020) 20 tablet 0   No facility-administered medications prior to visit.    ROS: Review of Systems  Constitutional:  Negative for appetite change, fatigue and unexpected weight change.  HENT:  Negative for congestion, nosebleeds, sneezing, sore throat and trouble swallowing.   Eyes:  Negative for itching and visual disturbance.  Respiratory:  Negative for cough.   Cardiovascular:  Negative for chest pain, palpitations and leg swelling.  Gastrointestinal:  Negative for abdominal distention, blood in stool, diarrhea and nausea.  Genitourinary:  Negative for frequency and hematuria.  Musculoskeletal:  Positive for gait problem and myalgias. Negative for back pain, joint swelling and neck pain.  Skin:  Negative for rash.  Neurological:  Positive for weakness. Negative for dizziness, tremors and speech difficulty.  Psychiatric/Behavioral:  Negative for agitation, dysphoric mood, sleep disturbance and suicidal ideas. The patient is not nervous/anxious.    Objective:  BP 122/80 (BP Location: Left Arm)   Pulse 86   Temp 98.4 F (36.9 C) (Oral)   Ht '5\' 7"'$  (1.702 m)   Wt 161 lb 6.4 oz (73.2 kg)   SpO2 96%   BMI 25.28 kg/m   BP Readings from Last 3 Encounters:  10/06/20 122/80  07/06/20 (!) 144/80  05/26/20 132/80    Wt Readings  from Last 3 Encounters:  10/06/20 161 lb 6.4 oz (73.2 kg)  07/06/20 164 lb 12.8 oz (74.8 kg)  03/29/20 161 lb 3.2 oz (73.1 kg)    Physical Exam Constitutional:      General: He is not in acute distress.    Appearance: He is well-developed.     Comments: NAD  Eyes:     Conjunctiva/sclera: Conjunctivae normal.     Pupils: Pupils are equal, round, and reactive to light.  Neck:     Thyroid: No thyromegaly.     Vascular: No JVD.  Cardiovascular:     Rate and Rhythm: Normal rate and regular rhythm.     Heart sounds: Normal heart sounds. No murmur heard.   No friction rub. No  gallop.  Pulmonary:     Effort: Pulmonary effort is normal. No respiratory distress.     Breath sounds: Normal breath sounds. No wheezing or rales.  Chest:     Chest wall: No tenderness.  Abdominal:     General: Bowel sounds are normal. There is no distension.     Palpations: Abdomen is soft. There is no mass.     Tenderness: There is no abdominal tenderness. There is no guarding or rebound.  Musculoskeletal:        General: Tenderness present. Normal range of motion.     Cervical back: Normal range of motion.  Lymphadenopathy:     Cervical: No cervical adenopathy.  Skin:    General: Skin is warm and dry.     Findings: No rash.  Neurological:     Mental Status: He is alert and oriented to person, place, and time.     Cranial Nerves: No cranial nerve deficit.     Motor: Weakness present. No abnormal muscle tone.     Coordination: Coordination abnormal.     Gait: Gait abnormal.     Deep Tendon Reflexes: Reflexes are normal and symmetric.  Psychiatric:        Behavior: Behavior normal.        Thought Content: Thought content normal.        Judgment: Judgment normal.  Spastic gait, using poles  Lab Results  Component Value Date   WBC 5.1 03/09/2020   HGB 14.9 03/09/2020   HCT 44.9 03/09/2020   PLT 187.0 03/09/2020   GLUCOSE 93 03/09/2020   CHOL 130 07/11/2019   TRIG 49.0 07/11/2019   HDL 39.40 07/11/2019   LDLDIRECT 118.4 06/09/2009   LDLCALC 81 07/11/2019   ALT 14 03/09/2020   AST 16 03/09/2020   NA 139 03/09/2020   K 4.2 03/09/2020   CL 104 03/09/2020   CREATININE 0.78 03/09/2020   BUN 14 03/09/2020   CO2 32 03/09/2020   TSH 3.05 07/11/2019   PSA 3.78 07/11/2019   INR 1.0 08/24/2008   HGBA1C 5.8 07/11/2019    CT ABDOMEN PELVIS W CONTRAST  Result Date: 08/29/2019 CLINICAL DATA:  Diarrhea for 3 months EXAM: CT ABDOMEN AND PELVIS WITH CONTRAST TECHNIQUE: Multidetector CT imaging of the abdomen and pelvis was performed using the standard protocol following bolus  administration of intravenous contrast. CONTRAST:  182m OMNIPAQUE IOHEXOL 300 MG/ML  SOLN COMPARISON:  01/31/2018 FINDINGS: Lower chest: No acute pleural or parenchymal lung disease. Hepatobiliary: Gallbladder is surgically absent. Left lobe of the liver is atrophic, a chronic finding. Remainder of the liver is unremarkable. Pancreas: Unremarkable. No pancreatic ductal dilatation or surrounding inflammatory changes. Spleen: Normal in size without focal abnormality. Adrenals/Urinary Tract: Stable right renal cysts.  Kidneys otherwise unremarkable. Bladder is moderately distended with no focal abnormality. The adrenals are normal. Stomach/Bowel: No bowel obstruction or ileus. There is mild wall thickening of the descending colon from the splenic flexure through the rectosigmoid junction, with mild pericolonic fat stranding. Findings are consistent with inflammatory or infectious colitis. Vascular/Lymphatic: No significant vascular findings are present. No enlarged abdominal or pelvic lymph nodes. Reproductive: Mild enlargement of the prostate, stable. Other: No abdominal wall hernia or abnormality. No abdominopelvic ascites. Musculoskeletal: No acute or destructive bony lesions. Reconstructed images demonstrate no additional findings. IMPRESSION: 1. Inflammatory or infectious colitis involving the descending colon. 2. Stable mild enlargement of the prostate. Electronically Signed   By: Randa Ngo M.D.   On: 08/29/2019 18:58    Assessment & Plan:    Walker Kehr, MD

## 2020-10-06 NOTE — Assessment & Plan Note (Signed)
Chronic pain, spasticity Cont on Tramadol ER Potential benefits of a long term Tramadol/opioids use as well as potential risks (i.e. addiction risk, apnea etc) and complications (i.e. Somnolence, constipation and others) were explained to the patient and were aknowledged. Cont on Baclofen Off infusions

## 2020-10-06 NOTE — Assessment & Plan Note (Signed)
Chronic - stable Cont on Tramadol ER Potential benefits of a long term Tramadol/opioids use as well as potential risks (i.e. addiction risk, apnea etc) and complications (i.e. Somnolence, constipation and others) were explained to the patient and were aknowledged. Cont on Baclofen

## 2020-10-06 NOTE — Assessment & Plan Note (Signed)
Using  trekking poles

## 2020-11-03 ENCOUNTER — Other Ambulatory Visit: Payer: Self-pay | Admitting: Internal Medicine

## 2020-11-04 MED ORDER — AMITRIPTYLINE HCL 25 MG PO TABS
ORAL_TABLET | ORAL | 3 refills | Status: DC
Start: 2020-11-04 — End: 2021-04-06

## 2020-11-11 MED ORDER — TADALAFIL 5 MG PO TABS: 5.0000 mg | ORAL_TABLET | Freq: Every day | ORAL | 3 refills | Status: DC

## 2020-12-01 ENCOUNTER — Other Ambulatory Visit: Payer: Self-pay | Admitting: Internal Medicine

## 2020-12-01 MED ORDER — VANCOMYCIN HCL 125 MG PO CAPS: ORAL_CAPSULE | ORAL | 0 refills | Status: AC

## 2020-12-13 ENCOUNTER — Ambulatory Visit: Payer: Medicare Other | Admitting: Internal Medicine

## 2020-12-15 DIAGNOSIS — H2513 Age-related nuclear cataract, bilateral: Secondary | ICD-10-CM | POA: Diagnosis not present

## 2020-12-15 DIAGNOSIS — H527 Unspecified disorder of refraction: Secondary | ICD-10-CM | POA: Diagnosis not present

## 2020-12-15 DIAGNOSIS — H0102A Squamous blepharitis right eye, upper and lower eyelids: Secondary | ICD-10-CM | POA: Diagnosis not present

## 2020-12-15 DIAGNOSIS — H0288A Meibomian gland dysfunction right eye, upper and lower eyelids: Secondary | ICD-10-CM | POA: Diagnosis not present

## 2020-12-15 DIAGNOSIS — H4720 Unspecified optic atrophy: Secondary | ICD-10-CM | POA: Diagnosis not present

## 2020-12-15 DIAGNOSIS — H0288B Meibomian gland dysfunction left eye, upper and lower eyelids: Secondary | ICD-10-CM | POA: Diagnosis not present

## 2020-12-15 DIAGNOSIS — H0102B Squamous blepharitis left eye, upper and lower eyelids: Secondary | ICD-10-CM | POA: Diagnosis not present

## 2020-12-15 DIAGNOSIS — H04123 Dry eye syndrome of bilateral lacrimal glands: Secondary | ICD-10-CM | POA: Diagnosis not present

## 2021-01-17 ENCOUNTER — Encounter: Payer: Self-pay | Admitting: Internal Medicine

## 2021-01-19 ENCOUNTER — Other Ambulatory Visit: Payer: Self-pay | Admitting: Internal Medicine

## 2021-01-19 MED ORDER — BACLOFEN 20 MG PO TABS: ORAL_TABLET | ORAL | 3 refills | Status: DC

## 2021-03-09 DIAGNOSIS — D1801 Hemangioma of skin and subcutaneous tissue: Secondary | ICD-10-CM | POA: Diagnosis not present

## 2021-03-09 DIAGNOSIS — L821 Other seborrheic keratosis: Secondary | ICD-10-CM | POA: Diagnosis not present

## 2021-03-09 DIAGNOSIS — Z85828 Personal history of other malignant neoplasm of skin: Secondary | ICD-10-CM | POA: Diagnosis not present

## 2021-03-09 DIAGNOSIS — D0462 Carcinoma in situ of skin of left upper limb, including shoulder: Secondary | ICD-10-CM | POA: Diagnosis not present

## 2021-04-01 ENCOUNTER — Other Ambulatory Visit: Payer: Self-pay

## 2021-04-01 ENCOUNTER — Other Ambulatory Visit (INDEPENDENT_AMBULATORY_CARE_PROVIDER_SITE_OTHER): Payer: Medicare Other

## 2021-04-01 DIAGNOSIS — E785 Hyperlipidemia, unspecified: Secondary | ICD-10-CM

## 2021-04-01 DIAGNOSIS — Z0000 Encounter for general adult medical examination without abnormal findings: Secondary | ICD-10-CM | POA: Diagnosis not present

## 2021-04-01 DIAGNOSIS — G35 Multiple sclerosis: Secondary | ICD-10-CM | POA: Diagnosis not present

## 2021-04-01 DIAGNOSIS — Z125 Encounter for screening for malignant neoplasm of prostate: Secondary | ICD-10-CM | POA: Diagnosis not present

## 2021-04-01 DIAGNOSIS — N320 Bladder-neck obstruction: Secondary | ICD-10-CM

## 2021-04-01 LAB — URINALYSIS
Bilirubin Urine: NEGATIVE
Hgb urine dipstick: NEGATIVE
Ketones, ur: NEGATIVE
Leukocytes,Ua: NEGATIVE
Nitrite: NEGATIVE
Specific Gravity, Urine: 1.015 (ref 1.000–1.030)
Total Protein, Urine: NEGATIVE
Urine Glucose: NEGATIVE
Urobilinogen, UA: 0.2 (ref 0.0–1.0)
pH: 8 (ref 5.0–8.0)

## 2021-04-01 LAB — COMPREHENSIVE METABOLIC PANEL
ALT: 14 U/L (ref 0–53)
AST: 15 U/L (ref 0–37)
Albumin: 4.2 g/dL (ref 3.5–5.2)
Alkaline Phosphatase: 65 U/L (ref 39–117)
BUN: 15 mg/dL (ref 6–23)
CO2: 35 mEq/L — ABNORMAL HIGH (ref 19–32)
Calcium: 9.1 mg/dL (ref 8.4–10.5)
Chloride: 104 mEq/L (ref 96–112)
Creatinine, Ser: 0.85 mg/dL (ref 0.40–1.50)
GFR: 90.77 mL/min (ref >60.00)
Glucose, Bld: 99 mg/dL (ref 70–99)
Potassium: 4.5 mEq/L (ref 3.5–5.1)
Sodium: 140 mEq/L (ref 135–145)
Total Bilirubin: 0.5 mg/dL (ref 0.2–1.2)
Total Protein: 6.5 g/dL (ref 6.0–8.3)

## 2021-04-01 LAB — CBC WITH DIFFERENTIAL/PLATELET
Basophils Absolute: 0 10*3/uL (ref 0.0–0.1)
Basophils Relative: 0.9 % (ref 0.0–3.0)
Eosinophils Absolute: 0.1 10*3/uL (ref 0.0–0.7)
Eosinophils Relative: 2.1 % (ref 0.0–5.0)
HCT: 44.8 % (ref 39.0–52.0)
Hemoglobin: 14.8 g/dL (ref 13.0–17.0)
Lymphocytes Relative: 30.7 % (ref 12.0–46.0)
Lymphs Abs: 1.7 10*3/uL (ref 0.7–4.0)
MCHC: 33.1 g/dL (ref 30.0–36.0)
MCV: 93.3 fl (ref 78.0–100.0)
Monocytes Absolute: 0.4 10*3/uL (ref 0.1–1.0)
Monocytes Relative: 7.9 % (ref 3.0–12.0)
Neutro Abs: 3.2 10*3/uL (ref 1.4–7.7)
Neutrophils Relative %: 58.4 % (ref 43.0–77.0)
Platelets: 171 10*3/uL (ref 150.0–400.0)
RBC: 4.81 Mil/uL (ref 4.22–5.81)
RDW: 12.8 % (ref 11.5–15.5)
WBC: 5.5 10*3/uL (ref 4.0–10.5)

## 2021-04-01 LAB — LIPID PANEL
Cholesterol: 191 mg/dL (ref 0–200)
HDL: 49.8 mg/dL (ref >39.00)
LDL Cholesterol: 120 mg/dL — ABNORMAL HIGH (ref 0–99)
NonHDL: 141.43
Total CHOL/HDL Ratio: 4
Triglycerides: 107 mg/dL (ref 0.0–149.0)
VLDL: 21.4 mg/dL (ref 0.0–40.0)

## 2021-04-01 LAB — PSA: PSA: 2.74 ng/mL (ref 0.10–4.00)

## 2021-04-01 LAB — TSH: TSH: 4.8 u[IU]/mL (ref 0.35–5.50)

## 2021-04-06 ENCOUNTER — Other Ambulatory Visit: Payer: Self-pay

## 2021-04-06 ENCOUNTER — Ambulatory Visit (INDEPENDENT_AMBULATORY_CARE_PROVIDER_SITE_OTHER): Payer: Medicare Other

## 2021-04-06 ENCOUNTER — Ambulatory Visit (INDEPENDENT_AMBULATORY_CARE_PROVIDER_SITE_OTHER): Payer: Medicare Other | Admitting: Internal Medicine

## 2021-04-06 ENCOUNTER — Encounter: Payer: Self-pay | Admitting: Internal Medicine

## 2021-04-06 VITALS — BP 144/82 | HR 75 | Temp 99.0°F | Ht 67.0 in | Wt 163.0 lb

## 2021-04-06 DIAGNOSIS — G8929 Other chronic pain: Secondary | ICD-10-CM

## 2021-04-06 DIAGNOSIS — Z0000 Encounter for general adult medical examination without abnormal findings: Secondary | ICD-10-CM

## 2021-04-06 DIAGNOSIS — R269 Unspecified abnormalities of gait and mobility: Secondary | ICD-10-CM

## 2021-04-06 DIAGNOSIS — G35 Multiple sclerosis: Secondary | ICD-10-CM | POA: Diagnosis not present

## 2021-04-06 DIAGNOSIS — Z8619 Personal history of other infectious and parasitic diseases: Secondary | ICD-10-CM

## 2021-04-06 DIAGNOSIS — M5440 Lumbago with sciatica, unspecified side: Secondary | ICD-10-CM

## 2021-04-06 DIAGNOSIS — E559 Vitamin D deficiency, unspecified: Secondary | ICD-10-CM

## 2021-04-06 MED ORDER — TRAMADOL HCL (ER BIPHASIC) 200 MG PO CP24: 1.0000 | ORAL_CAPSULE | Freq: Every day | ORAL | 1 refills | Status: DC

## 2021-04-06 NOTE — Assessment & Plan Note (Signed)
On Vit D 

## 2021-04-06 NOTE — Assessment & Plan Note (Addendum)
Baclofen, Tramadol prn ?Read about Naltrexone 5 mg a day and Biotin 300 mg a day for MS - pt is not interested ?

## 2021-04-06 NOTE — Progress Notes (Addendum)
Subjective:   [name] is a 66 y.o. male who presents for Medicare Annual/Subsequent preventive examination.  Review of Systems     Cardiac Risk Factors include: advanced age (>6men, >20 women);male gender     Objective:    Today's Vitals   04/06/21 1355  BP: (!) 144/82  Pulse: 75  Temp: 99 F (37.2 C)  SpO2: 98%  Weight: 163 lb (73.9 kg)  Height: 5\' 7"  (1.702 m)  PainSc: 0-No pain   Body mass index is 25.53 kg/m.  Advanced Directives 04/06/2021 03/29/2020 08/29/2019 08/29/2019 01/30/2018 03/29/2016 03/20/2016  Does Patient Have a Medical Advance Directive? Yes Yes Yes No Yes Yes No  Type of Advance Directive Living will;Healthcare Power of [place];Living will - Out of facility DNR (pink MOST or yellow form) [place];Living will -  Does patient want to make changes to medical advance directive? No - Patient declined No - Patient declined No - Patient declined - - - -  Copy of [place] in Chart? No - copy requested - No - copy requested - - No - copy requested -  Would patient like information on creating a medical advance directive? - - No - Patient declined No - Patient declined - - No - Patient declined    Current Medications (verified) Outpatient Encounter Medications as of 04/06/2021  Medication Sig   amitriptyline (ELAVIL) 25 MG tablet TAKE 2 TABLETS AT BEDTIME AS NEEDED FOR SLEEP   baclofen (LIORESAL) 20 MG tablet TAKE 1 TABLET FOUR TIMES A DAY AS NEEDED FOR MUSCLE SPASMS (OFFICE VISIT NEEDED BEFORE REFILLS WILL BE GIVEN)   cetirizine (ZYRTEC) 10 MG tablet Take 10 mg by mouth daily as needed for allergies.   cholecalciferol (VITAMIN D) 1000 units tablet Take 1,000 Units by mouth daily.   clotrimazole-betamethasone (LOTRISONE) cream APPLY 1 APPLICATION TOPICALLY TWICE A DAY AS NEEDED FOR IRRITATION   fluticasone (FLONASE) 50 MCG/ACT nasal spray Place 2 sprays into both nostrils daily as needed for  allergies or rhinitis.   furosemide (LASIX) 20 MG tablet Take 1 tablet (20 mg total) by mouth daily.   gabapentin (NEURONTIN) 100 MG capsule TAKE 2 CAPSULES THREE TIMES A DAY AS NEEDED FOR PAIN   Multiple Vitamin (MULTIVITAMIN) capsule Take 1 tablet by mouth daily.   pantoprazole (PROTONIX) 40 MG tablet Take 1 tablet (40 mg total) by mouth daily.   Probiotic Product (ALIGN) 4 MG CAPS Take 1 capsule (4 mg total) by mouth daily.   tadalafil (CIALIS) 5 MG tablet Take 1 tablet (5 mg total) by mouth daily.   TraMADol HCl 200 MG CP24 Take 1 capsule by mouth daily.   No facility-administered encounter medications on file as of 04/06/2021.    Allergies (verified) Meperidine, Ocrevus [ocrelizumab], Penicillin g, and Prednisone   History: Past Medical History:  Diagnosis Date   Arthritis    Asthma    C. difficile diarrhea    Gallstone pancreatitis    Hernia    History of inguinal hernia repair    Low back pain    MS (multiple sclerosis) ([place])    Pancreatitis hosp[italized with gallstone pancreatitis 3/12[   Vitamin D deficiency    Past Surgical History:  Procedure Laterality Date   BIOPSY  09/02/2019   Procedure: BIOPSY;  Surgeon: Ladene Artist, MD;  Location: [facility];  Service: Endoscopy;;   CHOLECYSTECTOMY  04/2010   COLONOSCOPY WITH PROPOFOL N/A 09/02/2019   Procedure: COLONOSCOPY WITH PROPOFOL;  Surgeon: Ladene Artist, MD;  Location: [facility];  Service: Endoscopy;  Laterality: N/A;   CYSTECTOMY     on back and R hand   CYSTECTOMY     left arm   HERNIA REPAIR     x 2   INGUINAL HERNIA REPAIR  03/17/2011   Procedure: HERNIA REPAIR INGUINAL ADULT;  Surgeon: Adin Hector, MD;  Location: [facility];  Service: General;  Laterality: Left;  Open left inguinal hernia repair with mesh   KNEE ARTHROSCOPY     right   NOSE SURGERY     POLYPECTOMY  09/02/2019   Procedure: POLYPECTOMY;  Surgeon: Ladene Artist, MD;  Location: [facility];  Service: Endoscopy;;    TONSILLECTOMY     WISDOM TOOTH EXTRACTION     Family History  Problem Relation Age of Onset   Lung cancer Father        not related to smoking   Diabetes Mother    Colon cancer Neg Hx    Esophageal cancer Neg Hx    Pancreatic cancer Neg Hx    Stomach cancer Neg Hx    Liver disease Neg Hx    Social History   Socioeconomic History   Marital status: Married    Spouse name: Not on file   Number of children: 2   Years of education: Not on file   Highest education level: Not on file  Occupational History   Occupation: Health visitor: [employer]  Tobacco Use   Smoking status: Never   Smokeless tobacco: Never  Vaping Use   Vaping Use: Never used  Substance and Sexual Activity   Alcohol use: No   Drug use: No   Sexual activity: Yes  Other Topics Concern   Not on file  Social History Narrative   Not on file   Social Determinants of Health   Financial Resource Strain: Low Risk    Difficulty of Paying Living Expenses: Not hard at all  Food Insecurity: No Food Insecurity   Worried About Charity fundraiser in the Last Year: Never true   Ran Out of Food in the Last Year: Never true  Transportation Needs: No Transportation Needs   Lack of Transportation (Medical): No   Lack of Transportation (Non-Medical): No  Physical Activity: Sufficiently Active   Days of Exercise per Week: 5 days   Minutes of Exercise per Session: 30 min  Stress: No Stress Concern Present   Feeling of Stress : Not at all  Social Connections: Moderately Isolated   Frequency of Communication with Friends and Family: More than three times a week   Frequency of Social Gatherings with Friends and Family: Once a week   Attends Religious Services: Never   Marine scientist or Organizations: No   Attends Music therapist: Never   Marital Status: Married    Tobacco Counseling Counseling given: Not Answered   Clinical Intake:  Pre-visit preparation completed: Yes  Pain :  No/denies pain Pain Score: 0-No pain     BMI - recorded: 25.53 Nutritional Status: BMI 25 -29 Overweight Nutritional Risks: None Diabetes: No  How often do you need to have someone help you when you read instructions, pamphlets, or other written materials from your doctor or pharmacy?: 1 - Never 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 np  Interpreter Needed?: No  Information entered by :: [employer], LPN  Activities of Daily Living In your present state of health, do you have any difficulty performing the following activities: 04/06/2021  Hearing? N  Vision? N  Difficulty concentrating or making decisions? N  Walking or climbing stairs? Y  Dressing or bathing? N  Doing errands, shopping? N  Preparing Food and eating ? N  Using the Toilet? N  In the past six months, have you accidently leaked urine? N  Do you have problems with loss of bowel control? N  Managing your Medications? N  Managing your Finances? N  Housekeeping or managing your Housekeeping? N  Some recent data might be hidden    Patient Care Team: Plotnikov, Evie Lacks, MD as PCP - Toni Arthurs, MD as Referring Physician (Neurology) Milus Banister, MD as Attending Physician (Gastroenterology) Clent Jacks, MD as Consulting Physician (Ophthalmology)  Indicate any recent Medical Services you may have received from other than [facility] providers in the past year (date may be approximate).     Assessment:   This is a routine wellness examination for [place].  Hearing/Vision screen Hearing Screening - Comments:: Patient denied any hearing difficulty.   No hearing aids.  Vision Screening - Comments:: Patient wears corrective glasses/contacts.  Eye exam done annually by: Dr. Clent Jacks  Dietary issues and exercise activities discussed: Current Exercise Habits: Home exercise routine, Type of exercise: Other - see comments (squats  everyday), Time (Minutes): 30, Frequency (Times/Week): 5, Weekly Exercise (Minutes/Week): 150, Intensity: Moderate, Exercise limited by: respiratory conditions(s);Other - see comments (Multiple Sclerosis)   Goals Addressed               This Visit's Progress     Patient Stated (pt-stated)        Continue to maintain and stay physically active       Depression Screen PHQ 2/9 Scores 04/06/2021 03/29/2020 12/08/2019 06/05/2019 05/16/2017 03/29/2016 03/29/2016  PHQ - 2 Score 0 0 0 0 0 0 0    Fall Risk Fall Risk  04/06/2021 03/29/2020 12/08/2019 03/29/2016 03/29/2016  Falls in the past year? 0 1 1 Yes Yes  Number falls in past yr: 0 0 0 1 1  Injury with Fall? 0 1 0 No No  Risk for fall due to : - Impaired mobility;Impaired balance/gait History of fall(s);Impaired mobility - -  Follow up - - Falls evaluation completed - -    FALL RISK PREVENTION PERTAINING TO THE HOME:  Any stairs in or around the home? No  If so, are there any without handrails? No  Home free of loose throw rugs in walkways, pet beds, electrical cords, etc? Yes  Adequate lighting in your home to reduce risk of falls? Yes   ASSISTIVE DEVICES UTILIZED TO PREVENT FALLS:  Life alert? No  Use of a cane, walker or w/c? Yes  Grab bars in the bathroom? Yes  Shower chair or bench in shower? Yes  Elevated toilet seat or a handicapped toilet? Yes   TIMED UP AND GO:  Was the test performed? Yes .  Length of time to ambulate 10 feet: 12 sec.   Gait slow and steady with assistive device  Cognitive Function: Normal cognitive status assessed by direct observation by this Nurse Health Advisor. No abnormalities found.          Immunizations Immunization History  Administered Date(s) Administered   Influenza Split 01/24/2011   Influenza Whole 11/07/2011   Influenza,inj,Quad PF,6+ Mos 11/15/2016, 11/14/2017, 11/16/2018, 12/08/2019   PFIZER(Purple Top)SARS-COV-2 Vaccination 04/24/2019, 05/20/2019, 09/29/2019  Pneumococcal  Conjugate-13 03/27/2014   Pneumococcal Polysaccharide-23 06/14/2009, 07/06/2020   Tdap 09/04/2012   Zoster, Live 03/29/2015    TDAP status: Up to date  Flu Vaccine status: Due, Education has been provided regarding the importance of this vaccine. Advised may receive this vaccine at local pharmacy or Health Dept. Aware to provide a copy of the vaccination record if obtained from local pharmacy or Health Dept. Verbalized acceptance and understanding.  Pneumococcal vaccine status: Up to date  Covid-19 vaccine status: Completed vaccines  Qualifies for Shingles Vaccine? Yes   Zostavax completed Yes   Shingrix Completed?: No.    Education has been provided regarding the importance of this vaccine. Patient has been advised to call insurance company to determine out of pocket expense if they have not yet received this vaccine. Advised may also receive vaccine at local pharmacy or Health Dept. Verbalized acceptance and understanding.  Screening Tests Health Maintenance  Topic Date Due   Zoster Vaccines- Shingrix (1 of 2) Never done   COLONOSCOPY (Pts 45-98yrs Insurance coverage will need to be confirmed)  09/01/2020   COVID-19 Vaccine (4 - Booster for Pfizer series) 04/22/2021 (Originally 11/24/2019)   INFLUENZA VACCINE  05/06/2021 (Originally 09/06/2020)   TETANUS/TDAP  09/05/2022   Pneumonia Vaccine 78+ Years old  Completed   Hepatitis C Screening  Completed   HPV VACCINES  Aged Out   Fecal DNA (Cologuard)  Discontinued    Health Maintenance  Health Maintenance Due  Topic Date Due   Zoster Vaccines- Shingrix (1 of 2) Never done   COLONOSCOPY (Pts 45-79yrs Insurance coverage will need to be confirmed)  09/01/2020    Colorectal cancer screening: Type of screening: Colonoscopy. Completed 09/02/2019. Repeat every 1 years  Lung Cancer Screening: (Low Dose CT Chest recommended if Age 19-80 years, 30 pack-year currently smoking OR have quit w/in 15years.) does not qualify.   Lung Cancer  Screening Referral: no  Additional Screening:  Hepatitis C Screening: does qualify; Completed yes  Vision Screening: Recommended annual ophthalmology exams for early detection of glaucoma and other disorders of the eye. Is the patient up to date with their annual eye exam?  Yes  Who is the provider or what is the name of the office in which the patient attends annual eye exams? Clent Jacks, MD. If pt is not established with a provider, would they like to be referred to a provider to establish care? No .   Dental Screening: Recommended annual dental exams for proper oral hygiene  Community Resource Referral / Chronic Care Management: CRR required this visit?  No   CCM required this visit?  No      Plan:     I have personally reviewed and noted the following in the patients chart:   Medical and social history Use of alcohol, tobacco or illicit drugs  Current medications and supplements including opioid prescriptions. Patient is not currently taking opioid prescriptions. Functional ability and status Nutritional status Physical activity Advanced directives List of other physicians Hospitalizations, surgeries, and ER visits in previous 12 months Vitals Screenings to include cognitive, depression, and falls Referrals and appointments  In addition, I have reviewed and discussed with patient certain preventive protocols, quality metrics, and best practice recommendations. A written personalized care plan for preventive services as well as general preventive health recommendations were provided to patient.     Sheral Flow, LPN   0/10/3816   Nurse Notes:  Hearing Screening - Comments:: Patient denied any hearing difficulty.   No  hearing aids.  Vision Screening - Comments:: Patient wears corrective glasses/contacts.  Eye exam done annually by: Dr. Clent Jacks   Medical screening examination/treatment/procedure(s) were performed by non-physician practitioner and as  supervising physician I was immediately available for consultation/collaboration.  I agree with above. Lew Dawes, MD

## 2021-04-06 NOTE — Assessment & Plan Note (Signed)
Using trekking poles ?

## 2021-04-06 NOTE — Assessment & Plan Note (Signed)
Cont on Tramadol ER ?Potential benefits of a long term Tramadol/opioids use as well as potential risks (i.e. addiction risk, apnea etc) and complications (i.e. Somnolence, constipation and others) were explained to the patient and were aknowledged. ?Cont on Baclofen ?

## 2021-04-06 NOTE — Patient Instructions (Signed)
[name] , Thank you for taking time to come for your Medicare Wellness Visit. I appreciate your ongoing commitment to your health goals. Please review the following plan we discussed and let me know if I can assist you in the future.   Screening recommendations/referrals: Colonoscopy: 09/02/2019; due every year Recommended yearly ophthalmology/optometry visit for glaucoma screening and checkup Recommended yearly dental visit for hygiene and checkup  Vaccinations: Influenza vaccine: 12/08/2019; due every fall season Pneumococcal vaccine: 03/27/2014, 07/06/2020 Tdap vaccine: 09/04/2012; due every 4 years Shingles vaccine: never done   Covid-19: 04/24/2019, 05/20/2019, 09/29/2019  Advanced directives: Please bring a copy of your health care power of attorney and living will to the office at your convenience.  Conditions/risks identified: Yes; Client understands the importance of follow-up appointments with providers by attending scheduled visits and discussed goals to eat healthier, increase physical activity 5 times a week for 30 minutes each, exercise the brain by doing stimulating brain exercises (reading, adult coloring, crafting, listening to music, puzzles, etc.), socialize and enjoy life more, get enough sleep at least 8-9 hours average per night and make time for laughter.  Next appointment: Please schedule your next Medicare Wellness Visit with your Nurse Health Advisor in 1 year by calling [phone].  Preventive Care 48 Years and Older, Male Preventive care refers to lifestyle choices and visits with your health care provider that can promote health and wellness. What does preventive care include? A yearly physical exam. This is also called an annual well check. Dental exams once or twice a year. Routine eye exams. Ask your health care provider how often you should have your eyes checked. Personal lifestyle choices, including: Daily care of your teeth and gums. Regular physical  activity. Eating a healthy diet. Avoiding tobacco and drug use. Limiting alcohol use. Practicing safe sex. Taking low doses of aspirin every day. Taking vitamin and mineral supplements as recommended by your health care provider. What happens during an annual well check? The services and screenings done by your health care provider during your annual well check will depend on your age, overall health, lifestyle risk factors, and family history of disease. Counseling  Your health care provider may ask you questions about your: Alcohol use. Tobacco use. Drug use. Emotional well-being. Home and relationship well-being. Sexual activity. Eating habits. History of falls. Memory and ability to understand (cognition). Work and work Statistician. Screening  You may have the following tests or measurements: Height, weight, and BMI. Blood pressure. Lipid and cholesterol levels. These may be checked every 5 years, or more frequently if you are over 65 years old. Skin check. Lung cancer screening. You may have this screening every year starting at age 73 if you have a 30-pack-year history of smoking and currently smoke or have quit within the past 15 years. Fecal occult blood test (FOBT) of the stool. You may have this test every year starting at age 32. Flexible sigmoidoscopy or colonoscopy. You may have a sigmoidoscopy every 5 years or a colonoscopy every 10 years starting at age 5. Prostate cancer screening. Recommendations will vary depending on your family history and other risks. Hepatitis C blood test. Hepatitis B blood test. Sexually transmitted disease (STD) testing. Diabetes screening. This is done by checking your blood sugar (glucose) after you have not eaten for a while (fasting). You may have this done every 1-3 years. Abdominal aortic aneurysm (AAA) screening. You may need this if you are a current or former smoker. Osteoporosis. You may be screened starting at  age 50 if you are  at high risk. Talk with your health care provider about your test results, treatment options, and if necessary, the need for more tests. Vaccines  Your health care provider may recommend certain vaccines, such as: Influenza vaccine. This is recommended every year. Tetanus, diphtheria, and acellular pertussis (Tdap, Td) vaccine. You may need a Td booster every 10 years. Zoster vaccine. You may need this after age 47. Pneumococcal 13-valent conjugate (PCV13) vaccine. One dose is recommended after age 25. Pneumococcal polysaccharide (PPSV23) vaccine. One dose is recommended after age 10. Talk to your health care provider about which screenings and vaccines you need and how often you need them. This information is not intended to replace advice given to you by your health care provider. Make sure you discuss any questions you have with your health care provider. Document Released: 02/19/2015 Document Revised: 10/13/2015 Document Reviewed: 11/24/2014 Elsevier Interactive Patient Education  2017 [place] Prevention in the Home Falls can cause injuries. They can happen to people of all ages. There are many things you can do to make your home safe and to help prevent falls. What can I do on the outside of my home? Regularly fix the edges of walkways and driveways and fix any cracks. Remove anything that might make you trip as you walk through a door, such as a raised step or threshold. Trim any bushes or trees on the path to your home. Use bright outdoor lighting. Clear any walking paths of anything that might make someone trip, such as rocks or tools. Regularly check to see if handrails are loose or broken. Make sure that both sides of any steps have handrails. Any raised decks and porches should have guardrails on the edges. Have any leaves, snow, or ice cleared regularly. Use sand or salt on walking paths during winter. Clean up any spills in your garage right away. This includes oil  or grease spills. What can I do in the bathroom? Use night lights. Install grab bars by the toilet and in the tub and shower. Do not use towel bars as grab bars. Use non-skid mats or decals in the tub or shower. If you need to sit down in the shower, use a plastic, non-slip stool. Keep the floor dry. Clean up any water that spills on the floor as soon as it happens. Remove soap buildup in the tub or shower regularly. Attach bath mats securely with double-sided non-slip rug tape. Do not have throw rugs and other things on the floor that can make you trip. What can I do in the bedroom? Use night lights. Make sure that you have a light by your bed that is easy to reach. Do not use any sheets or blankets that are too big for your bed. They should not hang down onto the floor. Have a firm chair that has side arms. You can use this for support while you get dressed. Do not have throw rugs and other things on the floor that can make you trip. What can I do in the kitchen? Clean up any spills right away. Avoid walking on wet floors. Keep items that you use a lot in easy-to-reach places. If you need to reach something above you, use a strong step stool that has a grab bar. Keep electrical cords out of the way. Do not use floor polish or wax that makes floors slippery. If you must use wax, use non-skid floor wax. Do not have throw rugs and  other things on the floor that can make you trip. What can I do with my stairs? Do not leave any items on the stairs. Make sure that there are handrails on both sides of the stairs and use them. Fix handrails that are broken or loose. Make sure that handrails are as long as the stairways. Check any carpeting to make sure that it is firmly attached to the stairs. Fix any carpet that is loose or worn. Avoid having throw rugs at the top or bottom of the stairs. If you do have throw rugs, attach them to the floor with carpet tape. Make sure that you have a light  switch at the top of the stairs and the bottom of the stairs. If you do not have them, ask someone to add them for you. What else can I do to help prevent falls? Wear shoes that: Do not have high heels. Have rubber bottoms. Are comfortable and fit you well. Are closed at the toe. Do not wear sandals. If you use a stepladder: Make sure that it is fully opened. Do not climb a closed stepladder. Make sure that both sides of the stepladder are locked into place. Ask someone to hold it for you, if possible. Clearly mark and make sure that you can see: Any grab bars or handrails. First and last steps. Where the edge of each step is. Use tools that help you move around (mobility aids) if they are needed. These include: Canes. Walkers. Scooters. Crutches. Turn on the lights when you go into a dark area. Replace any light bulbs as soon as they burn out. Set up your furniture so you have a clear path. Avoid moving your furniture around. If any of your floors are uneven, fix them. If there are any pets around you, be aware of where they are. Review your medicines with your doctor. Some medicines can make you feel dizzy. This can increase your chance of falling. Ask your doctor what other things that you can do to help prevent falls. This information is not intended to replace advice given to you by your health care provider. Make sure you discuss any questions you have with your health care provider. Document Released: 11/19/2008 Document Revised: 07/01/2015 Document Reviewed: 02/27/2014 Elsevier Interactive Patient Education  2017 [employer].

## 2021-04-06 NOTE — Assessment & Plan Note (Signed)
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

## 2021-04-06 NOTE — Progress Notes (Signed)
Subjective:  Patient ID: [name], male    DOB: 1955-12-01  Age: 66 y.o. MRN: [id number]  CC: Follow-up (Would like to know if there is anything to do to help tendonitis. //Pt would also like Tramadol prescription sent to local pharmacy [facility].)   HPI [name] presents for MS, chronic pain, gait disorder, remote C diff Tramadol was n/a at [facility]  Outpatient Medications Prior to Visit  Medication Sig Dispense Refill   amitriptyline (ELAVIL) 25 MG tablet TAKE 2 TABLETS AT BEDTIME AS NEEDED FOR SLEEP 180 tablet 3   baclofen (LIORESAL) 20 MG tablet TAKE 1 TABLET FOUR TIMES A DAY AS NEEDED FOR MUSCLE SPASMS (OFFICE VISIT NEEDED BEFORE REFILLS WILL BE GIVEN) 360 tablet 3   cetirizine (ZYRTEC) 10 MG tablet Take 10 mg by mouth daily as needed for allergies.     cholecalciferol (VITAMIN D) 1000 units tablet Take 1,000 Units by mouth daily.     clotrimazole-betamethasone (LOTRISONE) cream APPLY 1 APPLICATION TOPICALLY TWICE A DAY AS NEEDED FOR IRRITATION 30 g 2   fluticasone (FLONASE) 50 MCG/ACT nasal spray Place 2 sprays into both nostrils daily as needed for allergies or rhinitis.     furosemide (LASIX) 20 MG tablet Take 1 tablet (20 mg total) by mouth daily. 30 tablet 3   gabapentin (NEURONTIN) 100 MG capsule TAKE 2 CAPSULES THREE TIMES A DAY AS NEEDED FOR PAIN 360 capsule 5   Multiple Vitamin (MULTIVITAMIN) capsule Take 1 tablet by mouth daily.     pantoprazole (PROTONIX) 40 MG tablet Take 1 tablet (40 mg total) by mouth daily. 90 tablet 3   Probiotic Product (ALIGN) 4 MG CAPS Take 1 capsule (4 mg total) by mouth daily. 90 capsule 3   tadalafil (CIALIS) 5 MG tablet Take 1 tablet (5 mg total) by mouth daily. 90 tablet 3   TraMADol HCl 200 MG CP24 Take 1 capsule by mouth daily. 90 capsule 1   amitriptyline (ELAVIL) 25 MG tablet TAKE 2 TABLETS AT BEDTIME AS NEEDED FOR SLEEP 180 tablet 3   No facility-administered medications prior to visit.    ROS: Review of Systems   Constitutional:  Negative for appetite change, fatigue and unexpected weight change.  HENT:  Positive for hearing loss and tinnitus. Negative for congestion, nosebleeds, sneezing, sore throat and trouble swallowing.   Eyes:  Negative for itching and visual disturbance.  Respiratory:  Negative for cough.   Cardiovascular:  Negative for chest pain, palpitations and leg swelling.  Gastrointestinal:  Negative for abdominal distention, blood in stool, diarrhea and nausea.  Genitourinary:  Negative for frequency and hematuria.  Musculoskeletal:  Positive for arthralgias and gait problem. Negative for back pain, joint swelling and neck pain.  Skin:  Negative for rash.  Neurological:  Negative for dizziness, tremors, speech difficulty and weakness.  Psychiatric/Behavioral:  Negative for agitation, dysphoric mood, sleep disturbance and suicidal ideas. The patient is nervous/anxious.    Objective:  BP (!) 144/82 (BP Location: Left Arm, Patient Position: Sitting, Cuff Size: Large)    Pulse 75    Temp 99 F (37.2 C) (Oral)    Ht 5\' 7"  (1.702 m)    Wt 163 lb (73.9 kg)    SpO2 98%    BMI 25.53 kg/m   BP Readings from Last 3 Encounters:  04/06/21 (!) 144/82  04/06/21 (!) 144/82  10/06/20 122/80    Wt Readings from Last 3 Encounters:  04/06/21 163 lb (73.9 kg)  04/06/21 163 lb (73.9 kg)  10/06/20 161  lb 6.4 oz (73.2 kg)    Physical Exam Constitutional:      General: He is not in acute distress.    Appearance: He is well-developed.     Comments: NAD  Eyes:     Conjunctiva/sclera: Conjunctivae normal.     Pupils: Pupils are equal, round, and reactive to light.  Neck:     Thyroid: No thyromegaly.     Vascular: No JVD.  Cardiovascular:     Rate and Rhythm: Normal rate and regular rhythm.     Heart sounds: Normal heart sounds. No murmur heard.   No friction rub. No gallop.  Pulmonary:     Effort: Pulmonary effort is normal. No respiratory distress.     Breath sounds: Normal breath sounds.  No wheezing or rales.  Chest:     Chest wall: No tenderness.  Abdominal:     General: Bowel sounds are normal. There is no distension.     Palpations: Abdomen is soft. There is no mass.     Tenderness: There is no abdominal tenderness. There is no guarding or rebound.  Musculoskeletal:        General: No tenderness. Normal range of motion.     Cervical back: Normal range of motion.  Lymphadenopathy:     Cervical: No cervical adenopathy.  Skin:    General: Skin is warm and dry.     Findings: No rash.  Neurological:     Mental Status: He is alert and oriented to person, place, and time.     Cranial Nerves: No cranial nerve deficit.     Motor: No abnormal muscle tone.     Coordination: Coordination abnormal.     Gait: Gait abnormal.     Deep Tendon Reflexes: Reflexes are normal and symmetric.  Psychiatric:        Behavior: Behavior normal.        Thought Content: Thought content normal.        Judgment: Judgment normal.    Lab Results  Component Value Date   WBC 5.5 04/01/2021   HGB 14.8 04/01/2021   HCT 44.8 04/01/2021   PLT 171.0 04/01/2021   GLUCOSE 99 04/01/2021   CHOL 191 04/01/2021   TRIG 107.0 04/01/2021   HDL 49.80 04/01/2021   LDLDIRECT 118.4 06/09/2009   LDLCALC 120 (H) 04/01/2021   ALT 14 04/01/2021   AST 15 04/01/2021   NA 140 04/01/2021   K 4.5 04/01/2021   CL 104 04/01/2021   CREATININE 0.85 04/01/2021   BUN 15 04/01/2021   CO2 35 (H) 04/01/2021   TSH 4.80 04/01/2021   PSA 2.74 04/01/2021   INR 1.0 08/24/2008   HGBA1C 5.8 07/11/2019    CT ABDOMEN PELVIS W CONTRAST  Result Date: 08/29/2019 CLINICAL DATA:  Diarrhea for 3 months EXAM: CT ABDOMEN AND PELVIS WITH CONTRAST TECHNIQUE: Multidetector CT imaging of the abdomen and pelvis was performed using the standard protocol following bolus administration of intravenous contrast. CONTRAST:  161mL OMNIPAQUE IOHEXOL 300 MG/ML  SOLN COMPARISON:  01/31/2018 FINDINGS: Lower chest: No acute pleural or  parenchymal lung disease. Hepatobiliary: Gallbladder is surgically absent. Left lobe of the liver is atrophic, a chronic finding. Remainder of the liver is unremarkable. Pancreas: Unremarkable. No pancreatic ductal dilatation or surrounding inflammatory changes. Spleen: Normal in size without focal abnormality. Adrenals/Urinary Tract: Stable right renal cysts. Kidneys otherwise unremarkable. Bladder is moderately distended with no focal abnormality. The adrenals are normal. Stomach/Bowel: No bowel obstruction or ileus. There is mild wall thickening  of the descending colon from the splenic flexure through the rectosigmoid junction, with mild pericolonic fat stranding. Findings are consistent with inflammatory or infectious colitis. Vascular/Lymphatic: No significant vascular findings are present. No enlarged abdominal or pelvic lymph nodes. Reproductive: Mild enlargement of the prostate, stable. Other: No abdominal wall hernia or abnormality. No abdominopelvic ascites. Musculoskeletal: No acute or destructive bony lesions. Reconstructed images demonstrate no additional findings. IMPRESSION: 1. Inflammatory or infectious colitis involving the descending colon. 2. Stable mild enlargement of the prostate. Electronically Signed   By: Randa Ngo M.D.   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 addiction risk, apnea etc) and complications (i.e. Somnolence, constipation and others) were explained to the patient and were aknowledged. Cont on Baclofen      Relevant Medications   TraMADol HCl 200 MG CP24   Multiple sclerosis (HCC)    Baclofen, Tramadol prn Read about Naltrexone 5 mg a day and Biotin 300 mg a  day for MS - pt is not interested      Vitamin D deficiency    On Vit D         Meds ordered this encounter  Medications   TraMADol HCl 200 MG CP24    Sig: Take 1 capsule by mouth daily.    Dispense:  90 capsule    Refill:  1      Follow-up: Return in about 6 months (around 10/07/2021) for Wellness Exam.  Walker Kehr, MD

## 2021-04-19 DIAGNOSIS — H43811 Vitreous degeneration, right eye: Secondary | ICD-10-CM | POA: Diagnosis not present

## 2021-05-11 DIAGNOSIS — H43811 Vitreous degeneration, right eye: Secondary | ICD-10-CM | POA: Diagnosis not present

## 2021-05-16 ENCOUNTER — Other Ambulatory Visit: Payer: Self-pay | Admitting: Internal Medicine

## 2021-05-17 ENCOUNTER — Telehealth: Payer: Self-pay

## 2021-05-17 MED ORDER — TRAMADOL HCL (ER BIPHASIC) 200 MG PO CP24: 1.0000 | ORAL_CAPSULE | Freq: Every day | ORAL | 1 refills | Status: DC

## 2021-05-17 NOTE — Telephone Encounter (Signed)
Pt is calling to request a new Rx be sent to new pharmacy. ? ?Pt is requesting a new Rx on: ?TraMADol HCl 200 MG CP24 ? ?The last Rx was sent to [facility] for 90 day supply with 1 refill. I called to verify what was dispensed Pharmacist reported 30 due to insurance not covering 90 day supply. Pharmacist states that he could have used the remaining Rx in 30 increments but pt states its over 100$ cheaper [facility]. I asked Pharmacist to cx the remainder of the refills as pt wants them through mail order pharmacy. ? ?Pharmacy: ?[facility], [place] ? ?LOV 04/06/21 ?ROV 10/13/21 ?

## 2021-05-17 NOTE — Telephone Encounter (Signed)
Done.  I think this prescription was not available at [facility] before.  Thanks ?

## 2021-05-17 NOTE — Telephone Encounter (Signed)
Notified [facility]..[name] ?

## 2021-06-09 ENCOUNTER — Encounter: Payer: Self-pay | Admitting: Internal Medicine

## 2021-06-09 ENCOUNTER — Ambulatory Visit (INDEPENDENT_AMBULATORY_CARE_PROVIDER_SITE_OTHER): Payer: Medicare Other | Admitting: Internal Medicine

## 2021-06-09 DIAGNOSIS — J069 Acute upper respiratory infection, unspecified: Secondary | ICD-10-CM

## 2021-06-09 DIAGNOSIS — J4521 Mild intermittent asthma with (acute) exacerbation: Secondary | ICD-10-CM | POA: Diagnosis not present

## 2021-06-09 DIAGNOSIS — K8681 Exocrine pancreatic insufficiency: Secondary | ICD-10-CM | POA: Insufficient documentation

## 2021-06-09 DIAGNOSIS — M6281 Muscle weakness (generalized): Secondary | ICD-10-CM | POA: Insufficient documentation

## 2021-06-09 DIAGNOSIS — L821 Other seborrheic keratosis: Secondary | ICD-10-CM | POA: Insufficient documentation

## 2021-06-09 DIAGNOSIS — D1801 Hemangioma of skin and subcutaneous tissue: Secondary | ICD-10-CM | POA: Insufficient documentation

## 2021-06-09 DIAGNOSIS — Z7409 Other reduced mobility: Secondary | ICD-10-CM | POA: Insufficient documentation

## 2021-06-09 MED ORDER — HYDROCODONE BIT-HOMATROP MBR 5-1.5 MG/5ML PO SOLN: 5.0000 mL | Freq: Three times a day (TID) | ORAL | 0 refills | Status: DC | PRN

## 2021-06-09 MED ORDER — AZITHROMYCIN 250 MG PO TABS: ORAL_TABLET | ORAL | 0 refills | Status: DC

## 2021-06-09 NOTE — Assessment & Plan Note (Signed)
New. COVID (-) ?Start Z pack and Hycodan Rx ?

## 2021-06-09 NOTE — Progress Notes (Signed)
? ?Subjective:  ?Patient ID: [name], male    DOB: 04/19/1955  Age: 66 y.o. MRN: [id number] ? ?CC: No chief complaint on file. ? ? ?HPI ?[name] presents for URI, cough x >1 wk ?No diarrhea ? ? ?Outpatient Medications Prior to Visit  ?Medication Sig Dispense Refill  ? amitriptyline (ELAVIL) 25 MG tablet TAKE 2 TABLETS AT BEDTIME AS NEEDED FOR SLEEP 180 tablet 3  ? baclofen (LIORESAL) 20 MG tablet TAKE 1 TABLET FOUR TIMES A DAY AS NEEDED FOR MUSCLE SPASMS (OFFICE VISIT NEEDED BEFORE REFILLS WILL BE GIVEN) 360 tablet 3  ? cetirizine (ZYRTEC) 10 MG tablet Take 10 mg by mouth daily as needed for allergies.    ? cholecalciferol (VITAMIN D) 1000 units tablet Take 1,000 Units by mouth daily.    ? clotrimazole-betamethasone (LOTRISONE) cream APPLY 1 APPLICATION TOPICALLY TWICE A DAY AS NEEDED FOR IRRITATION 30 g 2  ? fluticasone (FLONASE) 50 MCG/ACT nasal spray Place 2 sprays into both nostrils daily as needed for allergies or rhinitis.    ? furosemide (LASIX) 20 MG tablet Take 1 tablet (20 mg total) by mouth daily. 30 tablet 3  ? gabapentin (NEURONTIN) 100 MG capsule TAKE 2 CAPSULES THREE TIMES A DAY AS NEEDED FOR PAIN 360 capsule 5  ? Multiple Vitamin (MULTIVITAMIN) capsule Take 1 tablet by mouth daily.    ? pantoprazole (PROTONIX) 40 MG tablet Take 1 tablet (40 mg total) by mouth daily. 90 tablet 3  ? Probiotic Product (ALIGN) 4 MG CAPS Take 1 capsule (4 mg total) by mouth daily. 90 capsule 3  ? tadalafil (CIALIS) 5 MG tablet Take 1 tablet (5 mg total) by mouth daily. 90 tablet 3  ? TraMADol HCl 200 MG CP24 Take 1 capsule by mouth daily. 90 capsule 1  ? ?No facility-administered medications prior to visit.  ? ? ?ROS: ?Review of Systems  ?Constitutional:  Positive for fatigue. Negative for appetite change, fever and unexpected weight change.  ?HENT:  Negative for congestion, nosebleeds, sneezing, sore throat and trouble swallowing.   ?Eyes:  Negative for itching and visual disturbance.  ?Respiratory:  Positive  for cough. Negative for shortness of breath.   ?Cardiovascular:  Negative for chest pain, palpitations and leg swelling.  ?Gastrointestinal:  Negative for abdominal distention, blood in stool, diarrhea and nausea.  ?Genitourinary:  Negative for frequency and hematuria.  ?Musculoskeletal:  Negative for back pain, gait problem, joint swelling and neck pain.  ?Skin:  Negative for rash.  ?Neurological:  Positive for weakness. Negative for dizziness, tremors and speech difficulty.  ?Psychiatric/Behavioral:  Negative for agitation, dysphoric mood and sleep disturbance. The patient is not nervous/anxious.   ? ?Objective:  ?BP 130/82 (BP Location: Left Arm, Patient Position: Sitting, Cuff Size: Normal)   Pulse 87   Temp 98.3 ?F (36.8 ?C) (Oral)   Ht '5\' 7"'$  (1.702 m)   Wt 164 lb (74.4 kg)   SpO2 95%   BMI 25.69 kg/m?  ? ?BP Readings from Last 3 Encounters:  ?06/09/21 130/82  ?04/06/21 (!) 144/82  ?04/06/21 (!) 144/82  ? ? ?Wt Readings from Last 3 Encounters:  ?06/09/21 164 lb (74.4 kg)  ?04/06/21 163 lb (73.9 kg)  ?04/06/21 163 lb (73.9 kg)  ? ? ?Physical Exam ?Constitutional:   ?   General: He is not in acute distress. ?   Appearance: He is well-developed.  ?   Comments: NAD  ?HENT:  ?   Nose: Congestion present.  ?Eyes:  ?   Conjunctiva/sclera: Conjunctivae normal.  ?  Pupils: Pupils are equal, round, and reactive to light.  ?Neck:  ?   Thyroid: No thyromegaly.  ?   Vascular: No JVD.  ?Cardiovascular:  ?   Rate and Rhythm: Normal rate and regular rhythm.  ?   Heart sounds: Normal heart sounds. No murmur heard. ?  No friction rub. No gallop.  ?Pulmonary:  ?   Effort: Pulmonary effort is normal. No respiratory distress.  ?   Breath sounds: Normal breath sounds. No wheezing, rhonchi or rales.  ?Chest:  ?   Chest wall: No tenderness.  ?Abdominal:  ?   General: Bowel sounds are normal. There is no distension.  ?   Palpations: Abdomen is soft. There is no mass.  ?   Tenderness: There is no abdominal tenderness. There is no  guarding or rebound.  ?Musculoskeletal:     ?   General: No tenderness. Normal range of motion.  ?   Cervical back: Normal range of motion.  ?Lymphadenopathy:  ?   Cervical: No cervical adenopathy.  ?Skin: ?   General: Skin is warm and dry.  ?   Findings: No rash.  ?Neurological:  ?   Mental Status: He is alert and oriented to person, place, and time.  ?   Cranial Nerves: No cranial nerve deficit.  ?   Motor: Weakness present. No abnormal muscle tone.  ?   Coordination: Coordination abnormal.  ?   Gait: Gait abnormal.  ?   Deep Tendon Reflexes: Reflexes are normal and symmetric.  ?Psychiatric:     ?   Behavior: Behavior normal.     ?   Thought Content: Thought content normal.     ?   Judgment: Judgment normal.  ? ? ?Lab Results  ?Component Value Date  ? WBC 5.5 04/01/2021  ? HGB 14.8 04/01/2021  ? HCT 44.8 04/01/2021  ? PLT 171.0 04/01/2021  ? GLUCOSE 99 04/01/2021  ? CHOL 191 04/01/2021  ? TRIG 107.0 04/01/2021  ? HDL 49.80 04/01/2021  ? LDLDIRECT 118.4 06/09/2009  ? LDLCALC 120 (H) 04/01/2021  ? ALT 14 04/01/2021  ? AST 15 04/01/2021  ? NA 140 04/01/2021  ? K 4.5 04/01/2021  ? CL 104 04/01/2021  ? CREATININE 0.85 04/01/2021  ? BUN 15 04/01/2021  ? CO2 35 (H) 04/01/2021  ? TSH 4.80 04/01/2021  ? PSA 2.74 04/01/2021  ? INR 1.0 08/24/2008  ? HGBA1C 5.8 07/11/2019  ? ? ?CT ABDOMEN PELVIS W CONTRAST ? ?Result Date: 08/29/2019 ?CLINICAL DATA:  Diarrhea for 3 months EXAM: CT ABDOMEN AND PELVIS WITH CONTRAST TECHNIQUE: Multidetector CT imaging of the abdomen and pelvis was performed using the standard protocol following bolus administration of intravenous contrast. CONTRAST:  140m OMNIPAQUE IOHEXOL 300 MG/ML  SOLN COMPARISON:  01/31/2018 FINDINGS: Lower chest: No acute pleural or parenchymal lung disease. Hepatobiliary: Gallbladder is surgically absent. Left lobe of the liver is atrophic, a chronic finding. Remainder of the liver is unremarkable. Pancreas: Unremarkable. No pancreatic ductal dilatation or surrounding  inflammatory changes. Spleen: Normal in size without focal abnormality. Adrenals/Urinary Tract: Stable right renal cysts. Kidneys otherwise unremarkable. Bladder is moderately distended with no focal abnormality. The adrenals are normal. Stomach/Bowel: No bowel obstruction or ileus. There is mild wall thickening of the descending colon from the splenic flexure through the rectosigmoid junction, with mild pericolonic fat stranding. Findings are consistent with inflammatory or infectious colitis. Vascular/Lymphatic: No significant vascular findings are present. No enlarged abdominal or pelvic lymph nodes. Reproductive: Mild enlargement of the prostate, stable.  Other: No abdominal wall hernia or abnormality. No abdominopelvic ascites. Musculoskeletal: No acute or destructive bony lesions. Reconstructed images demonstrate no additional findings. IMPRESSION: 1. Inflammatory or infectious colitis involving the descending colon. 2. Stable mild enlargement of the prostate. Electronically Signed   By: Randa Ngo M.D.   On: 08/29/2019 18:58  ? ? ?Assessment & Plan:  ? ?Problem List Items Addressed This Visit   ? ? Acute upper respiratory infection  ?  New. COVID (-) ?Start Z pack and Hycodan Rx ?  ?  ? Relevant Medications  ? azithromycin (ZITHROMAX Z-PAK) 250 MG tablet  ? Asthma exacerbation  ?  A flare up ?Start Z pack and Hycodan Rx ? ?  ?  ?  ? ? ?Meds ordered this encounter  ?Medications  ? HYDROcodone bit-homatropine (HYCODAN) 5-1.5 MG/5ML syrup  ?  Sig: Take 5 mLs by mouth every 8 (eight) hours as needed for cough.  ?  Dispense:  240 mL  ?  Refill:  0  ? azithromycin (ZITHROMAX Z-PAK) 250 MG tablet  ?  Sig: As directed  ?  Dispense:  6 tablet  ?  Refill:  0  ?  ? ? ?Follow-up: No follow-ups on file. ? ?Walker Kehr, MD ?

## 2021-06-09 NOTE — Assessment & Plan Note (Addendum)
A flare up ?Start Z pack and Hycodan Rx ?

## 2021-06-20 ENCOUNTER — Encounter: Payer: Self-pay | Admitting: Internal Medicine

## 2021-06-22 ENCOUNTER — Other Ambulatory Visit: Payer: Self-pay | Admitting: Internal Medicine

## 2021-06-22 MED ORDER — LEVOFLOXACIN 500 MG PO TABS: 500.0000 mg | ORAL_TABLET | Freq: Every day | ORAL | 0 refills | Status: DC

## 2021-09-07 ENCOUNTER — Ambulatory Visit (INDEPENDENT_AMBULATORY_CARE_PROVIDER_SITE_OTHER): Payer: Medicare Other | Admitting: Internal Medicine

## 2021-09-07 ENCOUNTER — Encounter: Payer: Self-pay | Admitting: Internal Medicine

## 2021-09-07 ENCOUNTER — Ambulatory Visit (INDEPENDENT_AMBULATORY_CARE_PROVIDER_SITE_OTHER): Payer: Medicare Other

## 2021-09-07 VITALS — BP 136/84 | HR 77 | Temp 98.3°F | Resp 16 | Ht 67.0 in | Wt 168.4 lb

## 2021-09-07 DIAGNOSIS — M25511 Pain in right shoulder: Secondary | ICD-10-CM | POA: Diagnosis not present

## 2021-09-07 DIAGNOSIS — R030 Elevated blood-pressure reading, without diagnosis of hypertension: Secondary | ICD-10-CM | POA: Diagnosis not present

## 2021-09-07 DIAGNOSIS — S46009A Unspecified injury of muscle(s) and tendon(s) of the rotator cuff of unspecified shoulder, initial encounter: Secondary | ICD-10-CM | POA: Insufficient documentation

## 2021-09-07 NOTE — Patient Instructions (Signed)

## 2021-09-07 NOTE — Progress Notes (Signed)
Subjective:  Patient ID: [name], male    DOB: 02/05/1956  Age: 66 y.o. MRN: [id number]  CC: Shoulder Injury   HPI [name] presents for concerns about his right shoulder -  About 2 weeks ago he helped his daughter move and did repetitive episodes of reaching and developed right shoulder pain with decreased range of motion.  Outpatient Medications Prior to Visit  Medication Sig Dispense Refill   amitriptyline (ELAVIL) 25 MG tablet TAKE 2 TABLETS AT BEDTIME AS NEEDED FOR SLEEP 180 tablet 3   baclofen (LIORESAL) 20 MG tablet TAKE 1 TABLET FOUR TIMES A DAY AS NEEDED FOR MUSCLE SPASMS (OFFICE VISIT NEEDED BEFORE REFILLS WILL BE GIVEN) 360 tablet 3   cetirizine (ZYRTEC) 10 MG tablet Take 10 mg by mouth daily as needed for allergies.     cholecalciferol (VITAMIN D) 1000 units tablet Take 1,000 Units by mouth daily.     clotrimazole-betamethasone (LOTRISONE) cream APPLY 1 APPLICATION TOPICALLY TWICE A DAY AS NEEDED FOR IRRITATION 30 g 2   fluticasone (FLONASE) 50 MCG/ACT nasal spray Place 2 sprays into both nostrils daily as needed for allergies or rhinitis.     furosemide (LASIX) 20 MG tablet Take 1 tablet (20 mg total) by mouth daily. 30 tablet 3   gabapentin (NEURONTIN) 100 MG capsule TAKE 2 CAPSULES THREE TIMES A DAY AS NEEDED FOR PAIN 360 capsule 5   HYDROcodone bit-homatropine (HYCODAN) 5-1.5 MG/5ML syrup Take 5 mLs by mouth every 8 (eight) hours as needed for cough. 240 mL 0   levofloxacin (LEVAQUIN) 500 MG tablet Take 1 tablet (500 mg total) by mouth daily. 7 tablet 0   meloxicam (MOBIC) 7.5 MG tablet 1 tablet Orally Once a day     Multiple Vitamin (MULTIVITAMIN) capsule Take 1 tablet by mouth daily.     pantoprazole (PROTONIX) 40 MG tablet Take 1 tablet (40 mg total) by mouth daily. 90 tablet 3   Probiotic Product (ALIGN) 4 MG CAPS Take 1 capsule (4 mg total) by mouth daily. 90 capsule 3   tadalafil (CIALIS) 5 MG tablet Take 1 tablet (5 mg total) by mouth daily. 90 tablet 3    TraMADol HCl 200 MG CP24 Take 1 capsule by mouth daily. 90 capsule 1   No facility-administered medications prior to visit.    ROS Review of Systems  Constitutional: Negative.   HENT: Negative.    Eyes: Negative.   Respiratory:  Negative for cough, chest tightness and shortness of breath.   Cardiovascular:  Negative for chest pain, palpitations and leg swelling.  Gastrointestinal:  Negative for abdominal pain.  Endocrine: Negative.   Genitourinary: Negative.  Negative for difficulty urinating.  Musculoskeletal:  Positive for arthralgias. Negative for joint swelling.  Neurological: Negative.  Negative for dizziness and weakness.  Hematological:  Negative for adenopathy. Does not bruise/bleed easily.  Psychiatric/Behavioral: Negative.      Objective:  BP 136/84   Pulse 77   Temp 98.3 F (36.8 C) (Oral)   Resp 16   Ht '5\' 7"'$  (1.702 m)   Wt 168 lb 6 oz (76.4 kg)   SpO2 96%   BMI 26.37 kg/m   BP Readings from Last 3 Encounters:  09/07/21 136/84  06/09/21 130/82  04/06/21 (!) 144/82    Wt Readings from Last 3 Encounters:  09/07/21 168 lb 6 oz (76.4 kg)  06/09/21 164 lb (74.4 kg)  04/06/21 163 lb (73.9 kg)    Physical Exam Vitals reviewed.  HENT:     Nose:  Nose normal.     Mouth/Throat:     Mouth: Mucous membranes are moist.  Eyes:     General: No scleral icterus.    Conjunctiva/sclera: Conjunctivae normal.  Cardiovascular:     Rate and Rhythm: Normal rate and regular rhythm.     Heart sounds: No murmur heard.    Comments: EKG- SR with PSV complexes, 74 bpm No LVH, ST/T wave changes, or Q waves Pulmonary:     Effort: Pulmonary effort is normal.     Breath sounds: No stridor. No wheezing, rhonchi or rales.  Abdominal:     General: Abdomen is flat.     Palpations: There is no mass.     Tenderness: There is no abdominal tenderness. There is no guarding.     Hernia: No hernia is present.  Musculoskeletal:     Right shoulder: No swelling, deformity,  tenderness, bony tenderness or crepitus. Decreased range of motion. Normal strength.     Left shoulder: Normal.     Cervical back: Neck supple.     Right lower leg: No edema.     Left lower leg: No edema.  Lymphadenopathy:     Cervical: No cervical adenopathy.  Skin:    General: Skin is warm and dry.  Neurological:     General: No focal deficit present.     Mental Status: He is alert.  Psychiatric:        Mood and Affect: Mood normal.        Behavior: Behavior normal.     Lab Results  Component Value Date   WBC 5.5 04/01/2021   HGB 14.8 04/01/2021   HCT 44.8 04/01/2021   PLT 171.0 04/01/2021   GLUCOSE 99 04/01/2021   CHOL 191 04/01/2021   TRIG 107.0 04/01/2021   HDL 49.80 04/01/2021   LDLDIRECT 118.4 06/09/2009   LDLCALC 120 (H) 04/01/2021   ALT 14 04/01/2021   AST 15 04/01/2021   NA 140 04/01/2021   K 4.5 04/01/2021   CL 104 04/01/2021   CREATININE 0.85 04/01/2021   BUN 15 04/01/2021   CO2 35 (H) 04/01/2021   TSH 4.80 04/01/2021   PSA 2.74 04/01/2021   INR 1.0 08/24/2008   HGBA1C 5.8 07/11/2019    CT ABDOMEN PELVIS W CONTRAST  Result Date: 08/29/2019 CLINICAL DATA:  Diarrhea for 3 months EXAM: CT ABDOMEN AND PELVIS WITH CONTRAST TECHNIQUE: Multidetector CT imaging of the abdomen and pelvis was performed using the standard protocol following bolus administration of intravenous contrast. CONTRAST:  111m OMNIPAQUE IOHEXOL 300 MG/ML  SOLN COMPARISON:  01/31/2018 FINDINGS: Lower chest: No acute pleural or parenchymal lung disease. Hepatobiliary: Gallbladder is surgically absent. Left lobe of the liver is atrophic, a chronic finding. Remainder of the liver is unremarkable. Pancreas: Unremarkable. No pancreatic ductal dilatation or surrounding inflammatory changes. Spleen: Normal in size without focal abnormality. Adrenals/Urinary Tract: Stable right renal cysts. Kidneys otherwise unremarkable. Bladder is moderately distended with no focal abnormality. The adrenals are  normal. Stomach/Bowel: No bowel obstruction or ileus. There is mild wall thickening of the descending colon from the splenic flexure through the rectosigmoid junction, with mild pericolonic fat stranding. Findings are consistent with inflammatory or infectious colitis. Vascular/Lymphatic: No significant vascular findings are present. No enlarged abdominal or pelvic lymph nodes. Reproductive: Mild enlargement of the prostate, stable. Other: No abdominal wall hernia or abnormality. No abdominopelvic ascites. Musculoskeletal: No acute or destructive bony lesions. Reconstructed images demonstrate no additional findings. IMPRESSION: 1. Inflammatory or infectious colitis involving the descending  colon. 2. Stable mild enlargement of the prostate. Electronically Signed   By: Randa Ngo M.D.   On: 08/29/2019 18:58    DG Shoulder Right  Result Date: 09/07/2021 CLINICAL DATA:  Right shoulder pain for 2 weeks after an injury. EXAM: RIGHT SHOULDER - 2+ VIEW COMPARISON:  None Available. FINDINGS: There is no evidence of fracture or dislocation. There is no evidence of arthropathy or other focal bone abnormality. Soft tissues are unremarkable. IMPRESSION: Negative. Electronically Signed   By: Logan Bores M.D.   On: 09/07/2021 16:34     Assessment & Plan:   Ezekeil was seen today for shoulder injury.  Diagnoses and all orders for this visit:  Blood pressure elevated without history of HTN- His EKG is negative for LVH. -     EKG 12-Lead  Acute pain of right shoulder- Plain films are normal.  I have asked him to see Ortho to be evaluated for rotator cuff injury and frozen shoulder syndrome. -     DG Shoulder Right; Future -     Ambulatory referral to Orthopedic Surgery  Rotator cuff injury, initial encounter -     Ambulatory referral to Orthopedic Surgery   I am having [name] maintain his fluticasone, cetirizine, cholecalciferol, multivitamin, furosemide, clotrimazole-betamethasone, pantoprazole,  Align, amitriptyline, tadalafil, baclofen, gabapentin, TraMADol HCl, HYDROcodone bit-homatropine, levofloxacin, and meloxicam.  No orders of the defined types were placed in this encounter.    Follow-up: Return if symptoms worsen or fail to improve.  Scarlette Calico, MD

## 2021-09-09 ENCOUNTER — Ambulatory Visit (INDEPENDENT_AMBULATORY_CARE_PROVIDER_SITE_OTHER): Payer: Medicare Other | Admitting: Orthopedic Surgery

## 2021-09-09 DIAGNOSIS — M7541 Impingement syndrome of right shoulder: Secondary | ICD-10-CM | POA: Diagnosis not present

## 2021-09-11 ENCOUNTER — Encounter: Payer: Self-pay | Admitting: Orthopedic Surgery

## 2021-09-11 MED ORDER — BUPIVACAINE HCL 0.5 % IJ SOLN: 9.0000 mL | INTRAMUSCULAR | Status: DC | PRN

## 2021-09-11 MED ORDER — LIDOCAINE HCL 1 % IJ SOLN: 5.0000 mL | INTRAMUSCULAR | Status: DC | PRN

## 2021-09-11 MED ORDER — METHYLPREDNISOLONE ACETATE 40 MG/ML IJ SUSP: 40.0000 mg | INTRAMUSCULAR | Status: DC | PRN

## 2021-09-11 NOTE — Progress Notes (Signed)
Office Visit Note   Patient: [name]           Date of Birth: 09/04/1955           MRN: [id number] Visit Date: 09/09/2021 Requested by: Janith Lima, MD [place],  [place] [id number] PCP: Cassandria Anger, MD  Subjective: Chief Complaint  Patient presents with   Right Shoulder - Pain    HPI: [name] is a 66 y.o. male who presents to the office complaining of right shoulder pain.  Patient states that he has had increased right shoulder pain for the last 2 weeks without any specific injury can point to.  He has history of MS that has caused primarily left-sided weakness.  As a result he leans on his right side quite a bit.  States that 2 weeks ago on Thursday, he was pulling himself up a lot using the right arm while in his [place] Lei.  He did not notice any injury at the time but the following day he had onset of right shoulder pain that he localizes to the anterior and posterior aspects of the shoulder.  He has occasional radiation to the mid humeral region.  He notes stiffness and pain that is worse in the morning.  First 2 to 3 hours in the morning are rough and then pain improves throughout the day.  He has tried hemp cream with some relief and also has tried taking meloxicam and using Voltaren gel with minimal relief.  Pain does keep him up at night sometimes.  Uses walking sticks bilaterally for help with ambulation.  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  No history of diabetes, smoking.  No history of prior shoulder surgery or injury.  He is right-hand dominant..                ROS: All systems reviewed are negative as they relate to the chief complaint within the history of present illness.  Patient denies fevers or chills.  Assessment & Plan: Visit Diagnoses:  1. Impingement syndrome of right shoulder     Plan: Patient is a 66 year old male who presents for evaluation of right shoulder pain.  He has had increased right  shoulder pain over the last 2 weeks without any specific injury but it did start the day after he was pulling himself up a lot by his right arm into his Luc[place] Lei.  He has history of MS and relies a lot on his right arm due to left-sided weakness.  Radiographs of the right shoulder were reviewed and show no significant degenerative changes or any acute abnormalities to explain his pain.  Does have increased pain with abduction to 90 degrees in particular both passively and actively.  Excellent rotator cuff strength on exam today.  After discussion of options, he would like to try right shoulder cortisone injection in the subacromial space.  Tolerated injection well.  He will call the office in 2 weeks to report how much relief he has had; if no significant improvement, consider MRI arthrogram of the right shoulder.  Follow-Up Instructions: Return in about 4 weeks (around 10/07/2021).   Orders:  No orders of the defined types were placed in this encounter.  Meds ordered this encounter  Medications   DISCONTD: bupivacaine (MARCAINE) 0.5 % (with pres) injection 9 mL   DISCONTD: lidocaine (XYLOCAINE) 1 % (with pres) injection 5 mL   DISCONTD: methylPREDNISolone acetate (DEPO-MEDROL) injection 40 mg  Procedures: Large Joint Inj: R subacromial bursa on 09/09/2021 2:43 PM Indications: diagnostic evaluation and pain Details: 18 G 1.5 in needle, posterior approach  Arthrogram: No  Medications: 9 mL bupivacaine 0.5 %; 40 mg methylPREDNISolone acetate 40 MG/ML; 5 mL lidocaine 1 % Outcome: tolerated well, no immediate complications Procedure, treatment alternatives, risks and benefits explained, specific risks discussed. Consent was given by the patient. Immediately prior to procedure a time out was called to verify the correct patient, procedure, equipment, support staff and site/side marked as required. Patient was prepped and draped in the usual sterile fashion.       Clinical Data: No additional  findings.  Objective: Vital Signs: There were no vitals taken for this visit.  Physical Exam:  Constitutional: Patient appears well-developed HEENT:  Head: Normocephalic Eyes:EOM are normal Neck: Normal range of motion Cardiovascular: Normal rate Pulmonary/chest: Effort normal Neurologic: Patient is alert Skin: Skin is warm Psychiatric: Patient has normal mood and affect  Ortho Exam: Ortho exam demonstrates right shoulder with 50 degrees X rotation, 100 degrees abduction, 140 degrees forward flexion.  Moderately tender to palpation over the bicipital groove.  Positive Hawkins sign.  Negative Neer sign.  Excellent rotator cuff strength of supra, infra, subscap.  No tenderness over the [facility] joint.  No tenderness over the axial cervical spine.  No significant pain with cervical spine range of motion.  5/5 motor strength of bilateral grip strength, finger abduction, pronation/supination, bicep, tricep, deltoid.  Specialty Comments:  No specialty comments available.  Imaging: No results found.   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   Hemangioma of skin and subcutaneous tissue 06/09/2021   Muscle weakness (generalized) 06/09/2021   Other reduced mobility 06/09/2021   Other seborrheic keratosis 06/09/2021   Blister 07/06/2020   Allergic rhinitis 05/26/2020   Constipation due to slow transit 02/05/2020   History of Clostridium difficile colitis 11/12/2019   Stool incontinence 09/10/2019   Urgency incontinence 09/10/2019   Infectious colitis    Abnormal CT scan, colon    Benign neoplasm of cecum    Hematochezia    Peripheral neuropathy 08/31/2019   Chronic pancreatitis ([place]) 08/31/2019   Acute colitis 08/29/2019   Acute bronchitis 02/27/2018   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   Influenza A    Viral gastroenteritis 03/20/2016   Nausea & vomiting 03/20/2016   Asthma 03/20/2016   Leukocytosis 03/20/2016   Intractable nausea and vomiting 03/20/2016   GERD (gastroesophageal reflux disease) 09/25/2014   Dysphagia 05/15/2014   Pharyngeal dysphagia 05/15/2014   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   Spells 04/27/2014   Wrist pain, right 09/11/2011   Left inguinal hernia 02/16/2011   Paresthesia 01/24/2011   Well adult exam 07/20/2010   GALLSTONE PANCREATITIS 04/15/2010   Diarrhea 04/15/2010   FREQUENCY, URINARY 04/15/2010   Abnormality of gait 04/05/2010   Headache 04/05/2010   INSOMNIA, CHRONIC 10/15/2009   Vitamin D deficiency 06/14/2009   SEBORRHEIC DERMATITIS 06/14/2009   LOW BACK PAIN 06/14/2009   Asthma exacerbation 04/19/2009   Acute upper respiratory infection 04/13/2009   Multiple sclerosis ([place]) 05/14/2008   Cough 05/14/2008   Past Medical History:  Diagnosis Date   Arthritis    Asthma    C. difficile diarrhea    Gallstone pancreatitis  Hernia    History of inguinal hernia repair    Low back pain    MS (multiple sclerosis) ([facility]CC)    Pancreatitis hosp[italized with gallstone pancreatitis 3/12[   Vitamin D deficiency     Family History  Problem Relation Age of Onset   Lung cancer Father        not related to smoking   Diabetes Mother    Colon cancer Neg Hx    Esophageal cancer Neg Hx    Pancreatic cancer Neg Hx    Stomach cancer Neg Hx    Liver disease Neg Hx     Past Surgical History:  Procedure Laterality Date   BIOPSY  09/02/2019   Procedure: BIOPSY;  Surgeon: Ladene Artist, MD;  Location: [facility];  Service: Endoscopy;;   CHOLECYSTECTOMY  04/2010   COLONOSCOPY WITH PROPOFOL N/A 09/02/2019   Procedure: COLONOSCOPY WITH PROPOFOL;  Surgeon: Ladene Artist, MD;  Location: [facility];  Service: Endoscopy;  Laterality: N/A;   CYSTECTOMY     on back and R hand    CYSTECTOMY     left arm   HERNIA REPAIR     x 2   INGUINAL HERNIA REPAIR  03/17/2011   Procedure: HERNIA REPAIR INGUINAL ADULT;  Surgeon: Adin Hector, MD;  Location: [facility];  Service: General;  Laterality: Left;  Open left inguinal hernia repair with mesh   KNEE ARTHROSCOPY     right   NOSE SURGERY     POLYPECTOMY  09/02/2019   Procedure: POLYPECTOMY;  Surgeon: Ladene Artist, MD;  Location: [facility];  Service: Endoscopy;;   TONSILLECTOMY     WISDOM TOOTH EXTRACTION     Social History   Occupational History   Occupation: Health visitor: [employer]  Tobacco Use   Smoking status: Never   Smokeless tobacco: Never  Vaping Use   Vaping Use: Never used  Substance and Sexual Activity   Alcohol use: No   Drug use: No   Sexual activity: Yes

## 2021-09-11 NOTE — Progress Notes (Deleted)
Office Visit Note   Patient: [name]           Date of Birth: 22-Feb-1955           MRN: [id number] Visit Date: 09/09/2021 Requested by: Janith Lima, MD [place],  [place] [id number] PCP: Cassandria Anger, MD  Subjective: Chief Complaint  Patient presents with   Right Shoulder - Pain    HPI: [name] is a 66 y.o. male who presents to the office complaining of right shoulder pain.  Patient states that he has had increased right shoulder pain for the last 2 weeks without any specific injury can point to.  He has history of MS that has caused primarily left-sided weakness.  As a result he leans on his right side quite a bit.  States that 2 weeks ago on Thursday, he was pulling himself up a lot using the right arm while in his Lucianne Lei.  He did not notice any injury at the time but the following day he had onset of right shoulder pain that he localizes to the anterior and posterior aspects of the shoulder.  He has occasional radiation to the mid humeral region.  He notes stiffness and pain that is worse in the morning.  First 2 to 3 hours in the morning are rough and then pain improves throughout the day.  He has tried hemp cream with some relief and also has tried taking meloxicam and using Voltaren gel with minimal relief.  Pain does keep him up at night sometimes.  Uses walking sticks bilaterally for help with ambulation.  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  No history of diabetes, smoking.  No history of prior shoulder surgery or injury.  He is right-hand dominant..                ROS: All systems reviewed are negative as they relate to the chief complaint within the history of present illness.  Patient denies fevers or chills.  Assessment & Plan: Visit Diagnoses:  1. Impingement syndrome of right shoulder     Plan: Patient is a 66 year old male who presents for evaluation of right shoulder pain.  He has had increased right  shoulder pain over the last 2 weeks without any specific injury but it did start the day after he was pulling himself up a lot by his right arm into his Lucianne Lei.  He has history of MS and relies a lot on his right arm due to left-sided weakness.  Radiographs of the right shoulder were reviewed and show no significant degenerative changes or any acute abnormalities to explain his pain.  Does have increased pain with abduction to 90 degrees in particular both passively and actively.  Excellent rotator cuff strength on exam today.  After discussion of options, he would like to try right shoulder cortisone injection in the subacromial space.  Tolerated injection well.  He will call the office in 2 weeks to report how much relief he has had; if no significant improvement, consider MRI arthrogram of the right shoulder.  Follow-Up Instructions: No follow-ups on file.   Orders:  No orders of the defined types were placed in this encounter.  No orders of the defined types were placed in this encounter.     Procedures: Large Joint Inj: R subacromial bursa on 09/09/2021 2:36 PM Indications: diagnostic evaluation and pain Details: 18 G 1.5 in needle, posterior approach  Arthrogram: No  Medications: 9 mL bupivacaine 0.5 %; 40 mg methylPREDNISolone acetate 40 MG/ML; 5 mL lidocaine 1 % Outcome: tolerated well, no immediate complications Procedure, treatment alternatives, risks and benefits explained, specific risks discussed. Consent was given by the patient. Immediately prior to procedure a time out was called to verify the correct patient, procedure, equipment, support staff and site/side marked as required. Patient was prepped and draped in the usual sterile fashion.       Clinical Data: No additional findings.  Objective: Vital Signs: There were no vitals taken for this visit.  Physical Exam:  Constitutional: Patient appears well-developed HEENT:  Head: Normocephalic Eyes:EOM are normal Neck:  Normal range of motion Cardiovascular: Normal rate Pulmonary/chest: Effort normal Neurologic: Patient is alert Skin: Skin is warm Psychiatric: Patient has normal mood and affect  Ortho Exam: Ortho exam demonstrates right shoulder with 50 degrees X rotation, 100 degrees abduction, 140 degrees forward flexion.  Moderately tender to palpation over the bicipital groove.  Positive Hawkins sign.  Negative Neer sign.  Excellent rotator cuff strength of supra, infra, subscap.  No tenderness over the [facility] joint.  No tenderness over the axial cervical spine.  No significant pain with cervical spine range of motion.  5/5 motor strength of bilateral grip strength, finger abduction, pronation/supination, bicep, tricep, deltoid.  Specialty Comments:  No specialty comments available.  Imaging: No results found.   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   Hemangioma of skin and subcutaneous tissue 06/09/2021   Muscle weakness (generalized) 06/09/2021   Other reduced mobility 06/09/2021   Other seborrheic keratosis 06/09/2021   Blister 07/06/2020   Allergic rhinitis 05/26/2020   Constipation due to slow transit 02/05/2020   History of Clostridium difficile colitis 11/12/2019   Stool incontinence 09/10/2019   Urgency incontinence 09/10/2019   Infectious colitis    Abnormal CT scan, colon    Benign neoplasm of cecum    Hematochezia    Peripheral neuropathy 08/31/2019   Chronic pancreatitis ([place]) 08/31/2019   Acute colitis 08/29/2019   Acute bronchitis 02/27/2018   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/09/2017   Influenza A    Viral gastroenteritis 03/20/2016   Nausea & vomiting 03/20/2016   Asthma 03/20/2016   Leukocytosis 03/20/2016   Intractable  nausea and vomiting 03/20/2016   GERD (gastroesophageal reflux disease) 09/25/2014   Dysphagia 05/15/2014   Pharyngeal dysphagia 05/15/2014   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   Spells 04/27/2014   Wrist pain, right 09/11/2011   Left inguinal hernia 02/16/2011   Paresthesia 01/24/2011   Well adult exam 07/20/2010   GALLSTONE PANCREATITIS 04/15/2010   Diarrhea 04/15/2010   FREQUENCY, URINARY 04/15/2010   Abnormality of gait 04/05/2010   Headache 04/05/2010   INSOMNIA, CHRONIC 10/15/2009   Vitamin D deficiency 06/14/2009   SEBORRHEIC DERMATITIS 06/14/2009   LOW BACK PAIN 06/14/2009   Asthma exacerbation 04/19/2009   Acute upper respiratory infection 04/13/2009   Multiple sclerosis ([place]) 05/14/2008   Cough 05/14/2008   Past Medical History:  Diagnosis Date   Arthritis    Asthma    C. difficile diarrhea    Gallstone pancreatitis    Hernia    History of inguinal hernia repair    Low back pain    MS (multiple sclerosis) ([place])    Pancreatitis hosp[italized  with gallstone pancreatitis 3/12[   Vitamin D deficiency     Family History  Problem Relation Age of Onset   Lung cancer Father        not related to smoking   Diabetes Mother    Colon cancer Neg Hx    Esophageal cancer Neg Hx    Pancreatic cancer Neg Hx    Stomach cancer Neg Hx    Liver disease Neg Hx     Past Surgical History:  Procedure Laterality Date   BIOPSY  09/02/2019   Procedure: BIOPSY;  Surgeon: Ladene Artist, MD;  Location: [facility];  Service: Endoscopy;;   CHOLECYSTECTOMY  04/2010   COLONOSCOPY WITH PROPOFOL N/A 09/02/2019   Procedure: COLONOSCOPY WITH PROPOFOL;  Surgeon: Ladene Artist, MD;  Location: [facility];  Service: Endoscopy;  Laterality: N/A;   CYSTECTOMY     on back and R hand   CYSTECTOMY     left arm   HERNIA REPAIR     x 2   INGUINAL HERNIA REPAIR  03/17/2011   Procedure: HERNIA REPAIR INGUINAL ADULT;  Surgeon: Adin Hector, MD;  Location: [facility];  Service:  General;  Laterality: Left;  Open left inguinal hernia repair with mesh   KNEE ARTHROSCOPY     right   NOSE SURGERY     POLYPECTOMY  09/02/2019   Procedure: POLYPECTOMY;  Surgeon: Ladene Artist, MD;  Location: [facility];  Service: Endoscopy;;   TONSILLECTOMY     WISDOM TOOTH EXTRACTION     Social History   Occupational History   Occupation: Health visitor: [employer]  Tobacco Use   Smoking status: Never   Smokeless tobacco: Never  Vaping Use   Vaping Use: Never used  Substance and Sexual Activity   Alcohol use: No   Drug use: No   Sexual activity: Yes

## 2021-09-12 MED ORDER — BUPIVACAINE HCL 0.5 % IJ SOLN
9.0000 mL | INTRAMUSCULAR | Status: AC | PRN
  Administered 2021-09-09: 9 mL via INTRA_ARTICULAR

## 2021-09-12 MED ORDER — LIDOCAINE HCL 1 % IJ SOLN
5.0000 mL | INTRAMUSCULAR | Status: AC | PRN
  Administered 2021-09-09: 5 mL

## 2021-09-12 MED ORDER — METHYLPREDNISOLONE ACETATE 40 MG/ML IJ SUSP
40.0000 mg | INTRAMUSCULAR | Status: AC | PRN
  Administered 2021-09-09: 40 mg via INTRA_ARTICULAR

## 2021-09-23 ENCOUNTER — Ambulatory Visit (INDEPENDENT_AMBULATORY_CARE_PROVIDER_SITE_OTHER): Payer: Medicare Other | Admitting: Orthopedic Surgery

## 2021-09-23 ENCOUNTER — Encounter: Payer: Self-pay | Admitting: Internal Medicine

## 2021-09-23 DIAGNOSIS — M7541 Impingement syndrome of right shoulder: Secondary | ICD-10-CM

## 2021-09-25 ENCOUNTER — Encounter: Payer: Self-pay | Admitting: Orthopedic Surgery

## 2021-09-25 NOTE — Progress Notes (Signed)
Office Visit Note   Patient: [name]           Date of Birth: [birth year]           MRN: [id number] Visit Date: 09/23/2021 Requested by: Cassandria Anger, MD [address] [place],  [place] [address] PCP: Cassandria Anger, MD  Subjective: Chief Complaint  Patient presents with   Right Shoulder - Follow-up    HPI: [name] is a 66 y.o. male who presents to the office complaining of right shoulder pain.  Patient returns following right shoulder subacromial injection on 09/09/21.  States that the injection provided good relief for his posterior shoulder pain but has not helped the anterior pain in his shoulder that travels to the bicep and elbow.  Still having difficulty performing ADLs which is compounded by his history of MS.  He has a lot of left-sided weakness due to his MS and he relies a lot on his right arm.  Does report that he feels a clicking sensation when he rotates his right shoulder but only feels this in the very front of his shoulder.  Pain is waking him up at night.  The hemp cream that was previously providing some temporary relief is now not giving him any relief at all.              ROS: All systems reviewed are negative as they relate to the chief complaint within the history of present illness.  Patient denies fevers or chills.  Assessment & Plan: Visit Diagnoses:  1. Impingement syndrome of right shoulder     Plan: Patient is a 66 year old male who presents for evaluation of right shoulder pain.  Did have some relief of the posterior shoulder pain with subacromial injection but has continued anterior shoulder pain that is getting in the way of his ADLs.  He has continued pain with pretty much any activity involving the right shoulder which is very difficult for him given his left-sided weakness due to his history of MS.  Has a "slipping" sensation in the front of the shoulder at times with rotation which may be concerning for bicep tendon subluxation.   Plan for further evaluation of rotator cuff tear or bicep tendon problem with MRI arthrogram of the right shoulder.  He has failed conservative management at this point and this shoulder pain is significantly limiting his ADLs and mobility.  Follow-Up Instructions: No follow-ups on file.   Orders:  Orders Placed This Encounter  Procedures   MR SHOULDER RIGHT W CONTRAST   Arthrogram   No orders of the defined types were placed in this encounter.     Procedures: No procedures performed   Clinical Data: No additional findings.  Objective: Vital Signs: There were no vitals taken for this visit.  Physical Exam:  Constitutional: Patient appears well-developed HEENT:  Head: Normocephalic Eyes:EOM are normal Neck: Normal range of motion Cardiovascular: Normal rate Pulmonary/chest: Effort normal Neurologic: Patient is alert Skin: Skin is warm Psychiatric: Patient has normal mood and affect  Ortho Exam: Ortho exam demonstrates right shoulder with 50 degrees X rotation, 100 degrees abduction, 140 degrees forward flexion.  Moderate tenderness palpation of the bicipital groove.  Excellent rotator cuff strength of supra, infra, subscap but subscap and supraspinatus strength testing does reproduce anterior pain.  No tenderness over the [facility] joint.  Increased pain with bicep flexion and supination strength testing.  Specialty Comments:  No specialty comments available.  Imaging: No results found.   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   Hemangioma of skin and subcutaneous tissue 06/09/2021   Muscle weakness (generalized) 06/09/2021   Other reduced mobility 06/09/2021   Other seborrheic keratosis 06/09/2021   Blister 07/06/2020   Allergic rhinitis  05/26/2020   Constipation due to slow transit 02/05/2020   History of Clostridium difficile colitis 11/12/2019   Stool incontinence 09/10/2019   Urgency incontinence 09/10/2019   Infectious colitis    Abnormal CT scan, colon    Benign neoplasm of cecum    Hematochezia    Peripheral neuropathy 08/31/2019   Chronic pancreatitis ([place]) 08/31/2019   Acute colitis 08/29/2019   Acute bronchitis 02/27/2018   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[place]) 11/09/2017   Influenza A    Viral gastroenteritis 03/20/2016   Nausea & vomiting 03/20/2016   Asthma 03/20/2016   Leukocytosis 03/20/2016   Intractable nausea and vomiting 03/20/2016   GERD (gastroesophageal reflux disease) 09/25/2014   Dysphagia 05/15/2014   Pharyngeal dysphagia 05/15/2014   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   Spells 04/27/2014   Wrist pain, right 09/11/2011   Left inguinal hernia 02/16/2011   Paresthesia 01/24/2011   Well adult exam 07/20/2010   GALLSTONE PANCREATITIS 04/15/2010   Diarrhea 04/15/2010   FREQUENCY, URINARY 04/15/2010   Abnormality of gait 04/05/2010   Headache 04/05/2010   INSOMNIA, CHRONIC 10/15/2009   Vitamin D deficiency 06/14/2009   SEBORRHEIC DERMATITIS 06/14/2009   LOW BACK PAIN 06/14/2009   Asthma exacerbation 04/19/2009   Acute upper respiratory infection 04/13/2009   Multiple sclerosis ([place]) 05/14/2008   Cough 05/14/2008   Past Medical History:  Diagnosis Date   Arthritis    Asthma    C. difficile diarrhea    Gallstone pancreatitis    Hernia    History of inguinal hernia repair    Low back pain    MS (multiple sclerosis) ([place])    Pancreatitis hosp[italized with gallstone pancreatitis 3/12[   Vitamin D deficiency     Family History  Problem Relation Age of Onset   Lung cancer Father        not related to smoking   Diabetes Mother    Colon cancer Neg Hx    Esophageal cancer Neg Hx    Pancreatic cancer Neg Hx    Stomach cancer Neg Hx    Liver disease Neg Hx      Past Surgical History:  Procedure Laterality Date   BIOPSY  09/02/2019   Procedure: BIOPSY;  Surgeon: Ladene Artist, MD;  Location: [facility];  Service: Endoscopy;;   CHOLECYSTECTOMY  04/2010   COLONOSCOPY WITH PROPOFOL N/A 09/02/2019   Procedure: COLONOSCOPY WITH PROPOFOL;  Surgeon: Ladene Artist, MD;  Location: [facility];  Service: Endoscopy;  Laterality: N/A;   CYSTECTOMY     on back and R hand   CYSTECTOMY     left arm   HERNIA REPAIR     x 2   INGUINAL HERNIA REPAIR  03/17/2011   Procedure: HERNIA REPAIR INGUINAL ADULT;  Surgeon: Adin Hector, MD;  Location: [facility];  Service: General;  Laterality: Left;  Open left inguinal hernia repair with mesh   KNEE ARTHROSCOPY     right   NOSE SURGERY     POLYPECTOMY  09/02/2019   Procedure:  POLYPECTOMY;  Surgeon: Ladene Artist, MD;  Location: [facility];  Service: Endoscopy;;   TONSILLECTOMY     WISDOM TOOTH EXTRACTION     Social History   Occupational History   Occupation: Health visitor: [employer]  Tobacco Use   Smoking status: Never   Smokeless tobacco: Never  Vaping Use   Vaping Use: Never used  Substance and Sexual Activity   Alcohol use: No   Drug use: No   Sexual activity: Yes

## 2021-09-26 ENCOUNTER — Other Ambulatory Visit: Payer: Self-pay | Admitting: Internal Medicine

## 2021-09-26 MED ORDER — TRAMADOL HCL (ER BIPHASIC) 200 MG PO CP24: 1.0000 | ORAL_CAPSULE | Freq: Every day | ORAL | 1 refills | Status: DC

## 2021-09-26 NOTE — Telephone Encounter (Unsigned)
Check [name] registry last filled tramadol (conzip) 05/20/2021.Marland KitchenJohny [name]

## 2021-10-07 ENCOUNTER — Ambulatory Visit (INDEPENDENT_AMBULATORY_CARE_PROVIDER_SITE_OTHER): Payer: Medicare Other | Admitting: Internal Medicine

## 2021-10-07 ENCOUNTER — Encounter: Payer: Self-pay | Admitting: Internal Medicine

## 2021-10-07 VITALS — BP 120/72 | HR 77 | Temp 98.4°F | Ht 67.0 in | Wt 164.0 lb

## 2021-10-07 DIAGNOSIS — R269 Unspecified abnormalities of gait and mobility: Secondary | ICD-10-CM | POA: Diagnosis not present

## 2021-10-07 DIAGNOSIS — M5440 Lumbago with sciatica, unspecified side: Secondary | ICD-10-CM

## 2021-10-07 DIAGNOSIS — N320 Bladder-neck obstruction: Secondary | ICD-10-CM | POA: Diagnosis not present

## 2021-10-07 DIAGNOSIS — G8929 Other chronic pain: Secondary | ICD-10-CM

## 2021-10-07 DIAGNOSIS — Z0000 Encounter for general adult medical examination without abnormal findings: Secondary | ICD-10-CM

## 2021-10-07 DIAGNOSIS — S46009A Unspecified injury of muscle(s) and tendon(s) of the rotator cuff of unspecified shoulder, initial encounter: Secondary | ICD-10-CM

## 2021-10-07 DIAGNOSIS — E785 Hyperlipidemia, unspecified: Secondary | ICD-10-CM

## 2021-10-07 DIAGNOSIS — G35 Multiple sclerosis: Secondary | ICD-10-CM

## 2021-10-07 MED ORDER — BACLOFEN 20 MG PO TABS: ORAL_TABLET | ORAL | 3 refills | Status: DC

## 2021-10-07 NOTE — Assessment & Plan Note (Signed)
Chronic  Cont on Tramadol ER Potential benefits of a long term Tramadol/opioids use as well as potential risks (i.e. addiction risk, apnea etc) and complications (i.e. Somnolence, constipation and others) were explained to the patient and were aknowledged. Cont on Baclofen

## 2021-10-07 NOTE — Assessment & Plan Note (Signed)
On Baclofen

## 2021-10-07 NOTE — Assessment & Plan Note (Signed)
F/u w/Dr Marlou Sa Blue-Emu cream -- use 2-3 times a day MRI pending

## 2021-10-07 NOTE — Progress Notes (Signed)
Subjective:  Patient ID: [name], male    DOB: [birth year]  Age: 66 y.o. MRN: [id number]  CC: [id number]   HPI Herbert Chalfinpresents for MS, LBP, R shoulder pain (MRI is pending), seeing Ortho - Dr DMarlou Sa Outpatient Medications Prior to Visit  Medication Sig Dispense Refill   amitriptyline (ELAVIL) 25 MG tablet TAKE 2 TABLETS AT BEDTIME AS NEEDED FOR SLEEP 180 tablet 3   cetirizine (ZYRTEC) 10 MG tablet Take 10 mg by mouth daily as needed for allergies.     cholecalciferol (VITAMIN D) 1000 units tablet Take 1,000 Units by mouth daily.     clotrimazole-betamethasone (LOTRISONE) cream APPLY 1 APPLICATION TOPICALLY TWICE A DAY AS NEEDED FOR IRRITATION 30 g 2   fluticasone (FLONASE) 50 MCG/ACT nasal spray Place 2 sprays into both nostrils daily as needed for allergies or rhinitis.     furosemide (LASIX) 20 MG tablet Take 1 tablet (20 mg total) by mouth daily. 30 tablet 3   gabapentin (NEURONTIN) 100 MG capsule TAKE 2 CAPSULES THREE TIMES A DAY AS NEEDED FOR PAIN 360 capsule 5   HYDROcodone bit-homatropine (HYCODAN) 5-1.5 MG/5ML syrup Take 5 mLs by mouth every 8 (eight) hours as needed for cough. 240 mL 0   levofloxacin (LEVAQUIN) 500 MG tablet Take 1 tablet (500 mg total) by mouth daily. 7 tablet 0   meloxicam (MOBIC) 7.5 MG tablet 1 tablet Orally Once a day     Multiple Vitamin (MULTIVITAMIN) capsule Take 1 tablet by mouth daily.     pantoprazole (PROTONIX) 40 MG tablet Take 1 tablet (40 mg total) by mouth daily. 90 tablet 3   Probiotic Product (ALIGN) 4 MG CAPS Take 1 capsule (4 mg total) by mouth daily. 90 capsule 3   tadalafil (CIALIS) 5 MG tablet Take 1 tablet (5 mg total) by mouth daily. 90 tablet 3   TraMADol HCl 200 MG CP24 Take 1 capsule by mouth daily. 90 capsule 1   baclofen (LIORESAL) 20 MG tablet TAKE 1 TABLET FOUR TIMES A DAY AS NEEDED FOR MUSCLE SPASMS (OFFICE VISIT NEEDED BEFORE REFILLS WILL BE GIVEN) 360 tablet 3   No facility-administered medications prior to  visit.    ROS: Review of Systems  Objective:  BP 120/72 (BP Location: Right Arm, Patient Position: Sitting, Cuff Size: Large)   Pulse 77   Temp 98.4 F (36.9 C) (Oral)   Ht '5\' 7"'$  (1.702 m)   Wt 164 lb (74.4 kg)   SpO2 96%   BMI 25.69 kg/m   BP Readings from Last 3 Encounters:  10/07/21 120/72  09/07/21 136/84  06/09/21 130/82    Wt Readings from Last 3 Encounters:  10/07/21 164 lb (74.4 kg)  09/07/21 168 lb 6 oz (76.4 kg)  06/09/21 164 lb (74.4 kg)    Physical Exam  Lab Results  Component Value Date   WBC 5.5 04/01/2021   HGB 14.8 04/01/2021   HCT 44.8 04/01/2021   PLT 171.0 04/01/2021   GLUCOSE 99 04/01/2021   CHOL 191 04/01/2021   TRIG 107.0 04/01/2021   HDL 49.80 04/01/2021   LDLDIRECT 118.4 06/09/2009   LDLCALC 120 (H) 04/01/2021   ALT 14 04/01/2021   AST 15 04/01/2021   NA 140 04/01/2021   K 4.5 04/01/2021   CL 104 04/01/2021   CREATININE 0.85 04/01/2021   BUN 15 04/01/2021   CO2 35 (H) 04/01/2021   TSH 4.80 04/01/2021   PSA 2.74 04/01/2021   INR 1.0 08/24/2008  HGBA1C 5.8 07/11/2019    CT ABDOMEN PELVIS W CONTRAST  Result Date: 08/29/2019 CLINICAL DATA:  Diarrhea for 3 months EXAM: CT ABDOMEN AND PELVIS WITH CONTRAST TECHNIQUE: Multidetector CT imaging of the abdomen and pelvis was performed using the standard protocol following bolus administration of intravenous contrast. CONTRAST:  180m OMNIPAQUE IOHEXOL 300 MG/ML  SOLN COMPARISON:  01/31/2018 FINDINGS: Lower chest: No acute pleural or parenchymal lung disease. Hepatobiliary: Gallbladder is surgically absent. Left lobe of the liver is atrophic, a chronic finding. Remainder of the liver is unremarkable. Pancreas: Unremarkable. No pancreatic ductal dilatation or surrounding inflammatory changes. Spleen: Normal in size without focal abnormality. Adrenals/Urinary Tract: Stable right renal cysts. Kidneys otherwise unremarkable. Bladder is moderately distended with no focal abnormality. The adrenals are  normal. Stomach/Bowel: No bowel obstruction or ileus. There is mild wall thickening of the descending colon from the splenic flexure through the rectosigmoid junction, with mild pericolonic fat stranding. Findings are consistent with inflammatory or infectious colitis. Vascular/Lymphatic: No significant vascular findings are present. No enlarged abdominal or pelvic lymph nodes. Reproductive: Mild enlargement of the prostate, stable. Other: No abdominal wall hernia or abnormality. No abdominopelvic ascites. Musculoskeletal: No acute or destructive bony lesions. Reconstructed images demonstrate no additional findings. IMPRESSION: 1. Inflammatory or infectious colitis involving the descending colon. 2. Stable mild enlargement of the prostate. Electronically Signed   By: MRanda NgoM.D.   On: 08/29/2019 18:58    Assessment & Plan:   Problem List Items Addressed This Visit     LOW BACK PAIN    Chronic  Cont on Tramadol ER Potential benefits of a long term Tramadol/opioids use as well as potential risks (i.e. addiction risk, apnea etc) and complications (i.e. Somnolence, constipation and others) were explained to the patient and were aknowledged. Cont on Baclofen      Relevant Medications   baclofen (LIORESAL) 20 MG tablet   Multiple sclerosis ([facility])    On Baclofen      Rotator cuff injury, initial encounter    F/u w/Dr DMarlou SaBlue-Emu cream -- use 2-3 times a day MRI pending         Meds ordered this encounter  Medications   baclofen (LIORESAL) 20 MG tablet    Sig: TAKE 1 TABLET FOUR TIMES A DAY AS NEEDED FOR MUSCLE SPASMS    Dispense:  360 tablet    Refill:  3      Follow-up: No follow-ups on file.  AWalker Kehr MD

## 2021-10-07 NOTE — Assessment & Plan Note (Signed)
[employer]

## 2021-10-07 NOTE — Patient Instructions (Signed)
Blue-Emu cream -- use 2-3 times a day ? ?

## 2021-10-13 ENCOUNTER — Ambulatory Visit: Payer: Medicare Other | Admitting: Internal Medicine

## 2021-10-14 ENCOUNTER — Ambulatory Visit
Admission: RE | Admit: 2021-10-14 | Discharge: 2021-10-14 | Disposition: A | Discharge status: Home / Self Care | Payer: Medicare Other | Source: Ambulatory Visit | Attending: Orthopedic Surgery | Admitting: Orthopedic Surgery

## 2021-10-14 DIAGNOSIS — M7541 Impingement syndrome of right shoulder: Secondary | ICD-10-CM

## 2021-10-14 DIAGNOSIS — S43431A Superior glenoid labrum lesion of right shoulder, initial encounter: Secondary | ICD-10-CM | POA: Diagnosis not present

## 2021-10-14 MED ORDER — IOPAMIDOL (ISOVUE-M 200) INJECTION 41%
12.0000 mL | Freq: Once | INTRAMUSCULAR | Status: AC
  Administered 2021-10-14: 12 mL via INTRA_ARTICULAR

## 2021-10-19 ENCOUNTER — Other Ambulatory Visit: Payer: Self-pay | Admitting: Internal Medicine

## 2021-10-24 ENCOUNTER — Ambulatory Visit (INDEPENDENT_AMBULATORY_CARE_PROVIDER_SITE_OTHER): Payer: Medicare Other | Admitting: Surgical

## 2021-10-24 DIAGNOSIS — S43431A Superior glenoid labrum lesion of right shoulder, initial encounter: Secondary | ICD-10-CM | POA: Diagnosis not present

## 2021-10-24 DIAGNOSIS — M7521 Bicipital tendinitis, right shoulder: Secondary | ICD-10-CM | POA: Diagnosis not present

## 2021-10-25 ENCOUNTER — Encounter: Payer: Self-pay | Admitting: Orthopedic Surgery

## 2021-10-25 ENCOUNTER — Other Ambulatory Visit: Payer: Self-pay | Admitting: Internal Medicine

## 2021-10-25 NOTE — Progress Notes (Signed)
Office Visit Note   Patient: [name]           Date of Birth: Oct 08, 1955           MRN: [id number] Visit Date: 10/24/2021 Requested by: Cassandria Anger, MD [place],  [place] [address] PCP: Plotnikov, Evie Lacks, MD  Subjective: Chief Complaint  Patient presents with   Other    Scan review    HPI: [name] is a 66 y.o. male who presents to the office for MRI review.  Continues to complain mostly of anterior shoulder pain in the right shoulder with occasional shifting sensation that he notices about every other day.  Overall his pain does seem improved compared with last visit and is not currently bothering him enough for any surgical intervention.  He is able to sleep on his right shoulder now.  He has not been doing any exercise band or weight lifting work and has not been pulling himself up by his right arm like he normally does for getting around due to his history of MS.  MRI results revealed: MR SHOULDER RIGHT W CONTRAST  Result Date: 10/17/2021 CLINICAL DATA:  MR arthrogram right shoulder eval RTC tear and possible biceps tendon subluxation EXAM: MRI OF THE RIGHT SHOULDER WITH CONTRAST TECHNIQUE: Multiplanar, multisequence MR imaging of the right shoulder was performed following the administration of intra-articular contrast. CONTRAST:  See Injection Documentation. COMPARISON:  Radiograph 09/07/2021 FINDINGS: Rotator cuff: There is moderate distal supraspinatus and infraspinatus tendinosis with bursal sided fraying. Teres minor tendon is intact. Subscapularis tendon is intact. Muscles: No significant muscle atrophy. Biceps Long Head: Intraarticular and extraarticular portions of the biceps tendon are intact. Acromioclavicular Joint: Mild arthropathy of the acromioclavicular joint. Small amount of subacromial/subdeltoid bursal fluid. Glenohumeral Joint: Adequate distension by arthrogram technique. Mild chondrosis. Labrum: Posterosuperior labral tear from 10  o'clock-12 o'clock. Bones: No evidence of acute fracture.  No aggressive osseous lesion. Other: No fluid collection or hematoma. IMPRESSION: Moderate distal supraspinatus and infraspinatus tendinosis with bursal sided fraying. No high-grade or retracted cuff tear. Posterosuperior labral tear from 10:00-12:00. Mild glenohumeral and AC joint osteoarthritis. Intact biceps tendon. Electronically Signed   By: Maurine Simmering M.D.   On: 10/17/2021 12:58                 ROS: All systems reviewed are negative as they relate to the chief complaint within the history of present illness.  Patient denies fevers or chills.  Assessment & Plan: Visit Diagnoses:  1. Labral tear of shoulder, right, initial encounter   2. Biceps tendinitis of right shoulder     Plan: [name] is a 66 y.o. male who presents to the office for review of right shoulder MRI.  MRI demonstrates posterior/superior labral tear with some fraying of the insertion of the supra and infra.  Bicep tendon appears intact.  No injury to the subscap.  Reviewed the images with the patient today and discussed the options.  Main options would be doing nothing and trying to ease back into his normal activities versus injection versus pursuing surgical intervention.  Based on his symptoms, do not really think surgery is the right first step and he agreed.  He would like to try easing back into his normal activity level and some of his exercising to see how his shoulder pain will respond.  If he has persistent or worsening pain, he will call the office and the next step would be trying an injection.  Would try ultrasound-guided glenohumeral injection first and if that does not help significantly, could consider injection into the bicep tendon sheath.  Patient agreed with plan.  Follow-up with the office as needed if pain does not improve.  Follow-Up Instructions: No follow-ups on file.   Orders:  No orders of the defined types were placed in this  encounter.  No orders of the defined types were placed in this encounter.     Procedures: No procedures performed   Clinical Data: No additional findings.  Objective: Vital Signs: There were no vitals taken for this visit.  Physical Exam:  Constitutional: Patient appears well-developed HEENT:  Head: Normocephalic Eyes:EOM are normal Neck: Normal range of motion Cardiovascular: Normal rate Pulmonary/chest: Effort normal Neurologic: Patient is alert Skin: Skin is warm Psychiatric: Patient has normal mood and affect  Ortho Exam: Ortho exam demonstrates right shoulder with 45 degrees external rotation, 90 degrees abduction, 135 degrees forward flexion.  Excellent rotator cuff strength of supra, infra, subscap rated 5/5.  No sensation of bicep tendon subluxation felt with passive external/internal rotation of the right shoulder.  Does have moderate tenderness overlying the bicep tendon in the bicipital groove.  No tenderness over the [facility] joint.  Negative bearhug test.  Specialty Comments:  No specialty comments available.  Imaging: No results found.   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   Hemangioma of skin and subcutaneous tissue 06/09/2021   Muscle weakness (generalized) 06/09/2021   Other reduced mobility 06/09/2021   Other seborrheic keratosis 06/09/2021   Blister 07/06/2020   Allergic rhinitis 05/26/2020   Constipation due to slow transit 02/05/2020   History of Clostridium difficile colitis 11/12/2019   Stool incontinence 09/10/2019   Urgency incontinence 09/10/2019   Infectious colitis    Abnormal CT scan, colon    Benign neoplasm of cecum    Hematochezia    Peripheral neuropathy 08/31/2019   Chronic pancreatitis ([place]) 08/31/2019    Acute colitis 08/29/2019   Acute bronchitis 02/27/2018   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[place]) 11/09/2017   Influenza A    Viral gastroenteritis 03/20/2016   Nausea & vomiting 03/20/2016   Asthma 03/20/2016   Leukocytosis 03/20/2016   Intractable nausea and vomiting 03/20/2016   GERD (gastroesophageal reflux disease) 09/25/2014   Dysphagia 05/15/2014   Pharyngeal dysphagia 05/15/2014   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   Spells 04/27/2014   Wrist pain, right 09/11/2011   Left inguinal hernia 02/16/2011   Paresthesia 01/24/2011   Well adult exam 07/20/2010   GALLSTONE PANCREATITIS 04/15/2010   Diarrhea 04/15/2010   FREQUENCY, URINARY 04/15/2010   Abnormality of gait 04/05/2010   Headache 04/05/2010   INSOMNIA, CHRONIC 10/15/2009   Vitamin D deficiency 06/14/2009   SEBORRHEIC DERMATITIS 06/14/2009   LOW BACK PAIN 06/14/2009   Asthma exacerbation 04/19/2009   Acute upper respiratory infection 04/13/2009   Multiple sclerosis ([place]) 05/14/2008   Cough 05/14/2008   Past Medical History:  Diagnosis Date   Arthritis    Asthma    C. difficile diarrhea    Gallstone pancreatitis    Hernia    History of inguinal hernia repair    Low back pain    MS (multiple sclerosis) ([place])    Pancreatitis hosp[italized with gallstone pancreatitis 3/12[   Vitamin D deficiency  Family History  Problem Relation Age of Onset   Lung cancer Father        not related to smoking   Diabetes Mother    Colon cancer Neg Hx    Esophageal cancer Neg Hx    Pancreatic cancer Neg Hx    Stomach cancer Neg Hx    Liver disease Neg Hx     Past Surgical History:  Procedure Laterality Date   BIOPSY  09/02/2019   Procedure: BIOPSY;  Surgeon: Ladene Artist, MD;  Location: [facility];  Service: Endoscopy;;   CHOLECYSTECTOMY  04/2010   COLONOSCOPY WITH PROPOFOL N/A 09/02/2019   Procedure: COLONOSCOPY WITH PROPOFOL;  Surgeon: Ladene Artist, MD;  Location: [facility];  Service:  Endoscopy;  Laterality: N/A;   CYSTECTOMY     on back and R hand   CYSTECTOMY     left arm   HERNIA REPAIR     x 2   INGUINAL HERNIA REPAIR  03/17/2011   Procedure: HERNIA REPAIR INGUINAL ADULT;  Surgeon: Adin Hector, MD;  Location: [facility];  Service: General;  Laterality: Left;  Open left inguinal hernia repair with mesh   KNEE ARTHROSCOPY     right   NOSE SURGERY     POLYPECTOMY  09/02/2019   Procedure: POLYPECTOMY;  Surgeon: Ladene Artist, MD;  Location: [facility];  Service: Endoscopy;;   TONSILLECTOMY     WISDOM TOOTH EXTRACTION     Social History   Occupational History   Occupation: Health visitor: [employer]  Tobacco Use   Smoking status: Never   Smokeless tobacco: Never  Vaping Use   Vaping Use: Never used  Substance and Sexual Activity   Alcohol use: No   Drug use: No   Sexual activity: Yes

## 2022-01-03 ENCOUNTER — Other Ambulatory Visit: Payer: Self-pay | Admitting: Internal Medicine

## 2022-03-15 DIAGNOSIS — D1801 Hemangioma of skin and subcutaneous tissue: Secondary | ICD-10-CM | POA: Diagnosis not present

## 2022-03-15 DIAGNOSIS — L570 Actinic keratosis: Secondary | ICD-10-CM | POA: Diagnosis not present

## 2022-03-15 DIAGNOSIS — L821 Other seborrheic keratosis: Secondary | ICD-10-CM | POA: Diagnosis not present

## 2022-03-15 DIAGNOSIS — Z85828 Personal history of other malignant neoplasm of skin: Secondary | ICD-10-CM | POA: Diagnosis not present

## 2022-03-15 DIAGNOSIS — L814 Other melanin hyperpigmentation: Secondary | ICD-10-CM | POA: Diagnosis not present

## 2022-03-15 DIAGNOSIS — D225 Melanocytic nevi of trunk: Secondary | ICD-10-CM | POA: Diagnosis not present

## 2022-03-15 DIAGNOSIS — D0462 Carcinoma in situ of skin of left upper limb, including shoulder: Secondary | ICD-10-CM | POA: Diagnosis not present

## 2022-03-24 ENCOUNTER — Encounter: Payer: Self-pay | Admitting: Internal Medicine

## 2022-03-27 ENCOUNTER — Telehealth: Payer: Self-pay

## 2022-03-27 NOTE — Telephone Encounter (Signed)
Called patient to schedule Medicare Annual Wellness Visit (AWV). No voicemail available to leave a message.  Last date of AWV: 04/06/21  Please schedule an appointment at any time with [facility].  If any questions, please contact me at [phone].  Thank you ,  P.J. Quentin Cornwall, CMA ([place]

## 2022-03-31 ENCOUNTER — Ambulatory Visit (INDEPENDENT_AMBULATORY_CARE_PROVIDER_SITE_OTHER): Payer: Medicare Other | Admitting: Nurse Practitioner

## 2022-03-31 VITALS — BP 110/72 | HR 95 | Temp 98.5°F | Ht 67.0 in

## 2022-03-31 DIAGNOSIS — J019 Acute sinusitis, unspecified: Secondary | ICD-10-CM | POA: Diagnosis not present

## 2022-03-31 MED ORDER — DOXYCYCLINE HYCLATE 100 MG PO TABS: 100.0000 mg | ORAL_TABLET | Freq: Two times a day (BID) | ORAL | 0 refills | Status: DC

## 2022-03-31 NOTE — Progress Notes (Signed)
   Established Patient Office Visit  Subjective   Patient ID: [name], male    DOB: 01-01-1956  Age: 67 y.o. MRN: [id number]  Chief Complaint  Patient presents with   Sinusitis    Sinus symptoms started 1 week ago, has been getting progressively worse.  Has frequent sinus infections usually improves with Mucinex and Tylenol.  Nasal congestion, cough, sputum production, headache.  No shortness of breath.  Reports last time treated with azithromycin she did not experience complete resolution of symptoms with this.  Also reports history of GI upset with Augmentin, has history of C. difficile.     Review of Systems  Constitutional:  Negative for fever.  HENT:  Positive for congestion.   Respiratory:  Positive for cough and sputum production. Negative for shortness of breath.   Gastrointestinal:  Negative for constipation, diarrhea, nausea and vomiting.  Neurological:  Positive for headaches.      Objective:     BP 110/72   Pulse 95   Temp 98.5 F (36.9 C) (Temporal)   Ht 5' 7"$  (1.702 m)   SpO2 98%   BMI 25.69 kg/m    Physical Exam Vitals reviewed.  Constitutional:      Appearance: Normal appearance.  HENT:     Head: Normocephalic and atraumatic.     Right Ear: Hearing, ear canal and external ear normal. There is impacted cerumen.     Left Ear: Hearing, ear canal and external ear normal. There is impacted cerumen.  Cardiovascular:     Rate and Rhythm: Normal rate and regular rhythm.  Pulmonary:     Effort: Pulmonary effort is normal.     Breath sounds: Normal breath sounds.  Musculoskeletal:     Cervical back: Neck supple.  Lymphadenopathy:     Cervical: No cervical adenopathy.  Skin:    General: Skin is warm and dry.  Neurological:     Mental Status: He is alert and oriented to person, place, and time.  Psychiatric:        Mood and Affect: Mood normal.        Behavior: Behavior normal.        Thought Content: Thought content normal.        Judgment:  Judgment normal.      No results found for any visits on 03/31/22.    The 10-year ASCVD risk score (Arnett DK, et al., 2019) is: 11.2%    Assessment & Plan:   Problem List Items Addressed This Visit       Respiratory   Acute sinusitis - Primary    Acute, treat with course of doxycycline twice daily x 7 days.  Patient to notify office if symptoms persist or worsen.  Also continues Mucinex and Tylenol as needed.      Relevant Medications   doxycycline (VIBRA-TABS) 100 MG tablet    Return if symptoms worsen or fail to improve.    Ailene Ards, NP

## 2022-03-31 NOTE — Assessment & Plan Note (Signed)
Acute, treat with course of doxycycline twice daily x 7 days.  Patient to notify office if symptoms persist or worsen.  Also continues Mucinex and Tylenol as needed.

## 2022-04-04 ENCOUNTER — Other Ambulatory Visit (INDEPENDENT_AMBULATORY_CARE_PROVIDER_SITE_OTHER): Payer: Medicare Other

## 2022-04-04 DIAGNOSIS — G35 Multiple sclerosis: Secondary | ICD-10-CM

## 2022-04-04 DIAGNOSIS — E785 Hyperlipidemia, unspecified: Secondary | ICD-10-CM | POA: Diagnosis not present

## 2022-04-04 DIAGNOSIS — Z0000 Encounter for general adult medical examination without abnormal findings: Secondary | ICD-10-CM

## 2022-04-04 DIAGNOSIS — M5440 Lumbago with sciatica, unspecified side: Secondary | ICD-10-CM | POA: Diagnosis not present

## 2022-04-04 DIAGNOSIS — G8929 Other chronic pain: Secondary | ICD-10-CM

## 2022-04-04 DIAGNOSIS — N320 Bladder-neck obstruction: Secondary | ICD-10-CM

## 2022-04-04 DIAGNOSIS — S46009A Unspecified injury of muscle(s) and tendon(s) of the rotator cuff of unspecified shoulder, initial encounter: Secondary | ICD-10-CM

## 2022-04-04 LAB — LIPID PANEL
Cholesterol: 139 mg/dL (ref 0–200)
HDL: 40.7 mg/dL (ref >39.00)
LDL Cholesterol: 77 mg/dL (ref 0–99)
NonHDL: 98.14
Total CHOL/HDL Ratio: 3
Triglycerides: 105 mg/dL (ref 0.0–149.0)
VLDL: 21 mg/dL (ref 0.0–40.0)

## 2022-04-04 LAB — CBC WITH DIFFERENTIAL/PLATELET
Basophils Absolute: 0 10*3/uL (ref 0.0–0.1)
Basophils Relative: 0.5 % (ref 0.0–3.0)
Eosinophils Absolute: 0.2 10*3/uL (ref 0.0–0.7)
Eosinophils Relative: 2.6 % (ref 0.0–5.0)
HCT: 44.1 % (ref 39.0–52.0)
Hemoglobin: 14.7 g/dL (ref 13.0–17.0)
Lymphocytes Relative: 23.6 % (ref 12.0–46.0)
Lymphs Abs: 1.4 10*3/uL (ref 0.7–4.0)
MCHC: 33.4 g/dL (ref 30.0–36.0)
MCV: 93.9 fl (ref 78.0–100.0)
Monocytes Absolute: 0.5 10*3/uL (ref 0.1–1.0)
Monocytes Relative: 8.7 % (ref 3.0–12.0)
Neutro Abs: 3.9 10*3/uL (ref 1.4–7.7)
Neutrophils Relative %: 64.6 % (ref 43.0–77.0)
Platelets: 180 10*3/uL (ref 150.0–400.0)
RBC: 4.69 Mil/uL (ref 4.22–5.81)
RDW: 12.5 % (ref 11.5–15.5)
WBC: 6.1 10*3/uL (ref 4.0–10.5)

## 2022-04-04 LAB — COMPREHENSIVE METABOLIC PANEL
ALT: 18 U/L (ref 0–53)
AST: 17 U/L (ref 0–37)
Albumin: 3.8 g/dL (ref 3.5–5.2)
Alkaline Phosphatase: 80 U/L (ref 39–117)
BUN: 18 mg/dL (ref 6–23)
CO2: 31 mEq/L (ref 19–32)
Calcium: 9 mg/dL (ref 8.4–10.5)
Chloride: 103 mEq/L (ref 96–112)
Creatinine, Ser: 0.8 mg/dL (ref 0.40–1.50)
GFR: 91.79 mL/min (ref >60.00)
Glucose, Bld: 97 mg/dL (ref 70–99)
Potassium: 4.4 mEq/L (ref 3.5–5.1)
Sodium: 140 mEq/L (ref 135–145)
Total Bilirubin: 0.6 mg/dL (ref 0.2–1.2)
Total Protein: 6.4 g/dL (ref 6.0–8.3)

## 2022-04-04 LAB — TSH: TSH: 3.15 u[IU]/mL (ref 0.35–5.50)

## 2022-04-04 LAB — URINALYSIS
Bilirubin Urine: NEGATIVE
Ketones, ur: NEGATIVE
Leukocytes,Ua: NEGATIVE
Nitrite: NEGATIVE
Specific Gravity, Urine: 1.01 (ref 1.000–1.030)
Total Protein, Urine: NEGATIVE
Urine Glucose: NEGATIVE
Urobilinogen, UA: 0.2 (ref 0.0–1.0)
pH: 7 (ref 5.0–8.0)

## 2022-04-04 LAB — PSA: PSA: 3.37 ng/mL (ref 0.10–4.00)

## 2022-04-10 ENCOUNTER — Ambulatory Visit: Payer: Medicare Other | Admitting: Internal Medicine

## 2022-04-13 DIAGNOSIS — G35 Multiple sclerosis: Secondary | ICD-10-CM | POA: Diagnosis not present

## 2022-04-24 DIAGNOSIS — H0102B Squamous blepharitis left eye, upper and lower eyelids: Secondary | ICD-10-CM | POA: Diagnosis not present

## 2022-04-24 DIAGNOSIS — H4720 Unspecified optic atrophy: Secondary | ICD-10-CM | POA: Diagnosis not present

## 2022-04-24 DIAGNOSIS — H0102A Squamous blepharitis right eye, upper and lower eyelids: Secondary | ICD-10-CM | POA: Diagnosis not present

## 2022-04-24 DIAGNOSIS — H0288B Meibomian gland dysfunction left eye, upper and lower eyelids: Secondary | ICD-10-CM | POA: Diagnosis not present

## 2022-04-24 DIAGNOSIS — H0288A Meibomian gland dysfunction right eye, upper and lower eyelids: Secondary | ICD-10-CM | POA: Diagnosis not present

## 2022-04-24 DIAGNOSIS — H43811 Vitreous degeneration, right eye: Secondary | ICD-10-CM | POA: Diagnosis not present

## 2022-04-24 DIAGNOSIS — H2513 Age-related nuclear cataract, bilateral: Secondary | ICD-10-CM | POA: Diagnosis not present

## 2022-05-01 ENCOUNTER — Encounter: Payer: Self-pay | Admitting: Internal Medicine

## 2022-05-01 ENCOUNTER — Ambulatory Visit (INDEPENDENT_AMBULATORY_CARE_PROVIDER_SITE_OTHER): Payer: Medicare Other | Admitting: Internal Medicine

## 2022-05-01 VITALS — BP 110/76 | HR 76 | Temp 98.2°F | Ht 67.0 in | Wt 174.0 lb

## 2022-05-01 DIAGNOSIS — K219 Gastro-esophageal reflux disease without esophagitis: Secondary | ICD-10-CM | POA: Diagnosis not present

## 2022-05-01 DIAGNOSIS — M6281 Muscle weakness (generalized): Secondary | ICD-10-CM

## 2022-05-01 DIAGNOSIS — M5440 Lumbago with sciatica, unspecified side: Secondary | ICD-10-CM

## 2022-05-01 DIAGNOSIS — E559 Vitamin D deficiency, unspecified: Secondary | ICD-10-CM | POA: Diagnosis not present

## 2022-05-01 DIAGNOSIS — G8220 Paraplegia, unspecified: Secondary | ICD-10-CM

## 2022-05-01 DIAGNOSIS — G35 Multiple sclerosis: Secondary | ICD-10-CM | POA: Diagnosis not present

## 2022-05-01 DIAGNOSIS — E785 Hyperlipidemia, unspecified: Secondary | ICD-10-CM | POA: Diagnosis not present

## 2022-05-01 DIAGNOSIS — G8929 Other chronic pain: Secondary | ICD-10-CM

## 2022-05-01 DIAGNOSIS — G6189 Other inflammatory polyneuropathies: Secondary | ICD-10-CM

## 2022-05-01 DIAGNOSIS — D72829 Elevated white blood cell count, unspecified: Secondary | ICD-10-CM | POA: Diagnosis not present

## 2022-05-01 DIAGNOSIS — R519 Headache, unspecified: Secondary | ICD-10-CM | POA: Diagnosis not present

## 2022-05-01 LAB — CBC WITH DIFFERENTIAL/PLATELET
Basophils Absolute: 0 10*3/uL (ref 0.0–0.1)
Basophils Relative: 0.9 % (ref 0.0–3.0)
Eosinophils Absolute: 0.1 10*3/uL (ref 0.0–0.7)
Eosinophils Relative: 1.3 % (ref 0.0–5.0)
HCT: 45.2 % (ref 39.0–52.0)
Hemoglobin: 15.3 g/dL (ref 13.0–17.0)
Lymphocytes Relative: 27 % (ref 12.0–46.0)
Lymphs Abs: 1.5 10*3/uL (ref 0.7–4.0)
MCHC: 33.9 g/dL (ref 30.0–36.0)
MCV: 93.9 fl (ref 78.0–100.0)
Monocytes Absolute: 0.5 10*3/uL (ref 0.1–1.0)
Monocytes Relative: 9.8 % (ref 3.0–12.0)
Neutro Abs: 3.3 10*3/uL (ref 1.4–7.7)
Neutrophils Relative %: 61 % (ref 43.0–77.0)
Platelets: 172 10*3/uL (ref 150.0–400.0)
RBC: 4.82 Mil/uL (ref 4.22–5.81)
RDW: 12.6 % (ref 11.5–15.5)
WBC: 5.4 10*3/uL (ref 4.0–10.5)

## 2022-05-01 LAB — COMPREHENSIVE METABOLIC PANEL
ALT: 19 U/L (ref 0–53)
AST: 17 U/L (ref 0–37)
Albumin: 4.3 g/dL (ref 3.5–5.2)
Alkaline Phosphatase: 70 U/L (ref 39–117)
BUN: 13 mg/dL (ref 6–23)
CO2: 28 mEq/L (ref 19–32)
Calcium: 9.2 mg/dL (ref 8.4–10.5)
Chloride: 105 mEq/L (ref 96–112)
Creatinine, Ser: 0.86 mg/dL (ref 0.40–1.50)
GFR: 89.76 mL/min (ref >60.00)
Glucose, Bld: 101 mg/dL — ABNORMAL HIGH (ref 70–99)
Potassium: 4.2 mEq/L (ref 3.5–5.1)
Sodium: 141 mEq/L (ref 135–145)
Total Bilirubin: 0.5 mg/dL (ref 0.2–1.2)
Total Protein: 6.9 g/dL (ref 6.0–8.3)

## 2022-05-01 MED ORDER — TRAMADOL HCL (ER BIPHASIC) 200 MG PO CP24: 1.0000 | ORAL_CAPSULE | Freq: Every day | ORAL | 1 refills | Status: DC

## 2022-05-01 NOTE — Assessment & Plan Note (Signed)
On Vit D 

## 2022-05-01 NOTE — Patient Instructions (Signed)

## 2022-05-01 NOTE — Assessment & Plan Note (Signed)
Monitor CBC 

## 2022-05-01 NOTE — Assessment & Plan Note (Signed)
Coronary calcium CT ordered 

## 2022-05-01 NOTE — Assessment & Plan Note (Signed)
On Tramadol ER

## 2022-05-01 NOTE — Assessment & Plan Note (Signed)
Cont on Tramadol ER ?Potential benefits of a long term Tramadol/opioids use as well as potential risks (i.e. addiction risk, apnea etc) and complications (i.e. Somnolence, constipation and others) were explained to the patient and were aknowledged. ?Cont on Baclofen ?

## 2022-05-01 NOTE — Assessment & Plan Note (Signed)
Check testosterone due to weakness

## 2022-05-01 NOTE — Progress Notes (Signed)
Subjective:  Patient ID: [name], male    DOB: Jul 07, 1955  Age: 67 y.o. MRN: [id number]  CC: Follow-up (6 mnth f/u)   HPI [name] presents for a 6 months f/u  F/u HAs, chronic pain, MS C/o weakness, pain  Outpatient Medications Prior to Visit  Medication Sig Dispense Refill   amitriptyline (ELAVIL) 25 MG tablet TAKE 2 TABLETS AT BEDTIME AS NEEDED FOR SLEEP 180 tablet 3   baclofen (LIORESAL) 20 MG tablet TAKE 1 TABLET FOUR TIMES A DAY AS NEEDED FOR MUSCLE SPASMS 360 tablet 3   cetirizine (ZYRTEC) 10 MG tablet Take 10 mg by mouth daily as needed for allergies.     cholecalciferol (VITAMIN D) 1000 units tablet Take 1,000 Units by mouth daily.     clotrimazole-betamethasone (LOTRISONE) cream APPLY 1 APPLICATION TOPICALLY TWICE A DAY AS NEEDED FOR IRRITATION 30 g 2   fluticasone (FLONASE) 50 MCG/ACT nasal spray Place 2 sprays into both nostrils daily as needed for allergies or rhinitis.     furosemide (LASIX) 20 MG tablet Take 1 tablet (20 mg total) by mouth daily. 30 tablet 3   gabapentin (NEURONTIN) 100 MG capsule TAKE 2 CAPSULES THREE TIMES A DAY AS NEEDED FOR PAIN 360 capsule 5   HYDROcodone bit-homatropine (HYCODAN) 5-1.5 MG/5ML syrup Take 5 mLs by mouth every 8 (eight) hours as needed for cough. 240 mL 0   levofloxacin (LEVAQUIN) 500 MG tablet Take 1 tablet (500 mg total) by mouth daily. 7 tablet 0   meloxicam (MOBIC) 7.5 MG tablet 1 tablet Orally Once a day     Multiple Vitamin (MULTIVITAMIN) capsule Take 1 tablet by mouth daily.     pantoprazole (PROTONIX) 40 MG tablet TAKE 1 TABLET DAILY 90 tablet 3   Probiotic Product (ALIGN) 4 MG CAPS Take 1 capsule (4 mg total) by mouth daily. 90 capsule 3   tadalafil (CIALIS) 5 MG tablet TAKE 1 TABLET DAILY 90 tablet 2   TraMADol HCl 200 MG CP24 Take 1 capsule by mouth daily. 90 capsule 1   doxycycline (VIBRA-TABS) 100 MG tablet Take 1 tablet (100 mg total) by mouth 2 (two) times daily. 14 tablet 0   No facility-administered  medications prior to visit.    ROS: Review of Systems  Constitutional:  Negative for appetite change, fatigue and unexpected weight change.  HENT:  Negative for congestion, nosebleeds, sneezing, sore throat and trouble swallowing.   Eyes:  Negative for itching and visual disturbance.  Respiratory:  Negative for cough.   Cardiovascular:  Negative for chest pain, palpitations and leg swelling.  Gastrointestinal:  Negative for abdominal distention, blood in stool, diarrhea and nausea.  Genitourinary:  Negative for frequency and hematuria.  Musculoskeletal:  Positive for arthralgias, back pain and gait problem. Negative for joint swelling and neck pain.  Skin:  Negative for rash.  Neurological:  Positive for headaches. Negative for dizziness, tremors, speech difficulty and weakness.  Psychiatric/Behavioral:  Negative for agitation, dysphoric mood, sleep disturbance and suicidal ideas. The patient is nervous/anxious.     Objective:  BP 110/76 (BP Location: Right Arm, Patient Position: Sitting, Cuff Size: Large)   Pulse 76   Temp 98.2 F (36.8 C) (Oral)   Ht 5\' 7"  (1.702 m)   Wt 174 lb (78.9 kg)   SpO2 95%   BMI 27.25 kg/m   BP Readings from Last 3 Encounters:  05/01/22 110/76  03/31/22 110/72  10/07/21 120/72    Wt Readings from Last 3 Encounters:  05/01/22 174 lb (  78.9 kg)  10/07/21 164 lb (74.4 kg)  09/07/21 168 lb 6 oz (76.4 kg)    Physical Exam Constitutional:      General: He is not in acute distress.    Appearance: Normal appearance. He is well-developed.     Comments: NAD  Eyes:     Conjunctiva/sclera: Conjunctivae normal.     Pupils: Pupils are equal, round, and reactive to light.  Neck:     Thyroid: No thyromegaly.     Vascular: No JVD.  Cardiovascular:     Rate and Rhythm: Normal rate and regular rhythm.     Heart sounds: Normal heart sounds. No murmur heard.    No friction rub. No gallop.  Pulmonary:     Effort: Pulmonary effort is normal. No respiratory  distress.     Breath sounds: Normal breath sounds. No wheezing or rales.  Chest:     Chest wall: No tenderness.  Abdominal:     General: Bowel sounds are normal. There is no distension.     Palpations: Abdomen is soft. There is no mass.     Tenderness: There is no abdominal tenderness. There is no guarding or rebound.  Musculoskeletal:        General: No tenderness. Normal range of motion.     Cervical back: Normal range of motion.  Lymphadenopathy:     Cervical: No cervical adenopathy.  Skin:    General: Skin is warm and dry.     Findings: No rash.  Neurological:     Mental Status: He is alert and oriented to person, place, and time.     Cranial Nerves: No cranial nerve deficit.     Motor: No abnormal muscle tone.     Coordination: Coordination normal.     Gait: Gait normal.     Deep Tendon Reflexes: Reflexes are normal and symmetric.  Psychiatric:        Behavior: Behavior normal.        Thought Content: Thought content normal.        Judgment: Judgment normal.   Ataxic gait Weak legs - worse  Lab Results  Component Value Date   WBC 6.1 04/04/2022   HGB 14.7 04/04/2022   HCT 44.1 04/04/2022   PLT 180.0 04/04/2022   GLUCOSE 97 04/04/2022   CHOL 139 04/04/2022   TRIG 105.0 04/04/2022   HDL 40.70 04/04/2022   LDLDIRECT 118.4 06/09/2009   LDLCALC 77 04/04/2022   ALT 18 04/04/2022   AST 17 04/04/2022   NA 140 04/04/2022   K 4.4 04/04/2022   CL 103 04/04/2022   CREATININE 0.80 04/04/2022   BUN 18 04/04/2022   CO2 31 04/04/2022   TSH 3.15 04/04/2022   PSA 3.37 04/04/2022   INR 1.0 08/24/2008   HGBA1C 5.8 07/11/2019    MR SHOULDER RIGHT W CONTRAST  Result Date: 10/17/2021 CLINICAL DATA:  MR arthrogram right shoulder eval RTC tear and possible biceps tendon subluxation EXAM: MRI OF THE RIGHT SHOULDER WITH CONTRAST TECHNIQUE: Multiplanar, multisequence MR imaging of the right shoulder was performed following the administration of intra-articular contrast. CONTRAST:   See Injection Documentation. COMPARISON:  Radiograph 09/07/2021 FINDINGS: Rotator cuff: There is moderate distal supraspinatus and infraspinatus tendinosis with bursal sided fraying. Teres minor tendon is intact. Subscapularis tendon is intact. Muscles: No significant muscle atrophy. Biceps Long Head: Intraarticular and extraarticular portions of the biceps tendon are intact. Acromioclavicular Joint: Mild arthropathy of the acromioclavicular joint. Small amount of subacromial/subdeltoid bursal fluid. Glenohumeral Joint: Adequate distension  by arthrogram technique. Mild chondrosis. Labrum: Posterosuperior labral tear from 10 o'clock-12 o'clock. Bones: No evidence of acute fracture.  No aggressive osseous lesion. Other: No fluid collection or hematoma. IMPRESSION: Moderate distal supraspinatus and infraspinatus tendinosis with bursal sided fraying. No high-grade or retracted cuff tear. Posterosuperior labral tear from 10:00-12:00. Mild glenohumeral and AC joint osteoarthritis. Intact biceps tendon. Electronically Signed   By: Maurine Simmering M.D.   On: 10/17/2021 12:58   Arthrogram  Result Date: 10/14/2021 CLINICAL DATA:  Impingement syndrome of right shoulder. EXAM: RIGHT SHOULDER INJECTION UNDER FLUOROSCOPY TECHNIQUE: An appropriate skin entrance site was determined. The site was marked, prepped with Betadine, draped in the usual sterile fashion, and infiltrated locally with 1% lidocaine. A 22 gauge spinal needle was advanced to the superomedial margin of the humeral head under intermittent fluoroscopy. 1 mL of 1% lidocaine injected easily. A mixture of 0.1 mL of MultiHance, 10 mL of Isovue-M 300, and 10 mL of sterile saline was then used to opacify the right shoulder capsule. No immediate complication. This exam was performed by Reatha Armour, PA, and was supervised and interpreted by Logan Bores, MD. FLUOROSCOPY: Radiation Exposure Index (as provided by the fluoroscopic device): 1.10 mGy Kerma IMPRESSION:  Technically successful right shoulder injection for MRI. Electronically Signed   By: Logan Bores M.D.   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 addiction risk, apnea etc) and complications (i.e. Somnolence, constipation and others) were explained to the patient and were aknowledged. Cont on Baclofen      Relevant Medications   TraMADol HCl 200 MG CP24   Leukocytosis    Monitor CBC      Headache   Relevant Medications   TraMADol HCl 200 MG CP24   Other Relevant Orders   TSH   Urinalysis   CBC with Differential/Platelet   Comprehensive metabolic panel   Testosterone   Dyslipidemia    Coronary calcium CT ordered      Relevant Orders   CT CARDIAC SCORING (SELF PAY ONLY)      Meds ordered this encounter  Medications   TraMADol HCl 200 MG CP24    Sig: Take 1 capsule by mouth daily.    Dispense:  90 capsule    Refill:  1      Follow-up: Return in about 6 months (around 11/01/2022) for Wellness Exam.  Walker Kehr, MD

## 2022-05-02 LAB — URINALYSIS
Bilirubin Urine: NEGATIVE
Ketones, ur: NEGATIVE
Leukocytes,Ua: NEGATIVE
Nitrite: NEGATIVE
Specific Gravity, Urine: 1.02 (ref 1.000–1.030)
Total Protein, Urine: NEGATIVE
Urine Glucose: NEGATIVE
Urobilinogen, UA: 0.2 (ref 0.0–1.0)
pH: 6 (ref 5.0–8.0)

## 2022-05-02 LAB — TESTOSTERONE: Testosterone: 297.29 ng/dL — ABNORMAL LOW (ref 300.00–890.00)

## 2022-05-02 LAB — TSH: TSH: 2.34 u[IU]/mL (ref 0.35–5.50)

## 2022-06-01 ENCOUNTER — Ambulatory Visit
Admission: RE | Admit: 2022-06-01 | Discharge: 2022-06-01 | Disposition: A | Discharge status: Home / Self Care | Payer: No Typology Code available for payment source | Source: Ambulatory Visit | Attending: Internal Medicine | Admitting: Internal Medicine

## 2022-06-01 DIAGNOSIS — E785 Hyperlipidemia, unspecified: Secondary | ICD-10-CM

## 2022-06-04 ENCOUNTER — Other Ambulatory Visit: Payer: Self-pay | Admitting: Internal Medicine

## 2022-06-04 ENCOUNTER — Encounter: Payer: Self-pay | Admitting: Internal Medicine

## 2022-06-04 DIAGNOSIS — I2510 Atherosclerotic heart disease of native coronary artery without angina pectoris: Secondary | ICD-10-CM | POA: Insufficient documentation

## 2022-06-04 DIAGNOSIS — I7121 Aneurysm of the ascending aorta, without rupture: Secondary | ICD-10-CM

## 2022-06-04 DIAGNOSIS — I719 Aortic aneurysm of unspecified site, without rupture: Secondary | ICD-10-CM | POA: Insufficient documentation

## 2022-06-04 MED ORDER — ASPIRIN 81 MG PO TBEC: 81.0000 mg | DELAYED_RELEASE_TABLET | Freq: Every day | ORAL | 3 refills | Status: AC

## 2022-06-08 ENCOUNTER — Other Ambulatory Visit: Payer: Self-pay | Admitting: Internal Medicine

## 2022-06-27 ENCOUNTER — Encounter: Payer: Self-pay | Admitting: Emergency Medicine

## 2022-06-27 ENCOUNTER — Ambulatory Visit (INDEPENDENT_AMBULATORY_CARE_PROVIDER_SITE_OTHER): Payer: Medicare Other | Admitting: Emergency Medicine

## 2022-06-27 VITALS — BP 112/74 | HR 94 | Temp 98.1°F | Ht 67.0 in | Wt 170.0 lb

## 2022-06-27 DIAGNOSIS — J22 Unspecified acute lower respiratory infection: Secondary | ICD-10-CM | POA: Insufficient documentation

## 2022-06-27 DIAGNOSIS — R051 Acute cough: Secondary | ICD-10-CM | POA: Diagnosis not present

## 2022-06-27 MED ORDER — HYDROCODONE BIT-HOMATROP MBR 5-1.5 MG/5ML PO SOLN: 5.0000 mL | Freq: Every evening | ORAL | 0 refills | Status: DC | PRN

## 2022-06-27 MED ORDER — DOXYCYCLINE HYCLATE 100 MG PO TABS: 100.0000 mg | ORAL_TABLET | Freq: Two times a day (BID) | ORAL | 0 refills | Status: AC

## 2022-06-27 NOTE — Patient Instructions (Signed)
Cough, Adult A cough helps to clear your throat and lungs. It may be a sign of an illness or another condition. A short-term (acute) cough may last 2-3 weeks. A long-term (chronic) cough may last 8 or more weeks. Many things can cause a cough. They include: Illnesses such as: An infection in your throat or lungs. Asthma or other heart or lung problems. Gastroesophageal reflux. This is when acid comes back up from your stomach. Breathing in things that bother (irritate) your lungs. Allergies. Postnasal drip. This is when mucus runs down the back of your throat. Smoking. Some medicines. Follow these instructions at home: Medicines Take over-the-counter and prescription medicines only as told by your doctor. Talk with your doctor before you take cough medicine (cough suppressants). Eating and drinking Do not drink alcohol. Do not drink caffeine. Drink enough fluid to keep your pee (urine) pale yellow. Lifestyle Stay away from cigarette smoke. Do not smoke or use any products that contain nicotine or tobacco. If you need help quitting, ask your doctor. Stay away from things that make you cough. These may include perfume, candles, cleaning products, or campfire smoke. General instructions  Watch for any changes to your cough. Tell your doctor about them. Always cover your mouth when you cough. If the air is dry in your home, use a cool mist vaporizer or humidifier. If your cough is worse at night, try using extra pillows to raise your head up higher while you sleep. Rest as needed. Contact a doctor if: You have new symptoms. Your symptoms get worse. You cough up pus. You have a fever that does not go away. Your cough does not get better after 2-3 weeks. Cough medicine does not help, and you are not sleeping well. You have pain that gets worse or is not helped with medicine. You are losing weight and do not know why. You have night sweats. Get help right away if: You cough up  blood. You have trouble breathing. Your heart is beating very fast. These symptoms may be an emergency. Get help right away. Call 911. Do not wait to see if the symptoms will go away. Do not drive yourself to the hospital. This information is not intended to replace advice given to you by your health care provider. Make sure you discuss any questions you have with your health care provider. Document Revised: 09/23/2021 Document Reviewed: 09/23/2021 Elsevier Patient Education  2023 [employer]  

## 2022-06-27 NOTE — Progress Notes (Signed)
[name] 67 y.o.   Chief Complaint  Patient presents with   Cough    Patient states he may have a URI, chest congestion, cough comes and goes x 3 days     HISTORY OF PRESENT ILLNESS: Acute problem visit today.  Patient of Dr. Sonda Primes This is a 67 y.o. male complaining of productive cough, chest congestion and flulike symptoms that started 4 days ago. Has history of primary progressive MS.  No treatment at present time.  Takes tramadol and baclofen daily. Z-Pak's did not work very well in the past.  Doxycycline works better. No other complaints or medical concerns today.  Cough Pertinent negatives include no chest pain, chills, fever, headaches or rash.     Prior to Admission medications   Medication Sig Start Date End Date Taking? Authorizing Provider  amitriptyline (ELAVIL) 25 MG tablet TAKE 2 TABLETS AT BEDTIME AS NEEDED FOR SLEEP 10/25/21  Yes Plotnikov, Georgina Quint, MD  aspirin EC 81 MG tablet Take 1 tablet (81 mg total) by mouth daily. 06/04/22 06/04/23 Yes Plotnikov, Georgina Quint, MD  baclofen (LIORESAL) 20 MG tablet TAKE 1 TABLET FOUR TIMES A DAY AS NEEDED FOR MUSCLE SPASMS 10/07/21  Yes Plotnikov, Georgina Quint, MD  cetirizine (ZYRTEC) 10 MG tablet Take 10 mg by mouth daily as needed for allergies.   Yes [provider]  cholecalciferol (VITAMIN D) 1000 units tablet Take 1,000 Units by mouth daily.   Yes [provider]  clotrimazole-betamethasone (LOTRISONE) cream APPLY 1 APPLICATION TOPICALLY TWICE A DAY AS NEEDED FOR IRRITATION 08/17/20  Yes Plotnikov, Georgina Quint, MD  fluticasone (FLONASE) 50 MCG/ACT nasal spray Place 2 sprays into both nostrils daily as needed for allergies or rhinitis.   Yes [provider]  furosemide (LASIX) 20 MG tablet Take 1 tablet (20 mg total) by mouth daily. 07/06/20  Yes Plotnikov, Georgina Quint, MD  gabapentin (NEURONTIN) 100 MG capsule TAKE 2 CAPSULES THREE TIMES A DAY AS NEEDED FOR PAIN 06/08/22  Yes Plotnikov, Georgina Quint, MD   HYDROcodone bit-homatropine (HYCODAN) 5-1.5 MG/5ML syrup Take 5 mLs by mouth every 8 (eight) hours as needed for cough. 06/09/21  Yes Plotnikov, Georgina Quint, MD  levofloxacin (LEVAQUIN) 500 MG tablet Take 1 tablet (500 mg total) by mouth daily. 06/22/21  Yes Plotnikov, Georgina Quint, MD  meloxicam (MOBIC) 7.5 MG tablet 1 tablet Orally Once a day   Yes [provider]  Multiple Vitamin (MULTIVITAMIN) capsule Take 1 tablet by mouth daily.   Yes [provider]  pantoprazole (PROTONIX) 40 MG tablet TAKE 1 TABLET DAILY 10/19/21  Yes Plotnikov, Georgina Quint, MD  Probiotic Product (ALIGN) 4 MG CAPS Take 1 capsule (4 mg total) by mouth daily. 10/06/20  Yes Plotnikov, Georgina Quint, MD  tadalafil (CIALIS) 5 MG tablet TAKE 1 TABLET DAILY 01/03/22  Yes Plotnikov, Georgina Quint, MD  TraMADol HCl 200 MG CP24 Take 1 capsule by mouth daily. 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   Hemangioma of skin and subcutaneous tissue 06/09/2021   Muscle weakness (generalized) 06/09/2021   Other reduced mobility 06/09/2021   Other seborrheic keratosis 06/09/2021   Blister 07/06/2020   Allergic rhinitis 05/26/2020   Constipation due to slow transit 02/05/2020   History of Clostridium difficile colitis 11/12/2019   Stool incontinence 09/10/2019   Urgency incontinence 09/10/2019   Infectious colitis    Abnormal CT scan,  colon    Benign neoplasm of cecum    Hematochezia    Peripheral neuropathy 08/31/2019   Chronic pancreatitis (HCC) 08/31/2019   Acute colitis 08/29/2019   Acute bronchitis 02/27/2018   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HCC) 11/09/2017   Influenza A    Viral gastroenteritis 03/20/2016   Nausea & vomiting 03/20/2016   Asthma 03/20/2016   Leukocytosis 03/20/2016   Intractable nausea and vomiting 03/20/2016   GERD (gastroesophageal reflux disease) 09/25/2014   Paraesophageal hernia 05/21/2014   Dysphagia 05/15/2014   Pharyngeal dysphagia 05/15/2014   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   Spells 04/27/2014   Wrist pain, right 09/11/2011   Left inguinal hernia 02/16/2011   Paresthesia 01/24/2011   Well adult exam 07/20/2010   GALLSTONE PANCREATITIS 04/15/2010   Diarrhea 04/15/2010   FREQUENCY, URINARY 04/15/2010   Abnormality of gait 04/05/2010   Headache 04/05/2010   INSOMNIA, CHRONIC 10/15/2009   Vitamin D deficiency 06/14/2009   SEBORRHEIC DERMATITIS 06/14/2009   LOW BACK PAIN 06/14/2009   Asthma exacerbation 04/19/2009   Acute upper respiratory infection 04/13/2009   Multiple sclerosis (HCC) 05/14/2008   Cough 05/14/2008    Past Medical History:  Diagnosis Date   Arthritis    Asthma    C. difficile diarrhea    Gallstone pancreatitis    Hernia    History of inguinal hernia repair    Low back pain    MS (multiple sclerosis) (HCC)    Pancreatitis hosp[italized with gallstone pancreatitis 3/12[   Vitamin D deficiency     Past Surgical History:  Procedure Laterality Date   BIOPSY  09/02/2019   Procedure: BIOPSY;  Surgeon: Meryl Dare, MD;  Location: [facility];  Service: Endoscopy;;   CHOLECYSTECTOMY  04/2010   COLONOSCOPY WITH PROPOFOL N/A 09/02/2019   Procedure: COLONOSCOPY WITH PROPOFOL;  Surgeon: Meryl Dare, MD;  Location: [facility];  Service: Endoscopy;  Laterality: N/A;   CYSTECTOMY     on back and R hand   CYSTECTOMY     left arm    HERNIA REPAIR     x 2   INGUINAL HERNIA REPAIR  03/17/2011   Procedure: HERNIA REPAIR INGUINAL ADULT;  Surgeon: [name], MD;  Location: [facility];  Service: General;  Laterality: Left;  Open left inguinal hernia repair with mesh   KNEE ARTHROSCOPY     right   NOSE SURGERY     POLYPECTOMY  09/02/2019   Procedure: POLYPECTOMY;  Surgeon: Meryl Dare, MD;  Location: [facility];  Service: Endoscopy;;   TONSILLECTOMY     WISDOM TOOTH EXTRACTION      Social History   Socioeconomic History   Marital status: Married    Spouse name: Not on file   Number of children: 2   Years of education: Not on file   Highest education level: Associate degree: occupational, Scientist, product/process development, or vocational program  Occupational History   Occupation: Counsellor  Employer: [employer]  Tobacco Use   Smoking status: Never   Smokeless tobacco: Never  Vaping Use   Vaping Use: Never used  Substance and Sexual Activity   Alcohol use: No   Drug use: No   Sexual activity: Yes  Other Topics Concern   Not on file  Social History Narrative   Not on file   Social Determinants of Health   Financial Resource Strain: Low Risk  (04/29/2022)   Overall Financial Resource Strain (CARDIA)    Difficulty of Paying Living Expenses: Not hard at all  Food Insecurity: No Food Insecurity (04/29/2022)   Hunger Vital Sign    Worried About Running Out of Food in the Last Year: Never true    Ran Out of Food in the Last Year: Never true  Transportation Needs: No Transportation Needs (04/29/2022)   PRAPARE - Administrator, Civil Service (Medical): No    Lack of Transportation (Non-Medical): No  Physical Activity: Sufficiently Active (04/29/2022)   Exercise Vital Sign    Days of Exercise per Week: 3 days    Minutes of Exercise per Session: 60 min  Stress: No Stress Concern Present (04/29/2022)   [employer] of Occupational Health - Occupational Stress Questionnaire    Feeling of  Stress : Not at all  Social Connections: Moderately Isolated (04/29/2022)   Social Connection and Isolation Panel [NHANES]    Frequency of Communication with Friends and Family: Twice a week    Frequency of Social Gatherings with Friends and Family: Once a week    Attends Religious Services: Never    Database administrator or Organizations: No    Attends Engineer, structural: Not on file    Marital Status: Married  Catering manager Violence: Not At Risk (04/06/2021)   Humiliation, Afraid, Rape, and Kick questionnaire    Fear of Current or Ex-Partner: No    Emotionally Abused: No    Physically Abused: No    Sexually Abused: No    Family History  Problem Relation Age of Onset   Lung cancer Father        not related to smoking   Diabetes Mother    Colon cancer Neg Hx    Esophageal cancer Neg Hx    Pancreatic cancer Neg Hx    Stomach cancer Neg Hx    Liver disease Neg Hx      Review of Systems  Constitutional: Negative.  Negative for chills and fever.  HENT:  Positive for congestion.   Respiratory:  Positive for cough.   Cardiovascular: Negative.  Negative for chest pain and palpitations.  Gastrointestinal:  Negative for abdominal pain, nausea and vomiting.  Genitourinary: Negative.  Negative for dysuria and hematuria.  Skin: Negative.  Negative for rash.  Neurological: Negative.  Negative for dizziness and headaches.  All other systems reviewed and are negative.   Today's Vitals   06/27/22 0826  BP: 112/74  Pulse: 94  Temp: 98.1 F (36.7 C)  TempSrc: Oral  SpO2: 97%  Weight: 170 lb (77.1 kg)  Height: 5\' 7"  (1.702 m)   Body mass index is 26.63 kg/m.   Physical Exam Vitals reviewed.  Constitutional:      Appearance: Normal appearance.  HENT:     Head: Normocephalic.     Mouth/Throat:     Mouth: Mucous membranes are moist.     Pharynx: Oropharynx is clear.  Eyes:     Extraocular Movements: Extraocular movements intact.  Pupils: Pupils are equal,  round, and reactive to light.  Cardiovascular:     Rate and Rhythm: Normal rate and regular rhythm.     Pulses: Normal pulses.     Heart sounds: Normal heart sounds.  Pulmonary:     Effort: Pulmonary effort is normal.     Breath sounds: Normal breath sounds.  Musculoskeletal:     Cervical back: No tenderness.  Lymphadenopathy:     Cervical: No cervical adenopathy.  Skin:    General: Skin is warm and dry.     Capillary Refill: Capillary refill takes less than 2 seconds.  Neurological:     Mental Status: He is alert and oriented to person, place, and time.  Psychiatric:        Mood and Affect: Mood normal.        Behavior: Behavior normal.      ASSESSMENT & PLAN: A total of 33 minutes was spent with the patient and counseling/coordination of care regarding preparing for this visit, review of most recent office visit notes, review of multiple chronic medical conditions including MS, review of all medications, diagnosis of lower respiratory infection and need for antibiotics, cough management, prognosis, documentation, and need for follow-up if no better or worse during the next several days.  Problem List Items Addressed This Visit       Respiratory   Lower respiratory infection - Primary    Upper viral infection now with secondary bacterial infection Recommend to start doxycycline 100 mg twice a day for 7 days Clinically stable.  No signs of pneumonia. ED precautions given Advised to contact the office if no better or worse during the next several days      Relevant Medications   doxycycline (VIBRA-TABS) 100 MG tablet     Other   Cough    Cough management discussed Advised to take over-the-counter Mucinex DM and cough drops Advised to rest and stay well-hydrated Use Hycodan syrup as needed      Relevant Medications   HYDROcodone bit-homatropine (HYCODAN) 5-1.5 MG/5ML syrup   Patient Instructions  Cough, Adult A cough helps to clear your throat and lungs. It may be  a sign of an illness or another condition. A short-term (acute) cough may last 2-3 weeks. A long-term (chronic) cough may last 8 or more weeks. Many things can cause a cough. They include: Illnesses such as: An infection in your throat or lungs. Asthma or other heart or lung problems. Gastroesophageal reflux. This is when acid comes back up from your stomach. Breathing in things that bother (irritate) your lungs. Allergies. Postnasal drip. This is when mucus runs down the back of your throat. Smoking. Some medicines. Follow these instructions at home: Medicines Take over-the-counter and prescription medicines only as told by your doctor. Talk with your doctor before you take cough medicine (cough suppressants). Eating and drinking Do not drink alcohol. Do not drink caffeine. Drink enough fluid to keep your pee (urine) pale yellow. Lifestyle Stay away from cigarette smoke. Do not smoke or use any products that contain nicotine or tobacco. If you need help quitting, ask your doctor. Stay away from things that make you cough. These may include perfume, candles, cleaning products, or campfire smoke. General instructions  Watch for any changes to your cough. Tell your doctor about them. Always cover your mouth when you cough. If the air is dry in your home, use a cool mist vaporizer or humidifier. If your cough is worse at night, try using extra pillows  to raise your head up higher while you sleep. Rest as needed. Contact a doctor if: You have new symptoms. Your symptoms get worse. You cough up pus. You have a fever that does not go away. Your cough does not get better after 2-3 weeks. Cough medicine does not help, and you are not sleeping well. You have pain that gets worse or is not helped with medicine. You are losing weight and do not know why. You have night sweats. Get help right away if: You cough up blood. You have trouble breathing. Your heart is beating very  fast. These symptoms may be an emergency. Get help right away. Call 911. Do not wait to see if the symptoms will go away. Do not drive yourself to the hospital. This information is not intended to replace advice given to you by your health care provider. Make sure you discuss any questions you have with your health care provider. Document Revised: 09/23/2021 Document Reviewed: 09/23/2021 Elsevier Patient Education  2023 Elsevier Inc.      Edwina Barth, MD [facility]

## 2022-06-27 NOTE — Assessment & Plan Note (Signed)
Cough management discussed Advised to take over-the-counter Mucinex DM and cough drops Advised to rest and stay well-hydrated Use Hycodan syrup as needed

## 2022-06-27 NOTE — Assessment & Plan Note (Signed)
Upper viral infection now with secondary bacterial infection Recommend to start doxycycline 100 mg twice a day for 7 days Clinically stable.  No signs of pneumonia. ED precautions given Advised to contact the office if no better or worse during the next several days

## 2022-07-07 ENCOUNTER — Ambulatory Visit: Payer: Medicare Other | Admitting: Cardiology

## 2022-07-18 ENCOUNTER — Encounter: Payer: Self-pay | Admitting: Family Medicine

## 2022-07-18 ENCOUNTER — Ambulatory Visit (INDEPENDENT_AMBULATORY_CARE_PROVIDER_SITE_OTHER): Payer: Medicare Other | Admitting: Family Medicine

## 2022-07-18 VITALS — BP 124/78 | HR 75 | Temp 98.0°F | Wt 173.8 lb

## 2022-07-18 DIAGNOSIS — R051 Acute cough: Secondary | ICD-10-CM

## 2022-07-18 DIAGNOSIS — R1310 Dysphagia, unspecified: Secondary | ICD-10-CM | POA: Diagnosis not present

## 2022-07-18 NOTE — Progress Notes (Unsigned)
Assessment/Plan:   Problem List Items Addressed This Visit   None   There are no discontinued medications.  No follow-ups on file.    Subjective:   Encounter date: 07/18/2022  [name] is a 67 y.o. male who has Vitamin D deficiency; INSOMNIA, CHRONIC; Multiple sclerosis (HCC); Acute upper respiratory infection; Asthma exacerbation; SEBORRHEIC DERMATITIS; LOW BACK PAIN; Cough; Abnormality of gait; Headache; GALLSTONE PANCREATITIS; Diarrhea; FREQUENCY, URINARY; Well adult exam; Paresthesia; Left inguinal hernia; Wrist pain, right; GERD (gastroesophageal reflux disease); Viral gastroenteritis; Nausea & vomiting; Asthma; Leukocytosis; Intractable nausea and vomiting; Influenza A; Acute bronchitis; Acute colitis; Peripheral neuropathy; Chronic pancreatitis (HCC); Abnormal CT scan, colon; Benign neoplasm of cecum; Hematochezia; Infectious colitis; Stool incontinence; Urgency incontinence; History of Clostridium difficile colitis; Constipation due to slow transit; Allergic rhinitis; Blister; Dysphagia; Exocrine pancreatic insufficiency; Hemangioma of skin and subcutaneous tissue; Muscle weakness (generalized); Other reduced mobility; Other seborrheic keratosis; Pharyngeal dysphagia; 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   He  has a past medical history of Arthritis, Asthma, C. difficile diarrhea, Gallstone pancreatitis, Hernia, History of inguinal hernia repair, Low back pain, MS (multiple sclerosis) (HCC), Pancreatitis (hosp[italized with gallstone pancreatitis 3/12[), and Vitamin D deficiency.[name]   He presents with chief complaint of Medical Management of Chronic Issues (Upper  respiratory infection. Drainage down into throat causing cough w/ chest congestion. Was tx with doxycycline. Hx of double pneumonia.) .   HPI:   ROS  Past Surgical History:  Procedure Laterality Date   BIOPSY  09/02/2019   Procedure: BIOPSY;  Surgeon: Meryl Dare, MD;  Location: [facility];  Service: Endoscopy;;   CHOLECYSTECTOMY  04/2010   COLONOSCOPY WITH PROPOFOL N/A 09/02/2019   Procedure: COLONOSCOPY WITH PROPOFOL;  Surgeon: Meryl Dare, MD;  Location: [facility];  Service: Endoscopy;  Laterality: N/A;   CYSTECTOMY     on back and R hand   CYSTECTOMY     left arm   HERNIA REPAIR     x 2   INGUINAL HERNIA REPAIR  03/17/2011   Procedure: HERNIA REPAIR INGUINAL ADULT;  Surgeon: [name], MD;  Location: [facility];  Service: General;  Laterality: Left;  Open left inguinal hernia repair with mesh   KNEE ARTHROSCOPY     right   NOSE SURGERY     POLYPECTOMY  09/02/2019   Procedure: POLYPECTOMY;  Surgeon: Meryl Dare, MD;  Location: [facility];  Service: Endoscopy;;   TONSILLECTOMY     WISDOM TOOTH EXTRACTION      Outpatient Medications Prior to Visit  Medication Sig Dispense Refill   amitriptyline (ELAVIL) 25 MG tablet TAKE 2 TABLETS AT BEDTIME AS NEEDED FOR SLEEP 180 tablet 3   aspirin EC 81 MG tablet Take 1 tablet (81 mg total) by mouth daily. 100 tablet 3   baclofen (LIORESAL) 20 MG tablet TAKE 1 TABLET FOUR TIMES A DAY AS NEEDED FOR MUSCLE SPASMS 360 tablet 3   cetirizine (ZYRTEC) 10 MG tablet Take 10 mg by mouth daily as needed for allergies.     cholecalciferol (VITAMIN D) 1000 units tablet Take 1,000 Units by mouth daily.     clotrimazole-betamethasone (LOTRISONE) cream APPLY 1 APPLICATION TOPICALLY TWICE A DAY AS NEEDED FOR IRRITATION 30 g 2   fluticasone (FLONASE)  50 MCG/ACT nasal spray Place 2 sprays into both nostrils daily as needed for allergies or rhinitis.     gabapentin (NEURONTIN) 100 MG capsule TAKE 2 CAPSULES THREE TIMES A  DAY AS NEEDED FOR PAIN 360 capsule 1   HYDROcodone bit-homatropine (HYCODAN) 5-1.5 MG/5ML syrup Take 5 mLs by mouth at bedtime as needed for cough. 120 mL 0   Multiple Vitamin (MULTIVITAMIN) capsule Take 1 tablet by mouth daily.     pantoprazole (PROTONIX) 40 MG tablet TAKE 1 TABLET DAILY 90 tablet 3   Probiotic Product (ALIGN) 4 MG CAPS Take 1 capsule (4 mg total) by mouth daily. 90 capsule 3   tadalafil (CIALIS) 5 MG tablet TAKE 1 TABLET DAILY 90 tablet 2   TraMADol HCl 200 MG CP24 Take 1 capsule by mouth daily. 90 capsule 1   furosemide (LASIX) 20 MG tablet Take 1 tablet (20 mg total) by mouth daily. (Patient not taking: Reported on 07/18/2022) 30 tablet 3   levofloxacin (LEVAQUIN) 500 MG tablet Take 1 tablet (500 mg total) by mouth daily. (Patient not taking: Reported on 07/18/2022) 7 tablet 0   meloxicam (MOBIC) 7.5 MG tablet 1 tablet Orally Once a day (Patient not taking: Reported on 07/18/2022)     No facility-administered medications prior to visit.    Family History  Problem Relation Age of Onset   Lung cancer Father        not related to smoking   Diabetes Mother    Colon cancer Neg Hx    Esophageal cancer Neg Hx    Pancreatic cancer Neg Hx    Stomach cancer Neg Hx    Liver disease Neg Hx     Social History   Socioeconomic History   Marital status: Married    Spouse name: Not on file   Number of children: 2   Years of education: Not on file   Highest education level: Associate degree: occupational, Scientist, product/process development, or vocational program  Occupational History   Occupation: Water quality scientist: [employer]  Tobacco Use   Smoking status: Never   Smokeless tobacco: Never  Vaping Use   Vaping Use: Never used  Substance and Sexual Activity   Alcohol use: No   Drug use: No   Sexual activity: Yes  Other Topics Concern   Not on file  Social History Narrative   Not on file   Social Determinants of Health   Financial Resource Strain: Low Risk  (04/29/2022)   Overall  Financial Resource Strain (CARDIA)    Difficulty of Paying Living Expenses: Not hard at all  Food Insecurity: No Food Insecurity (04/29/2022)   Hunger Vital Sign    Worried About Running Out of Food in the Last Year: Never true    Ran Out of Food in the Last Year: Never true  Transportation Needs: No Transportation Needs (04/29/2022)   PRAPARE - Administrator, Civil Service (Medical): No    Lack of Transportation (Non-Medical): No  Physical Activity: Sufficiently Active (04/29/2022)   Exercise Vital Sign    Days of Exercise per Week: 3 days    Minutes of Exercise per Session: 60 min  Stress: No Stress Concern Present (04/29/2022)   [employer] of Occupational Health - Occupational Stress Questionnaire    Feeling of Stress : Not at all  Social Connections: Moderately Isolated (04/29/2022)   Social Connection and Isolation Panel [NHANES]    Frequency of Communication with Friends and Family: Twice a week  Frequency of Social Gatherings with Friends and Family: Once a week    Attends Religious Services: Never    Database administrator or Organizations: No    Attends Engineer, structural: Not on file    Marital Status: Married  Catering manager Violence: Not At Risk (04/06/2021)   Humiliation, Afraid, Rape, and Kick questionnaire    Fear of Current or Ex-Partner: No    Emotionally Abused: No    Physically Abused: No    Sexually Abused: No                                                                                                  Objective:  Physical Exam: BP 124/78 (BP Location: Right Arm, Patient Position: Sitting, Cuff Size: Large)   Pulse 75   Temp 98 F (36.7 C) (Temporal)   Wt 173 lb 12.8 oz (78.8 kg)   SpO2 97%   BMI 27.22 kg/m   Wt Readings from Last 3 Encounters:  07/18/22 173 lb 12.8 oz (78.8 kg)  06/27/22 170 lb (77.1 kg)  05/01/22 174 lb (78.9 kg)     Physical Exam  CT CARDIAC SCORING ([facility] LOCATIONS ONLY)  Result Date:  06/03/2022 CLINICAL DATA:  Dyslipidemia * Tracking Code: FCC * EXAM: CT CARDIAC CORONARY ARTERY CALCIUM SCORE TECHNIQUE: Non-contrast imaging through the heart was performed using prospective ECG gating. Image post processing was performed on an independent workstation, allowing for quantitative analysis of the heart and coronary arteries. Note that this exam targets the heart and the chest was not imaged in its entirety. COMPARISON:  None available. FINDINGS: CORONARY CALCIUM SCORES: Left Main: 0 LAD: 52.7 LCx: 0 RCA: 3.8 Total Agatston Score: 56.5 MESA database percentile: 41 AORTA MEASUREMENTS: Ascending Aorta: 4.0 cm Descending Aorta:3.0 cm OTHER FINDINGS: Heart is normal size. Slight aneurysmal dilatation of the ascending thoracic aorta measuring 4.0 cm. No adenopathy. No confluent airspace opacities or effusions. No acute findings in the upper abdomen. Chest wall soft tissues are unremarkable. No acute bony abnormality. IMPRESSION: Total Agatston score: 56.5 [place] database percentile: 41 4 cm ascending thoracic aortic aneurysm. Recommend annual imaging followup by CTA or MRA. This recommendation follows 2010 ACCF/AHA/AATS/ACR/ASA/SCA/SCAI/SIR/STS/SVM Guidelines for the Diagnosis and Management of Patients with Thoracic Aortic Disease. Circulation. 2010; 121: Z610-R604. Aortic aneurysm NOS (ICD10-I71.9) Electronically Signed   By: Charlett Nose M.D.   On: 06/03/2022 15:42    Recent Results (from the past 2160 hour(s))  Testosterone     Status: Abnormal   Collection Time: 05/01/22  2:47 PM  Result Value Ref Range   Testosterone 297.29 (L) 300.00 - 890.00 ng/dL  Comprehensive metabolic panel     Status: Abnormal   Collection Time: 05/01/22  2:47 PM  Result Value Ref Range   Sodium 141 135 - 145 mEq/L   Potassium 4.2 3.5 - 5.1 mEq/L   Chloride 105 96 - 112 mEq/L   CO2 28 19 - 32 mEq/L   Glucose, Bld 101 (H) 70 - 99 mg/dL   BUN 13 6 - 23 mg/dL   Creatinine, Ser 5.40 0.40 - 1.50 mg/dL  Total  Bilirubin 0.5 0.2 - 1.2 mg/dL   Alkaline Phosphatase 70 39 - 117 U/L   AST 17 0 - 37 U/L   ALT 19 0 - 53 U/L   Total Protein 6.9 6.0 - 8.3 g/dL   Albumin 4.3 3.5 - 5.2 g/dL   GFR 16.10 >96.04 mL/min    Comment: Calculated using the CKD-EPI Creatinine Equation (2021)   Calcium 9.2 8.4 - 10.5 mg/dL  CBC with Differential/Platelet     Status: None   Collection Time: 05/01/22  2:47 PM  Result Value Ref Range   WBC 5.4 4.0 - 10.5 K/uL   RBC 4.82 4.22 - 5.81 Mil/uL   Hemoglobin 15.3 13.0 - 17.0 g/dL   HCT 54.0 98.1 - 19.1 %   MCV 93.9 78.0 - 100.0 fl   MCHC 33.9 30.0 - 36.0 g/dL   RDW 47.8 29.5 - 62.1 %   Platelets 172.0 150.0 - 400.0 K/uL   Neutrophils Relative % 61.0 43.0 - 77.0 %   Lymphocytes Relative 27.0 12.0 - 46.0 %   Monocytes Relative 9.8 3.0 - 12.0 %   Eosinophils Relative 1.3 0.0 - 5.0 %   Basophils Relative 0.9 0.0 - 3.0 %   Neutro Abs 3.3 1.4 - 7.7 K/uL   Lymphs Abs 1.5 0.7 - 4.0 K/uL   Monocytes Absolute 0.5 0.1 - 1.0 K/uL   Eosinophils Absolute 0.1 0.0 - 0.7 K/uL   Basophils Absolute 0.0 0.0 - 0.1 K/uL  Urinalysis     Status: Abnormal   Collection Time: 05/01/22  2:47 PM  Result Value Ref Range   Color, Urine YELLOW Yellow;Lt. Yellow;Straw;Dark Yellow;Amber;Green;Red;Brown   APPearance CLEAR Clear;Turbid;Slightly Cloudy;Cloudy   Specific Gravity, Urine 1.020 1.000 - 1.030   pH 6.0 5.0 - 8.0   Total Protein, Urine NEGATIVE Negative   Urine Glucose NEGATIVE Negative   Ketones, ur NEGATIVE Negative   Bilirubin Urine NEGATIVE Negative   Hgb urine dipstick TRACE-LYSED (A) Negative   Urobilinogen, UA 0.2 0.0 - 1.0   Leukocytes,Ua NEGATIVE Negative   Nitrite NEGATIVE Negative  TSH     Status: None   Collection Time: 05/01/22  2:47 PM  Result Value Ref Range   TSH 2.34 0.35 - 5.50 uIU/mL        Garner Nash, MD, MS

## 2022-07-18 NOTE — Patient Instructions (Signed)
Avoid consuming cold foods and beverages, as they seem to trigger chest pain and coughing. Continue taking the Flonase as prescribed, with two sprays once a day to manage nasal congestion and potential post-nasal drip. Monitor your symptoms closely, especially for signs of infection such as fever or wheezing, and use cough medicine as needed for relief. Schedule a chest X-ray at the [facility] location to rule out pneumonia and ensure no serious underlying conditions. Follow up with a gastroenterologist or ENT specialist if symptoms persist or worsen, to evaluate possible swallowing issues or silent reflux that might be contributing to your cough.

## 2022-07-19 NOTE — Assessment & Plan Note (Signed)
The patient presents with a persistent cough that is exacerbated by cold foods and drinks, accompanied by chest pain. Multiple sclerosis and prior intubation injury may contribute to the complexity of this presentation.  Differential diagnosis:  Reflux-related cough Viral/post-viral cough Aspiration secondary to dysphagia and MS  Plan:  Chest X-ray: Ordered to rule out pneumonia. Flonase: 2 sprays once a day to manage potential postnasal drip or allergic rhinitis. Symptomatic treatment: Continue using Hycodan and Halls cough drops. Swallow Evaluation: Consider referral to a gastroenterologist or ENT for evaluation of dysphagia and potential silent reflux. Infection Screening: Monitor for signs of infection; avoid unnecessary antibiotics due to history of C. diff.

## 2022-07-20 ENCOUNTER — Ambulatory Visit (INDEPENDENT_AMBULATORY_CARE_PROVIDER_SITE_OTHER): Payer: Medicare Other

## 2022-07-20 DIAGNOSIS — R051 Acute cough: Secondary | ICD-10-CM | POA: Diagnosis not present

## 2022-07-20 DIAGNOSIS — R059 Cough, unspecified: Secondary | ICD-10-CM | POA: Diagnosis not present

## 2022-08-27 NOTE — Progress Notes (Unsigned)
Cardiology Office Note:    Date:  08/29/2022   ID:  [name], DOB [birth year], MRN [id number]  PCP:  Tresa Garter, MD  Cardiologist:  None  Electrophysiologist:  None   Referring MD: Tresa Garter, MD   Chief Complaint  Patient presents with   Coronary Artery Disease    History of Present Illness:    [name] is a 67 y.o. male with a hx of asthma, gallstone pancreatitis, MS who is referred by Dr. Posey Rea for evaluation of CAD.  Denies any chest pain, dyspnea, lightheadedness, syncope,or palpitations.  Does reports chronic swelling in left leg.  No smoking history.  Family history includes mother had CVA in 28s.  Calcium score on 05/2022 was 57 (41st percentile), also noted to have dilated ascending aorta measuring 4 cm.  Past Medical History:  Diagnosis Date   Arthritis    Asthma    C. difficile diarrhea    Gallstone pancreatitis    Hernia    History of inguinal hernia repair    Low back pain    MS (multiple sclerosis) (HCC)    Pancreatitis hosp[italized with gallstone pancreatitis 3/12[   Vitamin D deficiency     Past Surgical History:  Procedure Laterality Date   BIOPSY  09/02/2019   Procedure: BIOPSY;  Surgeon: Meryl Dare, MD;  Location: [facility];  Service: Endoscopy;;   CHOLECYSTECTOMY  04/2010   COLONOSCOPY WITH PROPOFOL N/A 09/02/2019   Procedure: COLONOSCOPY WITH PROPOFOL;  Surgeon: Meryl Dare, MD;  Location: [facility];  Service: Endoscopy;  Laterality: N/A;   CYSTECTOMY     on back and R hand   CYSTECTOMY     left arm   HERNIA REPAIR     x 2   INGUINAL HERNIA REPAIR  03/17/2011   Procedure: HERNIA REPAIR INGUINAL ADULT;  Surgeon: [name], MD;  Location: [facility];  Service: General;  Laterality: Left;  Open left inguinal hernia repair with mesh   KNEE ARTHROSCOPY     right   NOSE SURGERY     POLYPECTOMY  09/02/2019   Procedure: POLYPECTOMY;  Surgeon: Meryl Dare, MD;  Location: [facility];  Service: Endoscopy;;   TONSILLECTOMY     WISDOM TOOTH EXTRACTION      Current Medications: Current Meds  Medication Sig   amitriptyline (ELAVIL) 25 MG tablet TAKE 2 TABLETS AT BEDTIME AS NEEDED FOR SLEEP   aspirin EC 81 MG tablet Take 1 tablet (81 mg total) by mouth daily.   atorvastatin (LIPITOR) 10 MG tablet Take 1 tablet (10 mg total) by mouth daily.   baclofen (LIORESAL) 20 MG tablet TAKE 1 TABLET FOUR TIMES A DAY AS NEEDED FOR MUSCLE SPASMS   cetirizine (ZYRTEC) 10 MG tablet Take 10 mg by mouth daily as needed for allergies.   cholecalciferol (VITAMIN D) 1000 units tablet Take 1,000 Units by mouth daily.   clotrimazole-betamethasone (LOTRISONE) cream APPLY 1 APPLICATION TOPICALLY TWICE A DAY AS NEEDED FOR IRRITATION   fluticasone (FLONASE) 50 MCG/ACT nasal spray Place 2 sprays into both nostrils daily as needed for allergies or rhinitis.   furosemide (LASIX) 20 MG tablet Take 1 tablet (20 mg total) by mouth daily.   gabapentin (NEURONTIN) 100 MG capsule TAKE 2 CAPSULES THREE TIMES A DAY AS NEEDED FOR PAIN   HYDROcodone bit-homatropine (HYCODAN) 5-1.5 MG/5ML syrup Take 5 mLs by mouth at bedtime as needed for cough.   Multiple Vitamin (MULTIVITAMIN) capsule Take 1 tablet by mouth  daily.   pantoprazole (PROTONIX) 40 MG tablet TAKE 1 TABLET DAILY   Probiotic Product (ALIGN) 4 MG CAPS Take 1 capsule (4 mg total) by mouth daily.   tadalafil (CIALIS) 5 MG tablet TAKE 1 TABLET DAILY   TraMADol HCl 200 MG CP24 Take 1 capsule by mouth daily.     Allergies:   Meperidine, Ocrevus [ocrelizumab], Penicillin g, and Prednisone   Social History   Socioeconomic History   Marital status: Married    Spouse name: [name]   Number of children: 2   Years of education: Not on file   Highest education level: Associate degree: occupational, Scientist, product/process development, or vocational program  Occupational History   Occupation: Water quality scientist: [employer]  Tobacco Use   Smoking status: Never    Smokeless tobacco: Never  Vaping Use   Vaping status: Never Used  Substance and Sexual Activity   Alcohol use: No   Drug use: No   Sexual activity: Yes  Other Topics Concern   Not on file  Social History Narrative   Not on file   Social Determinants of Health   Financial Resource Strain: Low Risk  (08/29/2022)   Overall Financial Resource Strain (CARDIA)    Difficulty of Paying Living Expenses: Not hard at all  Food Insecurity: No Food Insecurity (08/29/2022)   Hunger Vital Sign    Worried About Running Out of Food in the Last Year: Never true    Ran Out of Food in the Last Year: Never true  Transportation Needs: No Transportation Needs (08/29/2022)   PRAPARE - Administrator, Civil Service (Medical): No    Lack of Transportation (Non-Medical): No  Physical Activity: Unknown (08/29/2022)   Exercise Vital Sign    Days of Exercise per Week: Patient declined    Minutes of Exercise per Session: 30 min  Stress: No Stress Concern Present (08/29/2022)   [employer] of Occupational Health - Occupational Stress Questionnaire    Feeling of Stress : Not at all  Social Connections: Socially Isolated (08/29/2022)   Social Connection and Isolation Panel [NHANES]    Frequency of Communication with Friends and Family: Once a week    Frequency of Social Gatherings with Friends and Family: Once a week    Attends Religious Services: Never    Database administrator or Organizations: No    Attends Engineer, structural: Never    Marital Status: Married     Family History: The patient's family history includes Diabetes in his mother; Lung cancer in his father. There is no history of Colon cancer, Esophageal cancer, Pancreatic cancer, Stomach cancer, or Liver disease.  ROS:   Please see the history of present illness.     All other systems reviewed and are negative.  EKGs/Labs/Other Studies Reviewed:    The following studies were reviewed today:   EKG:   08/29/2022:  Normal sinus rhythm, rate 82, PACs, LVH  Recent Labs: 05/01/2022: ALT 19; BUN 13; Creatinine, Ser 0.86; Hemoglobin 15.3; Platelets 172.0; Potassium 4.2; Sodium 141; TSH 2.34  Recent Lipid Panel    Component Value Date/Time   CHOL 139 04/04/2022 0832   TRIG 105.0 04/04/2022 0832   HDL 40.70 04/04/2022 0832   CHOLHDL 3 04/04/2022 0832   VLDL 21.0 04/04/2022 0832   LDLCALC 77 04/04/2022 0832   LDLDIRECT 118.4 06/09/2009 1516    Physical Exam:    VS:  BP 138/85 (BP Location: Left Arm, Patient Position: Sitting, Cuff Size: Normal)  Pulse 89   Ht 5\' 7"  (1.702 m)   Wt 174 lb 9.6 oz (79.2 kg)   SpO2 94%   BMI 27.35 kg/m     Wt Readings from Last 3 Encounters:  08/29/22 174 lb 9.6 oz (79.2 kg)  08/29/22 173 lb (78.5 kg)  07/18/22 173 lb 12.8 oz (78.8 kg)     GEN:  Well nourished, well developed in no acute distress HEENT: Normal NECK: No JVD; No carotid bruits CARDIAC: RRR, no murmurs, rubs, gallops RESPIRATORY:  Clear to auscultation without rales, wheezing or rhonchi  ABDOMEN: Soft, non-tender, non-distended MUSCULOSKELETAL: 2+ left lower extremity edema SKIN: Warm and dry NEUROLOGIC:  Alert and oriented x 3 PSYCHIATRIC:  Normal affect   ASSESSMENT:    1. Coronary artery disease involving native coronary artery of native heart without angina pectoris   2. Left leg swelling   3. Aortic dilatation (HCC)    PLAN:    CAD: Calcium score on 05/2022 was 57 (41st percentile) -LDL 77 on 04/04/2022.  Recommend starting atorvastatin 10 mg daily  Left lower extremity edema: Check lower extremity duplex to rule out DVT given asymmetric edema  Dilated ascending aorta: Measured 4 cm on calcium score 05/2022.  Recommend echocardiogram in 1 year to follow  RTC in 6 months   Medication Adjustments/Labs and Tests Ordered: Current medicines are reviewed at length with the patient today.  Concerns regarding medicines are outlined above.  Orders Placed This Encounter  Procedures    EKG 12-Lead   VAS [place] LOWER EXTREMITY VENOUS (DVT)   Meds ordered this encounter  Medications   atorvastatin (LIPITOR) 10 MG tablet    Sig: Take 1 tablet (10 mg total) by mouth daily.    Dispense:  90 tablet    Refill:  3    There are no Patient Instructions on file for this visit.   Signed, Little Ishikawa, MD  08/29/2022 2:16 PM    [facility]

## 2022-08-29 ENCOUNTER — Encounter: Payer: Self-pay | Admitting: Cardiology

## 2022-08-29 ENCOUNTER — Ambulatory Visit: Payer: Medicare Other | Attending: Cardiology | Admitting: Cardiology

## 2022-08-29 ENCOUNTER — Ambulatory Visit (INDEPENDENT_AMBULATORY_CARE_PROVIDER_SITE_OTHER): Payer: Medicare Other

## 2022-08-29 VITALS — Ht 67.0 in | Wt 173.0 lb

## 2022-08-29 VITALS — BP 138/85 | HR 89 | Ht 67.0 in | Wt 174.6 lb

## 2022-08-29 DIAGNOSIS — Z0000 Encounter for general adult medical examination without abnormal findings: Secondary | ICD-10-CM | POA: Diagnosis not present

## 2022-08-29 DIAGNOSIS — M7989 Other specified soft tissue disorders: Secondary | ICD-10-CM | POA: Diagnosis not present

## 2022-08-29 DIAGNOSIS — Z1211 Encounter for screening for malignant neoplasm of colon: Secondary | ICD-10-CM

## 2022-08-29 DIAGNOSIS — I2510 Atherosclerotic heart disease of native coronary artery without angina pectoris: Secondary | ICD-10-CM | POA: Diagnosis not present

## 2022-08-29 DIAGNOSIS — I77819 Aortic ectasia, unspecified site: Secondary | ICD-10-CM | POA: Diagnosis not present

## 2022-08-29 MED ORDER — ATORVASTATIN CALCIUM 10 MG PO TABS: 10.0000 mg | ORAL_TABLET | Freq: Every day | ORAL | 3 refills | Status: DC

## 2022-08-29 NOTE — Progress Notes (Addendum)
Subjective:   [name] is a 67 y.o. male who presents for Medicare Annual/Subsequent preventive examination.  Visit Complete: Virtual  I connected with  [name] on 08/29/22 by a audio enabled telemedicine application and verified that I am speaking with the correct person using two identifiers.  Patient Location: Home  Provider Location: Home Office  I discussed the limitations of evaluation and management by telemedicine. The patient expressed understanding and agreed to proceed.  Patient Medicare AWV questionnaire was completed by the patient on 08/25/2022; I have confirmed that all information answered by patient is correct and no changes  since this date.  Per patient no change in vitals since last visit; unable to obtain new vitals due to this being a telehealth visit.  Review of Systems    Cardiac Risk Factors include: advanced age (>42men, >23 women);male gender;Other (see comment), Risk factor comments: Coronary atherosclerosis, Aortic aneyrysm     Objective:    Today's Vitals   08/29/22 0949  Weight: 173 lb (78.5 kg)  Height: 5\' 7"  (1.702 m)   Body mass index is 27.1 kg/m.     08/29/2022    9:58 AM 04/06/2021    2:19 PM 03/29/2020   11:15 AM 08/29/2019    9:31 PM 08/29/2019    9:30 AM 01/30/2018   10:01 PM 03/29/2016    2:42 PM  Advanced Directives  Does Patient Have a Medical Advance Directive? Yes Yes Yes Yes No Yes Yes  Type of Estate agent of [place];Living will Living will;Healthcare Power of [employer] Power of [place];Living will  Out of facility DNR (pink MOST or yellow form) Healthcare Power of [place];Living will  Does patient want to make changes to medical advance directive?  No - Patient declined No - Patient declined No - Patient declined     Copy of Healthcare Power of Attorney in Chart? No - copy requested No - copy requested  No - copy requested   No - copy requested  Would patient like information on  creating a medical advance directive?    No - Patient declined No - Patient declined      Current Medications (verified) Outpatient Encounter Medications as of 08/29/2022  Medication Sig   amitriptyline (ELAVIL) 25 MG tablet TAKE 2 TABLETS AT BEDTIME AS NEEDED FOR SLEEP   aspirin EC 81 MG tablet Take 1 tablet (81 mg total) by mouth daily.   baclofen (LIORESAL) 20 MG tablet TAKE 1 TABLET FOUR TIMES A DAY AS NEEDED FOR MUSCLE SPASMS   cetirizine (ZYRTEC) 10 MG tablet Take 10 mg by mouth daily as needed for allergies.   cholecalciferol (VITAMIN D) 1000 units tablet Take 1,000 Units by mouth daily.   clotrimazole-betamethasone (LOTRISONE) cream APPLY 1 APPLICATION TOPICALLY TWICE A DAY AS NEEDED FOR IRRITATION   fluticasone (FLONASE) 50 MCG/ACT nasal spray Place 2 sprays into both nostrils daily as needed for allergies or rhinitis.   furosemide (LASIX) 20 MG tablet Take 1 tablet (20 mg total) by mouth daily.   gabapentin (NEURONTIN) 100 MG capsule TAKE 2 CAPSULES THREE TIMES A DAY AS NEEDED FOR PAIN   HYDROcodone bit-homatropine (HYCODAN) 5-1.5 MG/5ML syrup Take 5 mLs by mouth at bedtime as needed for cough.   Multiple Vitamin (MULTIVITAMIN) capsule Take 1 tablet by mouth daily.   pantoprazole (PROTONIX) 40 MG tablet TAKE 1 TABLET DAILY   Probiotic Product (ALIGN) 4 MG CAPS Take 1 capsule (4 mg total) by mouth daily.   tadalafil (CIALIS) 5  MG tablet TAKE 1 TABLET DAILY   TraMADol HCl 200 MG CP24 Take 1 capsule by mouth daily.   No facility-administered encounter medications on file as of 08/29/2022.    Allergies (verified) Meperidine, Ocrevus [ocrelizumab], Penicillin g, and Prednisone   History: Past Medical History:  Diagnosis Date   Arthritis    Asthma    C. difficile diarrhea    Gallstone pancreatitis    Hernia    History of inguinal hernia repair    Low back pain    MS (multiple sclerosis) (HCC)    Pancreatitis hosp[italized with gallstone pancreatitis 3/12[   Vitamin D  deficiency    Past Surgical History:  Procedure Laterality Date   BIOPSY  09/02/2019   Procedure: BIOPSY;  Surgeon: Meryl Dare, MD;  Location: [facility];  Service: Endoscopy;;   CHOLECYSTECTOMY  04/2010   COLONOSCOPY WITH PROPOFOL N/A 09/02/2019   Procedure: COLONOSCOPY WITH PROPOFOL;  Surgeon: Meryl Dare, MD;  Location: [facility];  Service: Endoscopy;  Laterality: N/A;   CYSTECTOMY     on back and R hand   CYSTECTOMY     left arm   HERNIA REPAIR     x 2   INGUINAL HERNIA REPAIR  03/17/2011   Procedure: HERNIA REPAIR INGUINAL ADULT;  Surgeon: [name], MD;  Location: [facility];  Service: General;  Laterality: Left;  Open left inguinal hernia repair with mesh   KNEE ARTHROSCOPY     right   NOSE SURGERY     POLYPECTOMY  09/02/2019   Procedure: POLYPECTOMY;  Surgeon: Meryl Dare, MD;  Location: [facility];  Service: Endoscopy;;   TONSILLECTOMY     WISDOM TOOTH EXTRACTION     Family History  Problem Relation Age of Onset   Lung cancer Father        not related to smoking   Diabetes Mother    Colon cancer Neg Hx    Esophageal cancer Neg Hx    Pancreatic cancer Neg Hx    Stomach cancer Neg Hx    Liver disease Neg Hx    Social History   Socioeconomic History   Marital status: Married    Spouse name: [name]   Number of children: 2   Years of education: Not on file   Highest education level: Associate degree: occupational, Scientist, product/process development, or vocational program  Occupational History   Occupation: Water quality scientist: [employer]  Tobacco Use   Smoking status: Never   Smokeless tobacco: Never  Vaping Use   Vaping status: Never Used  Substance and Sexual Activity   Alcohol use: No   Drug use: No   Sexual activity: Yes  Other Topics Concern   Not on file  Social History Narrative   Not on file   Social Determinants of Health   Financial Resource Strain: Low Risk  (08/29/2022)   Overall Financial Resource Strain (CARDIA)     Difficulty of Paying Living Expenses: Not hard at all  Food Insecurity: No Food Insecurity (08/29/2022)   Hunger Vital Sign    Worried About Running Out of Food in the Last Year: Never true    Ran Out of Food in the Last Year: Never true  Transportation Needs: No Transportation Needs (08/29/2022)   PRAPARE - Administrator, Civil Service (Medical): No    Lack of Transportation (Non-Medical): No  Physical Activity: Unknown (08/29/2022)   Exercise Vital Sign    Days of Exercise per Week:  Patient declined    Minutes of Exercise per Session: 30 min  Stress: No Stress Concern Present (08/29/2022)   [employer] of Occupational Health - Occupational Stress Questionnaire    Feeling of Stress : Not at all  Social Connections: Socially Isolated (08/29/2022)   Social Connection and Isolation Panel [NHANES]    Frequency of Communication with Friends and Family: Once a week    Frequency of Social Gatherings with Friends and Family: Once a week    Attends Religious Services: Never    Database administrator or Organizations: No    Attends Engineer, structural: Never    Marital Status: Married    Tobacco Counseling Counseling given: Not Answered   Clinical Intake:  Pre-visit preparation completed: Yes  Pain : No/denies pain     BMI - recorded: 27.1 Nutritional Status: BMI 25 -29 Overweight Diabetes: No  How often do you need to have someone help you when you read instructions, pamphlets, or other written materials from your doctor or pharmacy?: 1 - Never  Interpreter Needed?: No  Information entered by :: Alanie Syler, RMA   Activities of Daily Living    08/29/2022    9:53 AM 08/25/2022   11:46 AM  In your present state of health, do you have any difficulty performing the following activities:  Hearing? 0 0  Vision? 0 0  Difficulty concentrating or making decisions? 0 0  Walking or climbing stairs? 1 1  Dressing or bathing? 0 0  Doing errands, shopping?  0 0  Preparing Food and eating ? N N  Using the Toilet? N N  In the past six months, have you accidently leaked urine? N N  Do you have problems with loss of bowel control? N N  Managing your Medications? N N  Managing your Finances? N N  Housekeeping or managing your Housekeeping? N N    Patient Care Team: Plotnikov, Georgina Quint, MD as PCP - Fuller Mandril, MD as Referring Physician (Neurology) Rachael Fee, MD as Attending Physician (Gastroenterology) Ernesto Rutherford, MD as Consulting Physician (Ophthalmology) August Saucer Corrie Mckusick, MD as Consulting Physician (Orthopedic Surgery)  Indicate any recent Medical Services you may have received from other than [facility] providers in the past year (date may be approximate).     Assessment:   This is a routine wellness examination for Hiram.  Hearing/Vision screen Hearing Screening - Comments:: Denies hearing difficulties Per patient has tinnitus in both ears.   Dietary issues and exercise activities discussed:     Goals Addressed               This Visit's Progress     maintain current health status (pt-stated)   On track     Continue to be as active as possible and eat a healthy diet.  Since immobility, MS,  does not get the exercise like I used to.      Depression Screen    08/29/2022   10:00 AM 06/27/2022    8:27 AM 05/01/2022    2:17 PM 03/31/2022    1:06 PM 10/07/2021    2:02 PM 09/07/2021    3:36 PM 04/06/2021    1:18 PM  PHQ 2/9 Scores  PHQ - 2 Score 0 0 0 0  0 0  PHQ- 9 Score 0  0 0     Exception Documentation     Patient refusal      Fall Risk    08/29/2022    9:58 AM  08/25/2022   11:46 AM 06/27/2022    8:26 AM 05/01/2022    2:16 PM 03/31/2022    1:07 PM  Fall Risk   Falls in the past year? 0 0 0 0 0  Number falls in past yr: 0 0 0 0 0  Injury with Fall? 0 0 0 0 0  Risk for fall due to : No Fall Risks  No Fall Risks Impaired balance/gait;Impaired mobility No Fall Risks;Impaired balance/gait  Follow up Falls  prevention discussed  Falls evaluation completed Falls evaluation completed Falls evaluation completed    MEDICARE RISK AT HOME:   TIMED UP AND GO:  Was the test performed?  No    Cognitive Function:        08/29/2022    9:58 AM  6CIT Screen  What Year? 0 points  What month? 0 points  What time? 0 points  Count back from 20 0 points  Months in reverse 0 points  Repeat phrase 0 points  Total Score 0 points    Immunizations Immunization History  Administered Date(s) Administered   Influenza Split 01/24/2011   Influenza Whole 11/07/2011   Influenza,inj,Quad PF,6+ Mos 11/15/2016, 11/14/2017, 11/16/2018, 12/08/2019   PFIZER(Purple Top)SARS-COV-2 Vaccination 04/24/2019, 05/20/2019, 09/29/2019   Pneumococcal Conjugate-13 03/27/2014   Pneumococcal Polysaccharide-23 06/14/2009, 07/06/2020   Tdap 09/04/2012   Zoster, Live 03/29/2015    TDAP status: Due, Education has been provided regarding the importance of this vaccine. Advised may receive this vaccine at local pharmacy or Health Dept. Aware to provide a copy of the vaccination record if obtained from local pharmacy or Health Dept. Verbalized acceptance and understanding.  Flu Vaccine status: Due, Education has been provided regarding the importance of this vaccine. Advised may receive this vaccine at local pharmacy or Health Dept. Aware to provide a copy of the vaccination record if obtained from local pharmacy or Health Dept. Verbalized acceptance and understanding.  Pneumococcal vaccine status: Up to date  Covid-19 vaccine status: Completed vaccines  Qualifies for Shingles Vaccine? Yes   Zostavax completed Yes   Shingrix Completed?: No.    Education has been provided regarding the importance of this vaccine. Patient has been advised to call insurance company to determine out of pocket expense if they have not yet received this vaccine. Advised may also receive vaccine at local pharmacy or Health Dept. Verbalized acceptance  and understanding.  Screening Tests Health Maintenance  Topic Date Due   Colonoscopy  09/01/2020   COVID-19 Vaccine (4 - 2023-24 season) 10/07/2021   DTaP/Tdap/Td (2 - Td or Tdap) 09/05/2022   INFLUENZA VACCINE  09/07/2022   Medicare Annual Wellness (AWV)  08/29/2023   Pneumonia Vaccine 69+ Years old  Completed   Hepatitis C Screening  Completed   HPV VACCINES  Aged Out   Fecal DNA (Cologuard)  Discontinued   Zoster Vaccines- Shingrix  Discontinued    Health Maintenance  Health Maintenance Due  Topic Date Due   Colonoscopy  09/01/2020   COVID-19 Vaccine (4 - 2023-24 season) 10/07/2021    Colorectal cancer screening: Referral to GI placed 08/29/2022. Pt aware the office will call re: appt.  Lung Cancer Screening: (Low Dose CT Chest recommended if Age 13-80 years, 20 pack-year currently smoking OR have quit w/in 15years.) does not qualify.   Lung Cancer Screening Referral: N/A  Additional Screening:  Hepatitis C Screening: does not qualify; Completed 02/19/2018  Vision Screening: Recommended annual ophthalmology exams for early detection of glaucoma and other disorders of the eye. Is the  patient up to date with their annual eye exam?  Yes  Who is the provider or what is the name of the office in which the patient attends annual eye exams? [facility] If pt is not established with a provider, would they like to be referred to a provider to establish care? No .   Dental Screening: Recommended annual dental exams for proper oral hygiene  Community Resource Referral / Chronic Care Management: CRR required this visit?  No   CCM required this visit?  No     Plan:     I have personally reviewed and noted the following in the patient's chart:   Medical and social history Use of alcohol, tobacco or illicit drugs  Current medications and supplements including opioid prescriptions. Patient is not currently taking opioid prescriptions. Functional ability and status Nutritional  status Physical activity Advanced directives List of other physicians Hospitalizations, surgeries, and ER visits in previous 12 months Vitals Screenings to include cognitive, depression, and falls Referrals and appointments  In addition, I have reviewed and discussed with patient certain preventive protocols, quality metrics, and best practice recommendations. A written personalized care plan for preventive services as well as general preventive health recommendations were provided to patient.     Carmilla Granville L Darletta Noblett, CMA   08/29/2022   After Visit Summary: (MyChart) Due to this being a telephonic visit, the after visit summary with patients personalized plan was offered to patient via MyChart   Nurse Notes: Patient is over due for an colonoscopy, as he is to repeat every year.  Last one performed in 2021.  A referral has been placed today.     Medical screening examination/treatment/procedure(s) were performed by non-physician practitioner and as supervising physician I was immediately available for consultation/collaboration.  I agree with above. Jacinta Shoe, MD

## 2022-08-29 NOTE — Patient Instructions (Signed)
Medication Instructions:  Start Atorvastatin 10 mg daily after dinner Continue all other medications  *If you need a refill on your cardiac medications before your next appointment, please call your pharmacy*   Lab Work: None ordered   Testing/Procedures: Schedule left lower leg venous doppler   Follow-Up: At [facility], you and your health needs are our priority.  As part of our continuing mission to provide you with exceptional heart care, we have created designated Provider Care Teams.  These Care Teams include your primary Cardiologist (physician) and Advanced Practice Providers (APPs -  Physician Assistants and Nurse Practitioners) who all work together to provide you with the care you need, when you need it.  We recommend signing up for the patient portal called "MyChart".  Sign up information is provided on this After Visit Summary.  MyChart is used to connect with patients for Virtual Visits (Telemedicine).  Patients are able to view lab/test results, encounter notes, upcoming appointments, etc.  Non-urgent messages can be sent to your provider as well.   To learn more about what you can do with MyChart, go to [email or web address].    Your next appointment:  6 months    Call in Sept to schedule Jan appointment     Provider:  Dr.Schumann

## 2022-08-29 NOTE — Patient Instructions (Addendum)
[name] , Thank you for taking time to come for your Medicare Wellness Visit. I appreciate your ongoing commitment to your health goals. Please review the following plan we discussed and let me know if I can assist you in the future.   These are the goals we discussed:  Goals       maintain current health status (pt-stated)      Continue to be as active as possible and eat a healthy diet.  Since immobility, MS,  does not get the exercise like I used to.        This is a list of the screening recommended for you and due dates:  Health Maintenance  Topic Date Due   Colon Cancer Screening  09/01/2020   COVID-19 Vaccine (4 - 2023-24 season) 10/07/2021   DTaP/Tdap/Td vaccine (2 - Td or Tdap) 09/05/2022   Flu Shot  09/07/2022   Medicare Annual Wellness Visit  08/29/2023   Pneumonia Vaccine  Completed   Hepatitis C Screening  Completed   HPV Vaccine  Aged Out   Cologuard (Stool DNA test)  Discontinued   Zoster (Shingles) Vaccine  Discontinued    Advanced directives: Please bring a copy of your health care power of attorney and living will to the office to be added to your chart at your convenience.   Conditions/risks identified: You are doing great. Each day, aim for 6 glasses of water, plenty of protein in your diet and try to get up and walk/ stretch every hour for 5-10 minutes at a time.  Remember to call [facility] to schedule for colonoscopy.  Next appointment: Follow up in one year for your annual wellness visit.   Preventive Care 68 Years and Older, Male  Preventive care refers to lifestyle choices and visits with your health care provider that can promote health and wellness. What does preventive care include? A yearly physical exam. This is also called an annual well check. Dental exams once or twice a year. Routine eye exams. Ask your health care provider how often you should have your eyes checked. Personal lifestyle choices, including: Daily care of your teeth and  gums. Regular physical activity. Eating a healthy diet. Avoiding tobacco and drug use. Limiting alcohol use. Practicing safe sex. Taking low doses of aspirin every day. Taking vitamin and mineral supplements as recommended by your health care provider. What happens during an annual well check? The services and screenings done by your health care provider during your annual well check will depend on your age, overall health, lifestyle risk factors, and family history of disease. Counseling  Your health care provider may ask you questions about your: Alcohol use. Tobacco use. Drug use. Emotional well-being. Home and relationship well-being. Sexual activity. Eating habits. History of falls. Memory and ability to understand (cognition). Work and work Astronomer. Screening  You may have the following tests or measurements: Height, weight, and BMI. Blood pressure. Lipid and cholesterol levels. These may be checked every 5 years, or more frequently if you are over 38 years old. Skin check. Lung cancer screening. You may have this screening every year starting at age 33 if you have a 30-pack-year history of smoking and currently smoke or have quit within the past 15 years. Fecal occult blood test (FOBT) of the stool. You may have this test every year starting at age 30. Flexible sigmoidoscopy or colonoscopy. You may have a sigmoidoscopy every 5 years or a colonoscopy every 10 years starting at age 71. Prostate cancer  screening. Recommendations will vary depending on your family history and other risks. Hepatitis C blood test. Hepatitis B blood test. Sexually transmitted disease (STD) testing. Diabetes screening. This is done by checking your blood sugar (glucose) after you have not eaten for a while (fasting). You may have this done every 1-3 years. Abdominal aortic aneurysm (AAA) screening. You may need this if you are a current or former smoker. Osteoporosis. You may be screened  starting at age 29 if you are at high risk. Talk with your health care provider about your test results, treatment options, and if necessary, the need for more tests. Vaccines  Your health care provider may recommend certain vaccines, such as: Influenza vaccine. This is recommended every year. Tetanus, diphtheria, and acellular pertussis (Tdap, Td) vaccine. You may need a Td booster every 10 years. Zoster vaccine. You may need this after age 78. Pneumococcal 13-valent conjugate (PCV13) vaccine. One dose is recommended after age 36. Pneumococcal polysaccharide (PPSV23) vaccine. One dose is recommended after age 76. Talk to your health care provider about which screenings and vaccines you need and how often you need them. This information is not intended to replace advice given to you by your health care provider. Make sure you discuss any questions you have with your health care provider. Document Released: 02/19/2015 Document Revised: 10/13/2015 Document Reviewed: 11/24/2014 Elsevier Interactive Patient Education  2017 [employer].  Fall Prevention in the Home Falls can cause injuries. They can happen to people of all ages. There are many things you can do to make your home safe and to help prevent falls. What can I do on the outside of my home? Regularly fix the edges of walkways and driveways and fix any cracks. Remove anything that might make you trip as you walk through a door, such as a raised step or threshold. Trim any bushes or trees on the path to your home. Use bright outdoor lighting. Clear any walking paths of anything that might make someone trip, such as rocks or tools. Regularly check to see if handrails are loose or broken. Make sure that both sides of any steps have handrails. Any raised decks and porches should have guardrails on the edges. Have any leaves, snow, or ice cleared regularly. Use sand or salt on walking paths during winter. Clean up any spills in your garage  right away. This includes oil or grease spills. What can I do in the bathroom? Use night lights. Install grab bars by the toilet and in the tub and shower. Do not use towel bars as grab bars. Use non-skid mats or decals in the tub or shower. If you need to sit down in the shower, use a plastic, non-slip stool. Keep the floor dry. Clean up any water that spills on the floor as soon as it happens. Remove soap buildup in the tub or shower regularly. Attach bath mats securely with double-sided non-slip rug tape. Do not have throw rugs and other things on the floor that can make you trip. What can I do in the bedroom? Use night lights. Make sure that you have a light by your bed that is easy to reach. Do not use any sheets or blankets that are too big for your bed. They should not hang down onto the floor. Have a firm chair that has side arms. You can use this for support while you get dressed. Do not have throw rugs and other things on the floor that can make you trip. What can  I do in the kitchen? Clean up any spills right away. Avoid walking on wet floors. Keep items that you use a lot in easy-to-reach places. If you need to reach something above you, use a strong step stool that has a grab bar. Keep electrical cords out of the way. Do not use floor polish or wax that makes floors slippery. If you must use wax, use non-skid floor wax. Do not have throw rugs and other things on the floor that can make you trip. What can I do with my stairs? Do not leave any items on the stairs. Make sure that there are handrails on both sides of the stairs and use them. Fix handrails that are broken or loose. Make sure that handrails are as long as the stairways. Check any carpeting to make sure that it is firmly attached to the stairs. Fix any carpet that is loose or worn. Avoid having throw rugs at the top or bottom of the stairs. If you do have throw rugs, attach them to the floor with carpet tape. Make  sure that you have a light switch at the top of the stairs and the bottom of the stairs. If you do not have them, ask someone to add them for you. What else can I do to help prevent falls? Wear shoes that: Do not have high heels. Have rubber bottoms. Are comfortable and fit you well. Are closed at the toe. Do not wear sandals. If you use a stepladder: Make sure that it is fully opened. Do not climb a closed stepladder. Make sure that both sides of the stepladder are locked into place. Ask someone to hold it for you, if possible. Clearly mark and make sure that you can see: Any grab bars or handrails. First and last steps. Where the edge of each step is. Use tools that help you move around (mobility aids) if they are needed. These include: Canes. Walkers. Scooters. Crutches. Turn on the lights when you go into a dark area. Replace any light bulbs as soon as they burn out. Set up your furniture so you have a clear path. Avoid moving your furniture around. If any of your floors are uneven, fix them. If there are any pets around you, be aware of where they are. Review your medicines with your doctor. Some medicines can make you feel dizzy. This can increase your chance of falling. Ask your doctor what other things that you can do to help prevent falls. This information is not intended to replace advice given to you by your health care provider. Make sure you discuss any questions you have with your health care provider. Document Released: 11/19/2008 Document Revised: 07/01/2015 Document Reviewed: 02/27/2014 Elsevier Interactive Patient Education  2017 [employer].

## 2022-08-30 ENCOUNTER — Other Ambulatory Visit: Payer: Self-pay | Admitting: Internal Medicine

## 2022-09-05 ENCOUNTER — Ambulatory Visit (HOSPITAL_COMMUNITY)
Admission: RE | Admit: 2022-09-05 | Discharge: 2022-09-05 | Disposition: A | Discharge status: Home / Self Care | Payer: Medicare Other | Source: Ambulatory Visit | Attending: Cardiology | Admitting: Cardiology

## 2022-09-05 DIAGNOSIS — M7989 Other specified soft tissue disorders: Secondary | ICD-10-CM

## 2022-09-05 DIAGNOSIS — I2510 Atherosclerotic heart disease of native coronary artery without angina pectoris: Secondary | ICD-10-CM

## 2022-09-21 ENCOUNTER — Encounter (INDEPENDENT_AMBULATORY_CARE_PROVIDER_SITE_OTHER): Payer: Self-pay

## 2022-10-20 ENCOUNTER — Other Ambulatory Visit: Payer: Self-pay | Admitting: Internal Medicine

## 2022-11-01 ENCOUNTER — Ambulatory Visit: Payer: Medicare Other | Admitting: Internal Medicine

## 2022-11-01 ENCOUNTER — Encounter: Payer: Self-pay | Admitting: Internal Medicine

## 2022-11-01 VITALS — BP 120/80 | HR 96 | Temp 98.2°F | Ht 67.0 in | Wt 171.0 lb

## 2022-11-01 DIAGNOSIS — E291 Testicular hypofunction: Secondary | ICD-10-CM | POA: Insufficient documentation

## 2022-11-01 DIAGNOSIS — R202 Paresthesia of skin: Secondary | ICD-10-CM

## 2022-11-01 DIAGNOSIS — Z0000 Encounter for general adult medical examination without abnormal findings: Secondary | ICD-10-CM

## 2022-11-01 DIAGNOSIS — G35 Multiple sclerosis: Secondary | ICD-10-CM

## 2022-11-01 DIAGNOSIS — Z8619 Personal history of other infectious and parasitic diseases: Secondary | ICD-10-CM

## 2022-11-01 DIAGNOSIS — G43909 Migraine, unspecified, not intractable, without status migrainosus: Secondary | ICD-10-CM

## 2022-11-01 DIAGNOSIS — M6281 Muscle weakness (generalized): Secondary | ICD-10-CM

## 2022-11-01 DIAGNOSIS — G8220 Paraplegia, unspecified: Secondary | ICD-10-CM

## 2022-11-01 DIAGNOSIS — E785 Hyperlipidemia, unspecified: Secondary | ICD-10-CM | POA: Diagnosis not present

## 2022-11-01 DIAGNOSIS — I7121 Aneurysm of the ascending aorta, without rupture: Secondary | ICD-10-CM | POA: Diagnosis not present

## 2022-11-01 LAB — CBC WITH DIFFERENTIAL/PLATELET
Basophils Absolute: 0 10*3/uL (ref 0.0–0.1)
Basophils Relative: 0.7 % (ref 0.0–3.0)
Eosinophils Absolute: 0.1 10*3/uL (ref 0.0–0.7)
Eosinophils Relative: 1.5 % (ref 0.0–5.0)
HCT: 45.7 % (ref 39.0–52.0)
Hemoglobin: 15 g/dL (ref 13.0–17.0)
Lymphocytes Relative: 24.8 % (ref 12.0–46.0)
Lymphs Abs: 1.4 10*3/uL (ref 0.7–4.0)
MCHC: 32.8 g/dL (ref 30.0–36.0)
MCV: 94.2 fl (ref 78.0–100.0)
Monocytes Absolute: 0.5 10*3/uL (ref 0.1–1.0)
Monocytes Relative: 8.9 % (ref 3.0–12.0)
Neutro Abs: 3.7 10*3/uL (ref 1.4–7.7)
Neutrophils Relative %: 64.1 % (ref 43.0–77.0)
Platelets: 158 10*3/uL (ref 150.0–400.0)
RBC: 4.85 Mil/uL (ref 4.22–5.81)
RDW: 12.4 % (ref 11.5–15.5)
WBC: 5.8 10*3/uL (ref 4.0–10.5)

## 2022-11-01 LAB — LIPID PANEL
Cholesterol: 121 mg/dL (ref 0–200)
HDL: 53.4 mg/dL (ref >39.00)
LDL Cholesterol: 55 mg/dL (ref 0–99)
NonHDL: 67.74
Total CHOL/HDL Ratio: 2
Triglycerides: 65 mg/dL (ref 0.0–149.0)
VLDL: 13 mg/dL (ref 0.0–40.0)

## 2022-11-01 LAB — COMPREHENSIVE METABOLIC PANEL
ALT: 28 U/L (ref 0–53)
AST: 27 U/L (ref 0–37)
Albumin: 4.2 g/dL (ref 3.5–5.2)
Alkaline Phosphatase: 74 U/L (ref 39–117)
BUN: 15 mg/dL (ref 6–23)
CO2: 29 mEq/L (ref 19–32)
Calcium: 9.1 mg/dL (ref 8.4–10.5)
Chloride: 103 mEq/L (ref 96–112)
Creatinine, Ser: 0.82 mg/dL (ref 0.40–1.50)
GFR: 90.74 mL/min (ref >60.00)
Glucose, Bld: 97 mg/dL (ref 70–99)
Potassium: 4.1 mEq/L (ref 3.5–5.1)
Sodium: 139 mEq/L (ref 135–145)
Total Bilirubin: 0.5 mg/dL (ref 0.2–1.2)
Total Protein: 6.9 g/dL (ref 6.0–8.3)

## 2022-11-01 LAB — VITAMIN B12: Vitamin B-12: 548 pg/mL (ref 211–911)

## 2022-11-01 LAB — TESTOSTERONE: Testosterone: 259.44 ng/dL — ABNORMAL LOW (ref 300.00–890.00)

## 2022-11-01 LAB — TSH: TSH: 2.44 u[IU]/mL (ref 0.35–5.50)

## 2022-11-01 MED ORDER — BACLOFEN 20 MG PO TABS: ORAL_TABLET | ORAL | 3 refills | Status: DC

## 2022-11-01 MED ORDER — TRAMADOL HCL (ER BIPHASIC) 200 MG PO CP24: 1.0000 | ORAL_CAPSULE | Freq: Every day | ORAL | 1 refills | Status: DC

## 2022-11-01 MED ORDER — PANTOPRAZOLE SODIUM 40 MG PO TBEC: 40.0000 mg | DELAYED_RELEASE_TABLET | Freq: Every day | ORAL | 3 refills | Status: DC

## 2022-11-01 MED ORDER — KETOROLAC TROMETHAMINE 10 MG PO TABS
10.0000 mg | ORAL_TABLET | Freq: Three times a day (TID) | ORAL | 1 refills | Status: AC | PRN
Start: 2022-11-01 — End: ?

## 2022-11-01 MED ORDER — GABAPENTIN 100 MG PO CAPS: 100.0000 mg | ORAL_CAPSULE | Freq: Three times a day (TID) | ORAL | 1 refills | Status: DC

## 2022-11-01 NOTE — Assessment & Plan Note (Signed)
2024 Coronary calcium CT - Total Agatston score: 56.5  On ASA, Lipitor

## 2022-11-01 NOTE — Assessment & Plan Note (Signed)
Ascending aortic aneurysmal 4 cm.  Yearly checks with CT or MRI

## 2022-11-01 NOTE — Assessment & Plan Note (Addendum)
100% disaility [facility] In a power w/c  Tramadol prn

## 2022-11-01 NOTE — Progress Notes (Signed)
Subjective:  Patient ID: [name], male    DOB: [birth year]  Age: 67 y.o. MRN: [id number]  CC: Follow-up (6 [facility] F/U)   HPI [name] presents for MS, dyslipidemia, weakness Wife Almira Coaster has PE, CHF, a lot of bone fractures, lupus   Outpatient Medications Prior to Visit  Medication Sig Dispense Refill   amitriptyline (ELAVIL) 25 MG tablet TAKE 2 TABLETS AT BEDTIME AS NEEDED FOR SLEEP 180 tablet 3   aspirin EC 81 MG tablet Take 1 tablet (81 mg total) by mouth daily. 100 tablet 3   atorvastatin (LIPITOR) 10 MG tablet Take 1 tablet (10 mg total) by mouth daily. 90 tablet 3   cetirizine (ZYRTEC) 10 MG tablet Take 10 mg by mouth daily as needed for allergies.     cholecalciferol (VITAMIN D) 1000 units tablet Take 1,000 Units by mouth daily.     clotrimazole-betamethasone (LOTRISONE) cream APPLY 1 APPLICATION TOPICALLY TWICE A DAY AS NEEDED FOR IRRITATION 30 g 2   fluticasone (FLONASE) 50 MCG/ACT nasal spray Place 2 sprays into both nostrils daily as needed for allergies or rhinitis.     furosemide (LASIX) 20 MG tablet Take 1 tablet (20 mg total) by mouth daily. 30 tablet 3   HYDROcodone bit-homatropine (HYCODAN) 5-1.5 MG/5ML syrup Take 5 mLs by mouth at bedtime as needed for cough. 120 mL 0   Multiple Vitamin (MULTIVITAMIN) capsule Take 1 tablet by mouth daily.     Probiotic Product (ALIGN) 4 MG CAPS Take 1 capsule (4 mg total) by mouth daily. 90 capsule 3   tadalafil (CIALIS) 5 MG tablet TAKE 1 TABLET DAILY 90 tablet 2   baclofen (LIORESAL) 20 MG tablet TAKE 1 TABLET FOUR TIMES A DAY AS NEEDED FOR MUSCLE SPASMS 360 tablet 3   gabapentin (NEURONTIN) 100 MG capsule TAKE 2 CAPSULES THREE TIMES A DAY AS NEEDED FOR PAIN 360 capsule 1   pantoprazole (PROTONIX) 40 MG tablet Take 1 tablet (40 mg total) by mouth daily. Annual appt due in Sept must see provider for future refills 90 tablet 0   TraMADol HCl 200 MG CP24 Take 1 capsule by mouth daily. 90 capsule 1   No facility-administered  medications prior to visit.    ROS: Review of Systems  Constitutional:  Negative for appetite change, fatigue and unexpected weight change.  HENT:  Negative for congestion, nosebleeds, sneezing, sore throat and trouble swallowing.   Eyes:  Negative for itching and visual disturbance.  Respiratory:  Negative for cough.   Cardiovascular:  Negative for chest pain, palpitations and leg swelling.  Gastrointestinal:  Negative for abdominal distention, blood in stool, diarrhea and nausea.  Genitourinary:  Negative for frequency and hematuria.  Musculoskeletal:  Positive for arthralgias, back pain and gait problem. Negative for joint swelling and neck pain.  Skin:  Negative for rash.  Neurological:  Negative for dizziness, tremors, speech difficulty and weakness.  Hematological:  Does not bruise/bleed easily.  Psychiatric/Behavioral:  Negative for agitation, dysphoric mood, sleep disturbance and suicidal ideas. The patient is not nervous/anxious.     Objective:  BP 120/80 (BP Location: Left Arm, Patient Position: Sitting, Cuff Size: Normal)   Pulse 96   Temp 98.2 F (36.8 C) (Oral)   Ht 5\' 7"  (1.702 m)   Wt 171 lb (77.6 kg)   SpO2 96%   BMI 26.78 kg/m   BP Readings from Last 3 Encounters:  11/01/22 120/80  08/29/22 138/85  07/18/22 124/78    Wt Readings from Last 3 Encounters:  Date of Birth: March 24, 1955     Patient Gender: M Patient Age:   27 years Exam Location:  [facility] Procedure:      VAS [place] LOWER EXTREMITY VENOUS (DVT) Referring Phys: Epifanio Lesches --------------------------------------------------------------------------------  Indications: Patient has multiple sclerosis with left leg swelling. He denies chest pain and SOB.  Limitations: Body habitus. Comparison Study: None Performing Technologist: Alecia Mackin RVT, RDCS (AE), RDMS  Examination Guidelines: A complete evaluation includes B-mode imaging, spectral Doppler, color Doppler, and power Doppler as needed of all accessible portions of each vessel. Bilateral testing is considered an integral part of a complete examination. Limited examinations for reoccurring indications may be performed as noted. The reflux portion of the exam is performed with the patient in reverse Trendelenburg.  +-----+---------------+---------+-----------+----------+--------------+ RIGHTCompressibilityPhasicitySpontaneityPropertiesThrombus Aging +-----+---------------+---------+-----------+----------+--------------+ CFV  Full           Yes      Yes                                 +-----+---------------+---------+-----------+----------+--------------+   +-------+---------------+---------+-----------+----------+--------------+ LEFT   CompressibilityPhasicitySpontaneityPropertiesThrombus Aging +-------+---------------+---------+-----------+----------+--------------+ CFV    Full           Yes      Yes                                  +-------+---------------+---------+-----------+----------+--------------+ SFJ    Full           Yes      Yes                                 +-------+---------------+---------+-----------+----------+--------------+ FV ProxFull           Yes      Yes                                 +-------+---------------+---------+-----------+----------+--------------+ FV Mid Full           Yes      Yes                                 +-------+---------------+---------+-----------+----------+--------------+ PFV    Full                                                        +-------+---------------+---------+-----------+----------+--------------+ POP    Full           Yes      Yes                                 +-------+---------------+---------+-----------+----------+--------------+ PTV    Full           Yes      Yes                                 +-------+---------------+---------+-----------+----------+--------------+ PERO   Full           Yes  Subjective:  Patient ID: [name], male    DOB: [birth year]  Age: 67 y.o. MRN: [id number]  CC: Follow-up (6 [facility] F/U)   HPI [name] presents for MS, dyslipidemia, weakness Wife Almira Coaster has PE, CHF, a lot of bone fractures, lupus   Outpatient Medications Prior to Visit  Medication Sig Dispense Refill   amitriptyline (ELAVIL) 25 MG tablet TAKE 2 TABLETS AT BEDTIME AS NEEDED FOR SLEEP 180 tablet 3   aspirin EC 81 MG tablet Take 1 tablet (81 mg total) by mouth daily. 100 tablet 3   atorvastatin (LIPITOR) 10 MG tablet Take 1 tablet (10 mg total) by mouth daily. 90 tablet 3   cetirizine (ZYRTEC) 10 MG tablet Take 10 mg by mouth daily as needed for allergies.     cholecalciferol (VITAMIN D) 1000 units tablet Take 1,000 Units by mouth daily.     clotrimazole-betamethasone (LOTRISONE) cream APPLY 1 APPLICATION TOPICALLY TWICE A DAY AS NEEDED FOR IRRITATION 30 g 2   fluticasone (FLONASE) 50 MCG/ACT nasal spray Place 2 sprays into both nostrils daily as needed for allergies or rhinitis.     furosemide (LASIX) 20 MG tablet Take 1 tablet (20 mg total) by mouth daily. 30 tablet 3   HYDROcodone bit-homatropine (HYCODAN) 5-1.5 MG/5ML syrup Take 5 mLs by mouth at bedtime as needed for cough. 120 mL 0   Multiple Vitamin (MULTIVITAMIN) capsule Take 1 tablet by mouth daily.     Probiotic Product (ALIGN) 4 MG CAPS Take 1 capsule (4 mg total) by mouth daily. 90 capsule 3   tadalafil (CIALIS) 5 MG tablet TAKE 1 TABLET DAILY 90 tablet 2   baclofen (LIORESAL) 20 MG tablet TAKE 1 TABLET FOUR TIMES A DAY AS NEEDED FOR MUSCLE SPASMS 360 tablet 3   gabapentin (NEURONTIN) 100 MG capsule TAKE 2 CAPSULES THREE TIMES A DAY AS NEEDED FOR PAIN 360 capsule 1   pantoprazole (PROTONIX) 40 MG tablet Take 1 tablet (40 mg total) by mouth daily. Annual appt due in Sept must see provider for future refills 90 tablet 0   TraMADol HCl 200 MG CP24 Take 1 capsule by mouth daily. 90 capsule 1   No facility-administered  medications prior to visit.    ROS: Review of Systems  Constitutional:  Negative for appetite change, fatigue and unexpected weight change.  HENT:  Negative for congestion, nosebleeds, sneezing, sore throat and trouble swallowing.   Eyes:  Negative for itching and visual disturbance.  Respiratory:  Negative for cough.   Cardiovascular:  Negative for chest pain, palpitations and leg swelling.  Gastrointestinal:  Negative for abdominal distention, blood in stool, diarrhea and nausea.  Genitourinary:  Negative for frequency and hematuria.  Musculoskeletal:  Positive for arthralgias, back pain and gait problem. Negative for joint swelling and neck pain.  Skin:  Negative for rash.  Neurological:  Negative for dizziness, tremors, speech difficulty and weakness.  Hematological:  Does not bruise/bleed easily.  Psychiatric/Behavioral:  Negative for agitation, dysphoric mood, sleep disturbance and suicidal ideas. The patient is not nervous/anxious.     Objective:  BP 120/80 (BP Location: Left Arm, Patient Position: Sitting, Cuff Size: Normal)   Pulse 96   Temp 98.2 F (36.8 C) (Oral)   Ht 5\' 7"  (1.702 m)   Wt 171 lb (77.6 kg)   SpO2 96%   BMI 26.78 kg/m   BP Readings from Last 3 Encounters:  11/01/22 120/80  08/29/22 138/85  07/18/22 124/78    Wt Readings from Last 3 Encounters:  Subjective:  Patient ID: [name], male    DOB: [birth year]  Age: 67 y.o. MRN: [id number]  CC: Follow-up (6 [facility] F/U)   HPI [name] presents for MS, dyslipidemia, weakness Wife Almira Coaster has PE, CHF, a lot of bone fractures, lupus   Outpatient Medications Prior to Visit  Medication Sig Dispense Refill   amitriptyline (ELAVIL) 25 MG tablet TAKE 2 TABLETS AT BEDTIME AS NEEDED FOR SLEEP 180 tablet 3   aspirin EC 81 MG tablet Take 1 tablet (81 mg total) by mouth daily. 100 tablet 3   atorvastatin (LIPITOR) 10 MG tablet Take 1 tablet (10 mg total) by mouth daily. 90 tablet 3   cetirizine (ZYRTEC) 10 MG tablet Take 10 mg by mouth daily as needed for allergies.     cholecalciferol (VITAMIN D) 1000 units tablet Take 1,000 Units by mouth daily.     clotrimazole-betamethasone (LOTRISONE) cream APPLY 1 APPLICATION TOPICALLY TWICE A DAY AS NEEDED FOR IRRITATION 30 g 2   fluticasone (FLONASE) 50 MCG/ACT nasal spray Place 2 sprays into both nostrils daily as needed for allergies or rhinitis.     furosemide (LASIX) 20 MG tablet Take 1 tablet (20 mg total) by mouth daily. 30 tablet 3   HYDROcodone bit-homatropine (HYCODAN) 5-1.5 MG/5ML syrup Take 5 mLs by mouth at bedtime as needed for cough. 120 mL 0   Multiple Vitamin (MULTIVITAMIN) capsule Take 1 tablet by mouth daily.     Probiotic Product (ALIGN) 4 MG CAPS Take 1 capsule (4 mg total) by mouth daily. 90 capsule 3   tadalafil (CIALIS) 5 MG tablet TAKE 1 TABLET DAILY 90 tablet 2   baclofen (LIORESAL) 20 MG tablet TAKE 1 TABLET FOUR TIMES A DAY AS NEEDED FOR MUSCLE SPASMS 360 tablet 3   gabapentin (NEURONTIN) 100 MG capsule TAKE 2 CAPSULES THREE TIMES A DAY AS NEEDED FOR PAIN 360 capsule 1   pantoprazole (PROTONIX) 40 MG tablet Take 1 tablet (40 mg total) by mouth daily. Annual appt due in Sept must see provider for future refills 90 tablet 0   TraMADol HCl 200 MG CP24 Take 1 capsule by mouth daily. 90 capsule 1   No facility-administered  medications prior to visit.    ROS: Review of Systems  Constitutional:  Negative for appetite change, fatigue and unexpected weight change.  HENT:  Negative for congestion, nosebleeds, sneezing, sore throat and trouble swallowing.   Eyes:  Negative for itching and visual disturbance.  Respiratory:  Negative for cough.   Cardiovascular:  Negative for chest pain, palpitations and leg swelling.  Gastrointestinal:  Negative for abdominal distention, blood in stool, diarrhea and nausea.  Genitourinary:  Negative for frequency and hematuria.  Musculoskeletal:  Positive for arthralgias, back pain and gait problem. Negative for joint swelling and neck pain.  Skin:  Negative for rash.  Neurological:  Negative for dizziness, tremors, speech difficulty and weakness.  Hematological:  Does not bruise/bleed easily.  Psychiatric/Behavioral:  Negative for agitation, dysphoric mood, sleep disturbance and suicidal ideas. The patient is not nervous/anxious.     Objective:  BP 120/80 (BP Location: Left Arm, Patient Position: Sitting, Cuff Size: Normal)   Pulse 96   Temp 98.2 F (36.8 C) (Oral)   Ht 5\' 7"  (1.702 m)   Wt 171 lb (77.6 kg)   SpO2 96%   BMI 26.78 kg/m   BP Readings from Last 3 Encounters:  11/01/22 120/80  08/29/22 138/85  07/18/22 124/78    Wt Readings from Last 3 Encounters:  Subjective:  Patient ID: [name], male    DOB: [birth year]  Age: 67 y.o. MRN: [id number]  CC: Follow-up (6 [facility] F/U)   HPI [name] presents for MS, dyslipidemia, weakness Wife Almira Coaster has PE, CHF, a lot of bone fractures, lupus   Outpatient Medications Prior to Visit  Medication Sig Dispense Refill   amitriptyline (ELAVIL) 25 MG tablet TAKE 2 TABLETS AT BEDTIME AS NEEDED FOR SLEEP 180 tablet 3   aspirin EC 81 MG tablet Take 1 tablet (81 mg total) by mouth daily. 100 tablet 3   atorvastatin (LIPITOR) 10 MG tablet Take 1 tablet (10 mg total) by mouth daily. 90 tablet 3   cetirizine (ZYRTEC) 10 MG tablet Take 10 mg by mouth daily as needed for allergies.     cholecalciferol (VITAMIN D) 1000 units tablet Take 1,000 Units by mouth daily.     clotrimazole-betamethasone (LOTRISONE) cream APPLY 1 APPLICATION TOPICALLY TWICE A DAY AS NEEDED FOR IRRITATION 30 g 2   fluticasone (FLONASE) 50 MCG/ACT nasal spray Place 2 sprays into both nostrils daily as needed for allergies or rhinitis.     furosemide (LASIX) 20 MG tablet Take 1 tablet (20 mg total) by mouth daily. 30 tablet 3   HYDROcodone bit-homatropine (HYCODAN) 5-1.5 MG/5ML syrup Take 5 mLs by mouth at bedtime as needed for cough. 120 mL 0   Multiple Vitamin (MULTIVITAMIN) capsule Take 1 tablet by mouth daily.     Probiotic Product (ALIGN) 4 MG CAPS Take 1 capsule (4 mg total) by mouth daily. 90 capsule 3   tadalafil (CIALIS) 5 MG tablet TAKE 1 TABLET DAILY 90 tablet 2   baclofen (LIORESAL) 20 MG tablet TAKE 1 TABLET FOUR TIMES A DAY AS NEEDED FOR MUSCLE SPASMS 360 tablet 3   gabapentin (NEURONTIN) 100 MG capsule TAKE 2 CAPSULES THREE TIMES A DAY AS NEEDED FOR PAIN 360 capsule 1   pantoprazole (PROTONIX) 40 MG tablet Take 1 tablet (40 mg total) by mouth daily. Annual appt due in Sept must see provider for future refills 90 tablet 0   TraMADol HCl 200 MG CP24 Take 1 capsule by mouth daily. 90 capsule 1   No facility-administered  medications prior to visit.    ROS: Review of Systems  Constitutional:  Negative for appetite change, fatigue and unexpected weight change.  HENT:  Negative for congestion, nosebleeds, sneezing, sore throat and trouble swallowing.   Eyes:  Negative for itching and visual disturbance.  Respiratory:  Negative for cough.   Cardiovascular:  Negative for chest pain, palpitations and leg swelling.  Gastrointestinal:  Negative for abdominal distention, blood in stool, diarrhea and nausea.  Genitourinary:  Negative for frequency and hematuria.  Musculoskeletal:  Positive for arthralgias, back pain and gait problem. Negative for joint swelling and neck pain.  Skin:  Negative for rash.  Neurological:  Negative for dizziness, tremors, speech difficulty and weakness.  Hematological:  Does not bruise/bleed easily.  Psychiatric/Behavioral:  Negative for agitation, dysphoric mood, sleep disturbance and suicidal ideas. The patient is not nervous/anxious.     Objective:  BP 120/80 (BP Location: Left Arm, Patient Position: Sitting, Cuff Size: Normal)   Pulse 96   Temp 98.2 F (36.8 C) (Oral)   Ht 5\' 7"  (1.702 m)   Wt 171 lb (77.6 kg)   SpO2 96%   BMI 26.78 kg/m   BP Readings from Last 3 Encounters:  11/01/22 120/80  08/29/22 138/85  07/18/22 124/78    Wt Readings from Last 3 Encounters:

## 2022-11-01 NOTE — Assessment & Plan Note (Signed)
Resolved off Ocrevus

## 2022-11-01 NOTE — Assessment & Plan Note (Signed)
Toradol 10 mg po prn severe HAs

## 2022-11-01 NOTE — Assessment & Plan Note (Signed)
In a power w/c today

## 2022-11-01 NOTE — Assessment & Plan Note (Addendum)
Check testosterone If low again - start on gel

## 2022-11-01 NOTE — Assessment & Plan Note (Signed)
In a power w/c

## 2022-11-03 ENCOUNTER — Encounter: Payer: Self-pay | Admitting: Gastroenterology

## 2022-11-03 MED ORDER — TESTOSTERONE 50 MG/5GM (1%) TD GEL: 10.0000 g | Freq: Every day | TRANSDERMAL | 5 refills | Status: DC

## 2022-11-03 NOTE — Addendum Note (Signed)
Addended by: Tresa Garter on: 11/03/2022 09:49 AM   Modules accepted: Orders

## 2022-11-16 ENCOUNTER — Ambulatory Visit (AMBULATORY_SURGERY_CENTER): Payer: Medicare Other | Admitting: *Deleted

## 2022-11-16 ENCOUNTER — Telehealth: Payer: Self-pay | Admitting: *Deleted

## 2022-11-16 VITALS — Ht 67.0 in | Wt 171.0 lb

## 2022-11-16 DIAGNOSIS — Z85038 Personal history of other malignant neoplasm of large intestine: Secondary | ICD-10-CM

## 2022-11-16 MED ORDER — PEG 3350-KCL-NA BICARB-NACL 420 G PO SOLR
4000.0000 mL | Freq: Once | ORAL | 0 refills | Status: AC
Start: 2022-11-16 — End: 2022-11-16

## 2022-11-16 NOTE — Telephone Encounter (Signed)
Pt'Attempt to reach pt for pre-visit. LM with call back # Will attempt to reach again in 5 min due to no other # listed in profile

## 2022-11-16 NOTE — Progress Notes (Signed)
Pt's name and DOB verified at the beginning of the pre-visit wit 2 identifiers  Pt has progressive MS uses  cane but can't walk long distances  Gave both [facility] main # and MD on call # prior to instructions.   No egg or soy allergy known to patient   No issues known to pt with past sedation with any surgeries or procedures  Patient denies ever being intubated  Pt has no issues moving head neck or swallowing  No FH of Malignant Hyperthermia  Pt is not on diet pills or shots  Pt is not on home 02   Pt is not on blood thinners   Pt has frequent issues with constipation RN instructed pt to use Miralax per bottles instructions a week before prep days. Pt states they will  Pt is not on dialysis  Pt denise any abnormal heart rhythms   Pt denies any upcoming cardiac testing  Pt encouraged to use to use Singlecare or Goodrx to reduce cost   Patient's chart reviewed by Cathlyn Parsons CNRA prior to pre-visit and patient appropriate for the [facility].  Pre-visit completed and red dot placed by patient's name on their procedure day (on provider's schedule).  .  Visit by phone  Pt states weight is 171lb  Instructed pt why it is important to and  to call if they have any changes in health or new medications. Directed them to the # given and on instructions.     Instructions reviewed with pt and pt states understanding. Instructed to review again prior to procedure. Pt states they will.   Instructions sent by mail with coupon and by my chart

## 2022-11-22 ENCOUNTER — Other Ambulatory Visit (HOSPITAL_COMMUNITY): Payer: Self-pay

## 2022-12-01 ENCOUNTER — Ambulatory Visit (AMBULATORY_SURGERY_CENTER): Payer: Medicare Other | Admitting: Gastroenterology

## 2022-12-01 ENCOUNTER — Encounter: Payer: Self-pay | Admitting: Gastroenterology

## 2022-12-01 VITALS — BP 110/65 | HR 79 | Temp 98.0°F | Resp 12 | Ht 67.0 in | Wt 171.0 lb

## 2022-12-01 DIAGNOSIS — I10 Essential (primary) hypertension: Secondary | ICD-10-CM | POA: Diagnosis not present

## 2022-12-01 DIAGNOSIS — Z860101 Personal history of adenomatous and serrated colon polyps: Secondary | ICD-10-CM

## 2022-12-01 DIAGNOSIS — Z09 Encounter for follow-up examination after completed treatment for conditions other than malignant neoplasm: Secondary | ICD-10-CM | POA: Diagnosis not present

## 2022-12-01 DIAGNOSIS — D122 Benign neoplasm of ascending colon: Secondary | ICD-10-CM

## 2022-12-01 DIAGNOSIS — G35 Multiple sclerosis: Secondary | ICD-10-CM | POA: Diagnosis not present

## 2022-12-01 DIAGNOSIS — Z860100 Personal history of colon polyps, unspecified: Secondary | ICD-10-CM

## 2022-12-01 MED ORDER — SODIUM CHLORIDE 0.9 % IV SOLN: 500.0000 mL | Freq: Once | INTRAVENOUS | Status: DC

## 2022-12-01 NOTE — Progress Notes (Signed)
Pt's states no medical or surgical changes since previsit or office visit. 

## 2022-12-01 NOTE — Progress Notes (Signed)
Called to room to assist during endoscopic procedure.  Patient ID and intended procedure confirmed with present staff. Received instructions for my participation in the procedure from the performing physician.  

## 2022-12-01 NOTE — Progress Notes (Signed)
Uneventful anesthetic. Report to pacu rn. Vss. Care resumed by rn. 

## 2022-12-01 NOTE — Op Note (Signed)
[facility] Patient Name: [name] Procedure Date: 12/01/2022 10:58 AM MRN: [id number] Endoscopist: Sherilyn Cooter L. Myrtie Neither , MD, [id number] Age: 67 Referring MD:  Date of Birth: Aug 11, 1955 Gender: Male Account #: [id number] Procedure:                Colonoscopy Indications:              Surveillance: Personal history of adenomatous                            polyps on last colonoscopy > 3 years ago                           Subcentimeter cecal tubular adenoma July 2021 -was                            an inpatient exam for workup of C difficile                            diarrhea. Medicines:                Monitored Anesthesia Care Procedure:                Pre-Anesthesia Assessment:                           - Prior to the procedure, a History and Physical                            was performed, and patient medications and                            allergies were reviewed. The patient's tolerance of                            previous anesthesia was also reviewed. The risks                            and benefits of the procedure and the sedation                            options and risks were discussed with the patient.                            All questions were answered, and informed consent                            was obtained. Prior Anticoagulants: The patient has                            taken no anticoagulant or antiplatelet agents. ASA                            Grade Assessment: II - A patient with mild systemic  disease. After reviewing the risks and benefits,                            the patient was deemed in satisfactory condition to                            undergo the procedure.                           After obtaining informed consent, the colonoscope                            was passed under direct vision. Throughout the                            procedure, the patient's blood pressure, pulse, and                             oxygen saturations were monitored continuously. The                            CF [id number] #[id number] was introduced through the anus                            and advanced to the the cecum, identified by                            appendiceal orifice and ileocecal valve. The                            colonoscopy was performed without difficulty. The                            patient tolerated the procedure well. The quality                            of the bowel preparation was good after lavage of                            opaque liquid and discarded degree of (see photos).                            The ileocecal valve, appendiceal orifice, and                            rectum were photographed. Scope In: 11:12:13 AM Scope Out: 11:27:46 AM Scope Withdrawal Time: 0 hours 12 minutes 18 seconds  Total Procedure Duration: 0 hours 15 minutes 33 seconds  Findings:                 The perianal and digital rectal examinations were                            normal.  Repeat examination of right colon under NBI                            performed.                           A diminutive polyp was found in the ascending                            colon. The polyp was semi-sessile. The polyp was                            removed with a cold snare. Resection and retrieval                            were complete.                           Diverticula were found in the left colon.                           Retroflexion in the rectum was not performed due to                            anatomy.                           The exam was otherwise without abnormality. Complications:            No immediate complications. Estimated Blood Loss:     Estimated blood loss was minimal. Impression:               - One diminutive polyp in the ascending colon,                            removed with a cold snare. Resected and retrieved.                           -  Diverticulosis in the left colon.                           - The examination was otherwise normal. Recommendation:           - Patient has a contact number available for                            emergencies. The signs and symptoms of potential                            delayed complications were discussed with the                            patient. Return to normal activities tomorrow.                            Written discharge instructions were provided to the  patient.                           - Resume previous diet.                           - Continue present medications.                           - Await pathology results.                           - Repeat colonoscopy is recommended for                            surveillance. The colonoscopy date will be                            determined after pathology results from today's                            exam become available for review. (5 years for                            polyp adenomatous or serrated) next exam, consume                            more water with bowel preparation (patient reports                            constipation related to MS) Sherilyn Cooter L. Myrtie Neither, MD 12/01/2022 11:35:05 AM This report has been signed electronically.

## 2022-12-01 NOTE — Progress Notes (Signed)
History and Physical:  This patient presents for endoscopic testing for: Encounter Diagnosis  Name Primary?   History of colonic polyps Yes    7mm cecal TA removed during inpatient colonoscopy for C difficile July 2021 Former Dr Christella Hartigan patient.  Chronic constipation from MS   Patient is otherwise without complaints or active issues today.   Past Medical History: Past Medical History:  Diagnosis Date   Arthritis    Asthma    C. difficile diarrhea    Gallstone pancreatitis    Hernia    History of inguinal hernia repair    Hypertension    Low back pain    MS (multiple sclerosis) (HCC)    Pancreatitis hosp[italized with gallstone pancreatitis 3/12[   Vitamin D deficiency      Past Surgical History: Past Surgical History:  Procedure Laterality Date   BIOPSY  09/02/2019   Procedure: BIOPSY;  Surgeon: Meryl Dare, MD;  Location: [facility];  Service: Endoscopy;;   CHOLECYSTECTOMY  04/2010   COLONOSCOPY WITH PROPOFOL N/A 09/02/2019   Procedure: COLONOSCOPY WITH PROPOFOL;  Surgeon: Meryl Dare, MD;  Location: [facility];  Service: Endoscopy;  Laterality: N/A;   CYSTECTOMY     on back and R hand   CYSTECTOMY     left arm   HERNIA REPAIR     x 2   INGUINAL HERNIA REPAIR  03/17/2011   Procedure: HERNIA REPAIR INGUINAL ADULT;  Surgeon: [name], MD;  Location: [facility];  Service: General;  Laterality: Left;  Open left inguinal hernia repair with mesh   KNEE ARTHROSCOPY     right   NOSE SURGERY     POLYPECTOMY  09/02/2019   Procedure: POLYPECTOMY;  Surgeon: Meryl Dare, MD;  Location: [facility];  Service: Endoscopy;;   TONSILLECTOMY     WISDOM TOOTH EXTRACTION      Allergies: Allergies  Allergen Reactions   Meperidine Other (See Comments)    Reaction:  Unknown    Ocrevus [Ocrelizumab]     C diff relapsed x4   Penicillin G Other (See Comments)   Prednisone Palpitations    Only IV    Outpatient Meds: Current Outpatient  Medications  Medication Sig Dispense Refill   amitriptyline (ELAVIL) 25 MG tablet TAKE 2 TABLETS AT BEDTIME AS NEEDED FOR SLEEP 180 tablet 3   aspirin EC 81 MG tablet Take 1 tablet (81 mg total) by mouth daily. 100 tablet 3   baclofen (LIORESAL) 20 MG tablet TAKE 1 TABLET FOUR TIMES A DAY AS NEEDED FOR MUSCLE SPASMS 360 tablet 3   cholecalciferol (VITAMIN D) 1000 units tablet Take 1,000 Units by mouth daily.     clotrimazole-betamethasone (LOTRISONE) cream APPLY 1 APPLICATION TOPICALLY TWICE A DAY AS NEEDED FOR IRRITATION 30 g 2   gabapentin (NEURONTIN) 100 MG capsule Take 1 capsule (100 mg total) by mouth 3 (three) times daily. 360 capsule 1   HYDROcodone bit-homatropine (HYCODAN) 5-1.5 MG/5ML syrup Take 5 mLs by mouth at bedtime as needed for cough. 120 mL 0   ketorolac (TORADOL) 10 MG tablet Take 1 tablet (10 mg total) by mouth every 8 (eight) hours as needed for severe pain. 20 tablet 1   Multiple Vitamin (MULTIVITAMIN) capsule Take 1 tablet by mouth daily.     pantoprazole (PROTONIX) 40 MG tablet Take 1 tablet (40 mg total) by mouth daily. Annual appt due in Sept must see provider for future refills 90 tablet 3   Probiotic Product (ALIGN) 4  MG CAPS Take 1 capsule (4 mg total) by mouth daily. 90 capsule 3   tadalafil (CIALIS) 5 MG tablet TAKE 1 TABLET DAILY 90 tablet 2   TraMADol HCl 200 MG CP24 Take 1 capsule by mouth daily. 90 capsule 1   atorvastatin (LIPITOR) 10 MG tablet Take 1 tablet (10 mg total) by mouth daily. 90 tablet 3   cetirizine (ZYRTEC) 10 MG tablet Take 10 mg by mouth daily as needed for allergies.     fluticasone (FLONASE) 50 MCG/ACT nasal spray Place 2 sprays into both nostrils daily as needed for allergies or rhinitis.     furosemide (LASIX) 20 MG tablet Take 1 tablet (20 mg total) by mouth daily. 30 tablet 3   testosterone (ANDROGEL) 50 MG/5GM (1%) GEL Place 10 g onto the skin daily. (Patient not taking: Reported on 11/16/2022) 300 g 5   Current Facility-Administered  Medications  Medication Dose Route Frequency Provider Last Rate Last Admin   0.9 %  sodium chloride infusion  500 mL Intravenous Once Charlie Pitter III, MD          ___________________________________________________________________ Objective   Exam:  BP (!) 152/99   Pulse 79   Temp 98 F (36.7 C) (Temporal)   Ht 5\' 7"  (1.702 m)   Wt 171 lb (77.6 kg)   SpO2 97%   BMI 26.78 kg/m   CV: regular , S1/S2 Resp: clear to auscultation bilaterally, normal RR and effort noted GI: soft, no tenderness, with active bowel sounds.upper rectus diastasis   Assessment: Encounter Diagnosis  Name Primary?   History of colonic polyps Yes     Plan: Colonoscopy   The benefits and risks of the planned procedure were described in detail with the patient or (when appropriate) their health care proxy.  Risks were outlined as including, but not limited to, bleeding, infection, perforation, adverse medication reaction leading to cardiac or pulmonary decompensation, pancreatitis (if ERCP).  The limitation of incomplete mucosal visualization was also discussed.  No guarantees or warranties were given.  The patient is appropriate for an endoscopic procedure in the ambulatory setting.   - Amada Jupiter, MD

## 2022-12-01 NOTE — Patient Instructions (Addendum)
-   Resume previous diet. - Continue present medications. - Await pathology results. - Repeat colonoscopy is recommended for surveillance. The colonoscopy date will be determined after pathology results from today's exam become available for review. (5 years for polyp adenomatous or serrated) next exam, consume more water with bowel preparation (patient reports constipation related to MS)  YOU HAD AN ENDOSCOPIC PROCEDURE TODAY AT THE [facility]:   Refer to the procedure report that was given to you for any specific questions about what was found during the examination.  If the procedure report does not answer your questions, please call your gastroenterologist to clarify.  If you requested that your care partner not be given the details of your procedure findings, then the procedure report has been included in a sealed envelope for you to review at your convenience later.  YOU SHOULD EXPECT: Some feelings of bloating in the abdomen. Passage of more gas than usual.  Walking can help get rid of the air that was put into your GI tract during the procedure and reduce the bloating. If you had a lower endoscopy (such as a colonoscopy or flexible sigmoidoscopy) you may notice spotting of blood in your stool or on the toilet paper. If you underwent a bowel prep for your procedure, you may not have a normal bowel movement for a few days.  Please Note:  You might notice some irritation and congestion in your nose or some drainage.  This is from the oxygen used during your procedure.  There is no need for concern and it should clear up in a day or so.  SYMPTOMS TO REPORT IMMEDIATELY:  Following lower endoscopy (colonoscopy or flexible sigmoidoscopy):  Excessive amounts of blood in the stool  Significant tenderness or worsening of abdominal pains  Swelling of the abdomen that is new, acute  Fever of 100F or higher  For urgent or emergent issues, a gastroenterologist can be reached at any hour by  calling [phone]. Do not use MyChart messaging for urgent concerns.    DIET:  We do recommend a small meal at first, but then you may proceed to your regular diet.  Drink plenty of fluids but you should avoid alcoholic beverages for 24 hours.  ACTIVITY:  You should plan to take it easy for the rest of today and you should NOT DRIVE or use heavy machinery until tomorrow (because of the sedation medicines used during the test).    FOLLOW UP: Our staff will call the number listed on your records the next business day following your procedure.  We will call around 7:15- 8:00 am to check on you and address any questions or concerns that you may have regarding the information given to you following your procedure. If we do not reach you, we will leave a message.     If any biopsies were taken you will be contacted by phone or by letter within the next 1-3 weeks.  Please call us at [phone] if you have not heard about the biopsies in 3 weeks.    SIGNATURES/CONFIDENTIALITY: You and/or your care partner have signed paperwork which will be entered into your electronic medical record.  These signatures attest to the fact that that the information above on your After Visit Summary has been reviewed and is understood.  Full responsibility of the confidentiality of this discharge information lies with you and/or your care-partner.

## 2022-12-04 ENCOUNTER — Telehealth: Payer: Self-pay

## 2022-12-04 NOTE — Telephone Encounter (Signed)
  Follow up Call-     12/01/2022   10:31 AM  Call back number  Post procedure Call Back phone  # [phone]  Permission to leave phone message Yes     Patient questions:  Do you have a fever, pain , or abdominal swelling? No. Pain Score  0 *  Have you tolerated food without any problems? Yes.    Have you been able to return to your normal activities? Yes.    Do you have any questions about your discharge instructions: Diet   No. Medications  No. Follow up visit  No.  Do you have questions or concerns about your Care? No.  Actions: * If pain score is 4 or above: No action needed, pain <4.

## 2022-12-05 ENCOUNTER — Telehealth: Payer: Self-pay | Admitting: Internal Medicine

## 2022-12-05 NOTE — Telephone Encounter (Signed)
Prescription Request  12/05/2022  LOV: 11/01/2022  What is the name of the medication or equipment? Tadafil 5  Have you contacted your pharmacy to request a refill? Yes   Which pharmacy would you like this sent to?  [facility] - [place], [place] - [address] [place] [address] Phone: [phone] Fax: [phone]  [facility] [address] [place], [place] - [address] [place] [address] Phone: [phone] Fax: [phone]  [id number] - [place], [place] - [address] [place] [address] Phone: [phone] Fax: [phone]  [id number] [place], [place] - [address] AT [facility] [address] [facility] [address] [facility] [address] [place] [address] Phone: [phone] Fax: [phone]    Patient notified that their request is being sent to the clinical staff for review and that they should receive a response within 2 business days.   Please advise at Mobile [phone] (mobile)

## 2022-12-05 NOTE — Telephone Encounter (Signed)
Please send to the [facility] - I forgot to erase the other pharmacies

## 2022-12-06 ENCOUNTER — Encounter: Payer: Self-pay | Admitting: Internal Medicine

## 2022-12-06 LAB — SURGICAL PATHOLOGY

## 2022-12-07 ENCOUNTER — Encounter: Payer: Self-pay | Admitting: Gastroenterology

## 2022-12-07 MED ORDER — TADALAFIL 5 MG PO TABS: 5.0000 mg | ORAL_TABLET | Freq: Every day | ORAL | 3 refills | Status: DC

## 2022-12-07 NOTE — Telephone Encounter (Signed)
Okay.  That is done.  Thanks

## 2023-02-25 NOTE — Progress Notes (Unsigned)
Cardiology Office Note:    Date:  03/01/2023   ID:  [name], DOB 11/11/1955, MRN [id number]  PCP:  Tresa Garter, MD  Cardiologist:  None  Electrophysiologist:  None   Referring MD: Tresa Garter, MD   Chief Complaint  Patient presents with   Coronary Artery Disease    History of Present Illness:    [name] is a 68 y.o. male with a hx of asthma, gallstone pancreatitis, MS who presents for follow-up.  He was referred by Dr. Posey Rea for evaluation of CAD, initially seen 08/29/2022.  Denies any chest pain, dyspnea, lightheadedness, syncope,or palpitations.  Does reports chronic swelling in left leg.  No smoking history.  Family history includes mother had CVA in 50s.  Calcium score on 05/2022 was 57 (41st percentile), also noted to have dilated ascending aorta measuring 4 cm.  Since last clinic visit, he reports he is doing well.  Denies any chest pain, dyspnea, lightheadedness, syncope, or palpitations.  Reports chronic left lower extremity swelling.  Reports BP has been 120s over 70s when checks at home.   BP Readings from Last 3 Encounters:  03/01/23 (!) 149/84  12/01/22 110/65  11/01/22 120/80      Past Medical History:  Diagnosis Date   Arthritis    Asthma    C. difficile diarrhea    Gallstone pancreatitis    Hernia    History of inguinal hernia repair    Hypertension    Low back pain    MS (multiple sclerosis) (HCC)    Pancreatitis hosp[italized with gallstone pancreatitis 3/12[   Vitamin D deficiency     Past Surgical History:  Procedure Laterality Date   BIOPSY  09/02/2019   Procedure: BIOPSY;  Surgeon: Meryl Dare, MD;  Location: [facility];  Service: Endoscopy;;   CHOLECYSTECTOMY  04/2010   COLONOSCOPY WITH PROPOFOL N/A 09/02/2019   Procedure: COLONOSCOPY WITH PROPOFOL;  Surgeon: Meryl Dare, MD;  Location: [facility];  Service: Endoscopy;  Laterality: N/A;   CYSTECTOMY     on back and R hand   CYSTECTOMY     left  arm   HERNIA REPAIR     x 2   INGUINAL HERNIA REPAIR  03/17/2011   Procedure: HERNIA REPAIR INGUINAL ADULT;  Surgeon: [name], MD;  Location: [facility];  Service: General;  Laterality: Left;  Open left inguinal hernia repair with mesh   KNEE ARTHROSCOPY     right   NOSE SURGERY     POLYPECTOMY  09/02/2019   Procedure: POLYPECTOMY;  Surgeon: Meryl Dare, MD;  Location: [facility];  Service: Endoscopy;;   TONSILLECTOMY     WISDOM TOOTH EXTRACTION      Current Medications: Current Meds  Medication Sig   amitriptyline (ELAVIL) 25 MG tablet TAKE 2 TABLETS AT BEDTIME AS NEEDED FOR SLEEP   aspirin EC 81 MG tablet Take 1 tablet (81 mg total) by mouth daily.   baclofen (LIORESAL) 20 MG tablet TAKE 1 TABLET FOUR TIMES A DAY AS NEEDED FOR MUSCLE SPASMS   cetirizine (ZYRTEC) 10 MG tablet Take 10 mg by mouth daily as needed for allergies.   cholecalciferol (VITAMIN D) 1000 units tablet Take 1,000 Units by mouth daily.   clotrimazole-betamethasone (LOTRISONE) cream APPLY 1 APPLICATION TOPICALLY TWICE A DAY AS NEEDED FOR IRRITATION   fluticasone (FLONASE) 50 MCG/ACT nasal spray Place 2 sprays into both nostrils daily as needed for allergies or rhinitis.   furosemide (LASIX) 20 MG  tablet Take 1 tablet (20 mg total) by mouth daily.   gabapentin (NEURONTIN) 100 MG capsule Take 1 capsule (100 mg total) by mouth 3 (three) times daily.   HYDROcodone bit-homatropine (HYCODAN) 5-1.5 MG/5ML syrup Take 5 mLs by mouth at bedtime as needed for cough.   ketorolac (TORADOL) 10 MG tablet Take 1 tablet (10 mg total) by mouth every 8 (eight) hours as needed for severe pain.   Multiple Vitamin (MULTIVITAMIN) capsule Take 1 tablet by mouth daily.   pantoprazole (PROTONIX) 40 MG tablet Take 1 tablet (40 mg total) by mouth daily. Annual appt due in Sept must see provider for future refills   Probiotic Product (ALIGN) 4 MG CAPS Take 1 capsule (4 mg total) by mouth daily.   tadalafil (CIALIS) 5  MG tablet Take 1 tablet (5 mg total) by mouth daily.   testosterone (ANDROGEL) 50 MG/5GM (1%) GEL Place 10 g onto the skin daily.   TraMADol HCl 200 MG CP24 Take 1 capsule by mouth daily.     Allergies:   Meperidine, Ocrevus [ocrelizumab], Penicillin g, and Prednisone   Social History   Socioeconomic History   Marital status: Married    Spouse name: Rene Kocher   Number of children: 2   Years of education: Not on file   Highest education level: Associate degree: occupational, Scientist, product/process development, or vocational program  Occupational History   Occupation: Water quality scientist: [employer]  Tobacco Use   Smoking status: Never   Smokeless tobacco: Never  Vaping Use   Vaping status: Never Used  Substance and Sexual Activity   Alcohol use: No   Drug use: No   Sexual activity: Yes  Other Topics Concern   Not on file  Social History Narrative   Not on file   Social Drivers of Health   Financial Resource Strain: Low Risk  (08/29/2022)   Overall Financial Resource Strain (CARDIA)    Difficulty of Paying Living Expenses: Not hard at all  Food Insecurity: No Food Insecurity (08/29/2022)   Hunger Vital Sign    Worried About Running Out of Food in the Last Year: Never true    Ran Out of Food in the Last Year: Never true  Transportation Needs: No Transportation Needs (08/29/2022)   PRAPARE - Administrator, Civil Service (Medical): No    Lack of Transportation (Non-Medical): No  Physical Activity: Unknown (08/29/2022)   Exercise Vital Sign    Days of Exercise per Week: Patient declined    Minutes of Exercise per Session: 30 min  Stress: No Stress Concern Present (08/29/2022)   [employer] of Occupational Health - Occupational Stress Questionnaire    Feeling of Stress : Not at all  Social Connections: Socially Isolated (08/29/2022)   Social Connection and Isolation Panel [NHANES]    Frequency of Communication with Friends and Family: Once a week    Frequency of Social Gatherings  with Friends and Family: Once a week    Attends Religious Services: Never    Database administrator or Organizations: No    Attends Engineer, structural: Never    Marital Status: Married     Family History: The patient's family history includes Diabetes in his mother; Lung cancer in his father. There is no history of Colon cancer, Esophageal cancer, Pancreatic cancer, Stomach cancer, Liver disease, Colon polyps, or Rectal cancer.  ROS:   Please see the history of present illness.     All other systems reviewed and are  negative.  EKGs/Labs/Other Studies Reviewed:    The following studies were reviewed today:   EKG:   08/29/2022: Normal sinus rhythm, rate 82, PACs, LVH 03/01/2023: Sinus rhythm with PACs, rate 77  Recent Labs: 11/01/2022: ALT 28; BUN 15; Creatinine, Ser 0.82; Hemoglobin 15.0; Platelets 158.0; Potassium 4.1; Sodium 139; TSH 2.44  Recent Lipid Panel    Component Value Date/Time   CHOL 121 11/01/2022 1459   TRIG 65.0 11/01/2022 1459   HDL 53.40 11/01/2022 1459   CHOLHDL 2 11/01/2022 1459   VLDL 13.0 11/01/2022 1459   LDLCALC 55 11/01/2022 1459   LDLDIRECT 118.4 06/09/2009 1516    Physical Exam:    VS:  BP (!) 149/84 (BP Location: Left Arm, Patient Position: Sitting, Cuff Size: Normal)   Pulse 77   Ht 5\' 7"  (1.702 m)   Wt 175 lb 3.2 oz (79.5 kg)   SpO2 97%   BMI 27.44 kg/m     Wt Readings from Last 3 Encounters:  03/01/23 175 lb 3.2 oz (79.5 kg)  12/01/22 171 lb (77.6 kg)  11/16/22 171 lb (77.6 kg)     GEN:  Well nourished, well developed in no acute distress HEENT: Normal NECK: No JVD; No carotid bruits CARDIAC: RRR, no murmurs, rubs, gallops RESPIRATORY:  Clear to auscultation without rales, wheezing or rhonchi  ABDOMEN: Soft, non-tender, non-distended MUSCULOSKELETAL: 2+ left lower extremity edema SKIN: Warm and dry NEUROLOGIC:  Alert and oriented x 3 PSYCHIATRIC:  Normal affect   ASSESSMENT:    1. Coronary artery disease involving  native coronary artery of native heart without angina pectoris   2. Ascending aorta dilatation (HCC)   3. Premature atrial contraction   4. Elevated BP without diagnosis of hypertension     PLAN:    CAD: Calcium score on 05/2022 was 57 (41st percentile) -LDL 77 on 04/04/2022.  Started atorvastatin 10 mg daily.  LDL 55 on 11/01/2022  Left lower extremity edema: Lower extremity duplex negative for DVT 08/2022  Dilated ascending aorta: Measured 4 cm on calcium score 05/2022.  Recommend echocardiogram in 1 year to follow  Frequent PACs: Check Zio patch x 3 days to quantify burden  Elevated BP: No history of hypertension, BP elevated in clinic today but reports has been well-controlled (120s over 70s) when checks at home.  Will monitor.  RTC in 6 months   Medication Adjustments/Labs and Tests Ordered: Current medicines are reviewed at length with the patient today.  Concerns regarding medicines are outlined above.  Orders Placed This Encounter  Procedures   LONG TERM MONITOR (3-14 DAYS)   EKG 12-Lead   ECHOCARDIOGRAM COMPLETE   No orders of the defined types were placed in this encounter.   Patient Instructions  Medication Instructions:  Continue current medications *If you need a refill on your cardiac medications before your next appointment, please call your pharmacy*   Lab Work: none If you have labs (blood work) drawn today and your tests are completely normal, you will receive your results only by: MyChart Message (if you have MyChart) OR A paper copy in the mail If you have any lab test that is abnormal or we need to change your treatment, we will call you to review the results.   Testing/Procedures: Echo please schedule to be done in 3 months  Your physician has requested that you have an echocardiogram. Echocardiography is a painless test that uses sound waves to create images of your heart. It provides your doctor with information about the size and  shape of your  heart and how well your heart's chambers and valves are working. This procedure takes approximately one hour. There are no restrictions for this procedure. Please do NOT wear cologne, perfume, aftershave, or lotions (deodorant is allowed). Please arrive 15 minutes prior to your appointment time.  Please note: We ask at that you not bring children with you during ultrasound (echo/ vascular) testing. Due to room size and safety concerns, children are not allowed in the ultrasound rooms during exams. Our front office staff cannot provide observation of children in our lobby area while testing is being conducted. An adult accompanying a patient to their appointment will only be allowed in the ultrasound room at the discretion of the ultrasound technician under special circumstances. We apologize for any inconvenience.    AND  Zio  ZIO XT- Long Term Monitor Instructions  Your physician has requested you wear a ZIO patch monitor for 3 days.  This is a single patch monitor. [employer] supplies one patch monitor per enrollment. Additional stickers are not available. Please do not apply patch if you will be having a Nuclear Stress Test,  Echocardiogram, Cardiac CT, MRI, or Chest Xray during the period you would be wearing the  monitor. The patch cannot be worn during these tests. You cannot remove and re-apply the  ZIO XT patch monitor.  Your ZIO patch monitor will be mailed 3 day USPS to your address on file. It may take 3-5 days  to receive your monitor after you have been enrolled.  Once you have received your monitor, please review the enclosed instructions. Your monitor  has already been registered assigning a specific monitor serial # to you.  Billing and Patient Assistance Program Information  We have supplied [employer] with any of your insurance information on file for billing purposes. [employer] offers a sliding scale Patient Assistance Program for patients that do not have  insurance, or whose  insurance does not completely cover the cost of the ZIO monitor.  You must apply for the Patient Assistance Program to qualify for this discounted rate.  To apply, please call [employer] at [phone], select option 4, select option 2, ask to apply for  Patient Assistance Program. Meredeth Ide will ask your household income, and how many people  are in your household. They will quote your out-of-pocket cost based on that information.  [employer] will also be able to set up a 28-month, interest-free payment plan if needed.  Applying the monitor   Shave hair from upper left chest.  Hold abrader disc by orange tab. Rub abrader in 40 strokes over the upper left chest as  indicated in your monitor instructions.  Clean area with 4 enclosed alcohol pads. Let dry.  Apply patch as indicated in monitor instructions. Patch will be placed under collarbone on left  side of chest with arrow pointing upward.  Rub patch adhesive wings for 2 minutes. Remove white label marked "1". Remove the white  label marked "2". Rub patch adhesive wings for 2 additional minutes.  While looking in a mirror, press and release button in center of patch. A small green light will  flash 3-4 times. This will be your only indicator that the monitor has been turned on.  Do not shower for the first 24 hours. You may shower after the first 24 hours.  Press the button if you feel a symptom. You will hear a small click. Record Date, Time and  Symptom in the Patient Logbook.  When you are ready  to remove the patch, follow instructions on the last 2 pages of Patient  Logbook. Stick patch monitor onto the last page of Patient Logbook.  Place Patient Logbook in the blue and white box. Use locking tab on box and tape box closed  securely. The blue and white box has prepaid postage on it. Please place it in the mailbox as  soon as possible. Your physician should have your test results approximately 7 days after the  monitor has been mailed back  to [facility].  Call [facility] Customer Care at [phone] if you have questions regarding  your ZIO XT patch monitor. Call them immediately if you see an orange light blinking on your  monitor.  If your monitor falls off in less than 4 days, contact our Monitor department at [phone].  If your monitor becomes loose or falls off after 4 days call [employer] at [phone] for  suggestions on securing your monitor    Follow-Up: At [facility], you and your health needs are our priority.  As part of our continuing mission to provide you with exceptional heart care, we have created designated Provider Care Teams.  These Care Teams include your primary Cardiologist (physician) and Advanced Practice Providers (APPs -  Physician Assistants and Nurse Practitioners) who all work together to provide you with the care you need, when you need it.  We recommend signing up for the patient portal called "MyChart".  Sign up information is provided on this After Visit Summary.  MyChart is used to connect with patients for Virtual Visits (Telemedicine).  Patients are able to view lab/test results, encounter notes, upcoming appointments, etc.  Non-urgent messages can be sent to your provider as well.   To learn more about what you can do with MyChart, go to [email or web address].    Your next appointment:   6 month(s)  Provider:   Dr. Bjorn Pippin   Other Instructions none         Signed, Little Ishikawa, MD  03/01/2023 5:25 PM    [facility]

## 2023-03-01 ENCOUNTER — Encounter: Payer: Self-pay | Admitting: Cardiology

## 2023-03-01 ENCOUNTER — Ambulatory Visit: Payer: Medicare Other | Attending: Cardiology | Admitting: Cardiology

## 2023-03-01 ENCOUNTER — Ambulatory Visit: Payer: Medicare Other | Attending: Cardiology

## 2023-03-01 VITALS — BP 149/84 | HR 77 | Ht 67.0 in | Wt 175.2 lb

## 2023-03-01 DIAGNOSIS — I77810 Thoracic aortic ectasia: Secondary | ICD-10-CM | POA: Diagnosis not present

## 2023-03-01 DIAGNOSIS — I491 Atrial premature depolarization: Secondary | ICD-10-CM

## 2023-03-01 DIAGNOSIS — I2510 Atherosclerotic heart disease of native coronary artery without angina pectoris: Secondary | ICD-10-CM | POA: Diagnosis not present

## 2023-03-01 DIAGNOSIS — R030 Elevated blood-pressure reading, without diagnosis of hypertension: Secondary | ICD-10-CM | POA: Insufficient documentation

## 2023-03-01 NOTE — Patient Instructions (Addendum)
Medication Instructions:  Continue current medications *If you need a refill on your cardiac medications before your next appointment, please call your pharmacy*   Lab Work: none If you have labs (blood work) drawn today and your tests are completely normal, you will receive your results only by: MyChart Message (if you have MyChart) OR A paper copy in the mail If you have any lab test that is abnormal or we need to change your treatment, we will call you to review the results.   Testing/Procedures: Echo please schedule to be done in 3 months  Your physician has requested that you have an echocardiogram. Echocardiography is a painless test that uses sound waves to create images of your heart. It provides your doctor with information about the size and shape of your heart and how well your heart's chambers and valves are working. This procedure takes approximately one hour. There are no restrictions for this procedure. Please do NOT wear cologne, perfume, aftershave, or lotions (deodorant is allowed). Please arrive 15 minutes prior to your appointment time.  Please note: We ask at that you not bring children with you during ultrasound (echo/ vascular) testing. Due to room size and safety concerns, children are not allowed in the ultrasound rooms during exams. Our front office staff cannot provide observation of children in our lobby area while testing is being conducted. An adult accompanying a patient to their appointment will only be allowed in the ultrasound room at the discretion of the ultrasound technician under special circumstances. We apologize for any inconvenience.    AND  Zio  ZIO XT- Long Term Monitor Instructions  Your physician has requested you wear a ZIO patch monitor for 3 days.  This is a single patch monitor. [employer] supplies one patch monitor per enrollment. Additional stickers are not available. Please do not apply patch if you will be having a Nuclear Stress Test,   Echocardiogram, Cardiac CT, MRI, or Chest Xray during the period you would be wearing the  monitor. The patch cannot be worn during these tests. You cannot remove and re-apply the  ZIO XT patch monitor.  Your ZIO patch monitor will be mailed 3 day USPS to your address on file. It may take 3-5 days  to receive your monitor after you have been enrolled.  Once you have received your monitor, please review the enclosed instructions. Your monitor  has already been registered assigning a specific monitor serial # to you.  Billing and Patient Assistance Program Information  We have supplied [employer] with any of your insurance information on file for billing purposes. [employer] offers a sliding scale Patient Assistance Program for patients that do not have  insurance, or whose insurance does not completely cover the cost of the ZIO monitor.  You must apply for the Patient Assistance Program to qualify for this discounted rate.  To apply, please call [employer] at [phone], select option 4, select option 2, ask to apply for  Patient Assistance Program. Meredeth Ide will ask your household income, and how many people  are in your household. They will quote your out-of-pocket cost based on that information.  [employer] will also be able to set up a 6-month, interest-free payment plan if needed.  Applying the monitor   Shave hair from upper left chest.  Hold abrader disc by orange tab. Rub abrader in 40 strokes over the upper left chest as  indicated in your monitor instructions.  Clean area with 4 enclosed alcohol pads. Let dry.  Apply patch  as indicated in monitor instructions. Patch will be placed under collarbone on left  side of chest with arrow pointing upward.  Rub patch adhesive wings for 2 minutes. Remove white label marked "1". Remove the white  label marked "2". Rub patch adhesive wings for 2 additional minutes.  While looking in a mirror, press and release button in center of patch. A small  green light will  flash 3-4 times. This will be your only indicator that the monitor has been turned on.  Do not shower for the first 24 hours. You may shower after the first 24 hours.  Press the button if you feel a symptom. You will hear a small click. Record Date, Time and  Symptom in the Patient Logbook.  When you are ready to remove the patch, follow instructions on the last 2 pages of Patient  Logbook. Stick patch monitor onto the last page of Patient Logbook.  Place Patient Logbook in the blue and white box. Use locking tab on box and tape box closed  securely. The blue and white box has prepaid postage on it. Please place it in the mailbox as  soon as possible. Your physician should have your test results approximately 7 days after the  monitor has been mailed back to [facility].  Call [facility] Customer Care at [phone] if you have questions regarding  your ZIO XT patch monitor. Call them immediately if you see an orange light blinking on your  monitor.  If your monitor falls off in less than 4 days, contact our Monitor department at [phone].  If your monitor becomes loose or falls off after 4 days call [employer] at [phone] for  suggestions on securing your monitor    Follow-Up: At [facility], you and your health needs are our priority.  As part of our continuing mission to provide you with exceptional heart care, we have created designated Provider Care Teams.  These Care Teams include your primary Cardiologist (physician) and Advanced Practice Providers (APPs -  Physician Assistants and Nurse Practitioners) who all work together to provide you with the care you need, when you need it.  We recommend signing up for the patient portal called "MyChart".  Sign up information is provided on this After Visit Summary.  MyChart is used to connect with patients for Virtual Visits (Telemedicine).  Patients are able to view lab/test results, encounter notes,  upcoming appointments, etc.  Non-urgent messages can be sent to your provider as well.   To learn more about what you can do with MyChart, go to [email or web address].    Your next appointment:   6 month(s)  Provider:   Dr. Bjorn Pippin   Other Instructions none

## 2023-03-01 NOTE — Progress Notes (Unsigned)
Enrolled for [employer] to mail a ZIO XT long term holter monitor to the patients address on file.  

## 2023-03-08 DIAGNOSIS — I491 Atrial premature depolarization: Secondary | ICD-10-CM

## 2023-03-19 DIAGNOSIS — I491 Atrial premature depolarization: Secondary | ICD-10-CM | POA: Diagnosis not present

## 2023-03-19 DIAGNOSIS — D2262 Melanocytic nevi of left upper limb, including shoulder: Secondary | ICD-10-CM | POA: Diagnosis not present

## 2023-03-19 DIAGNOSIS — L570 Actinic keratosis: Secondary | ICD-10-CM | POA: Diagnosis not present

## 2023-03-19 DIAGNOSIS — D225 Melanocytic nevi of trunk: Secondary | ICD-10-CM | POA: Diagnosis not present

## 2023-03-19 DIAGNOSIS — Z85828 Personal history of other malignant neoplasm of skin: Secondary | ICD-10-CM | POA: Diagnosis not present

## 2023-03-19 DIAGNOSIS — R52 Pain, unspecified: Secondary | ICD-10-CM | POA: Diagnosis not present

## 2023-03-19 DIAGNOSIS — L820 Inflamed seborrheic keratosis: Secondary | ICD-10-CM | POA: Diagnosis not present

## 2023-03-19 DIAGNOSIS — D1801 Hemangioma of skin and subcutaneous tissue: Secondary | ICD-10-CM | POA: Diagnosis not present

## 2023-03-19 DIAGNOSIS — L821 Other seborrheic keratosis: Secondary | ICD-10-CM | POA: Diagnosis not present

## 2023-03-19 DIAGNOSIS — D485 Neoplasm of uncertain behavior of skin: Secondary | ICD-10-CM | POA: Diagnosis not present

## 2023-03-24 ENCOUNTER — Encounter: Payer: Self-pay | Admitting: Internal Medicine

## 2023-03-30 ENCOUNTER — Other Ambulatory Visit: Payer: Self-pay | Admitting: Internal Medicine

## 2023-03-30 MED ORDER — CLOTRIMAZOLE-BETAMETHASONE 1-0.05 % EX CREA: TOPICAL_CREAM | CUTANEOUS | 2 refills | Status: DC

## 2023-04-01 ENCOUNTER — Other Ambulatory Visit: Payer: Self-pay | Admitting: Internal Medicine

## 2023-04-01 MED ORDER — GABAPENTIN 100 MG PO CAPS: 200.0000 mg | ORAL_CAPSULE | Freq: Three times a day (TID) | ORAL | 3 refills | Status: AC

## 2023-04-15 ENCOUNTER — Other Ambulatory Visit: Payer: Self-pay | Admitting: Internal Medicine

## 2023-04-15 MED ORDER — CLOTRIMAZOLE-BETAMETHASONE 1-0.05 % EX CREA: TOPICAL_CREAM | Freq: Two times a day (BID) | CUTANEOUS | 1 refills | Status: DC

## 2023-04-17 ENCOUNTER — Other Ambulatory Visit: Payer: Self-pay | Admitting: Internal Medicine

## 2023-04-18 DIAGNOSIS — G35 Multiple sclerosis: Secondary | ICD-10-CM | POA: Diagnosis not present

## 2023-05-01 ENCOUNTER — Ambulatory Visit (INDEPENDENT_AMBULATORY_CARE_PROVIDER_SITE_OTHER): Payer: Medicare Other | Admitting: Internal Medicine

## 2023-05-01 ENCOUNTER — Encounter: Payer: Self-pay | Admitting: Internal Medicine

## 2023-05-01 VITALS — BP 124/80 | HR 82 | Temp 98.8°F | Ht 67.0 in | Wt 166.0 lb

## 2023-05-01 DIAGNOSIS — G35 Multiple sclerosis: Secondary | ICD-10-CM | POA: Diagnosis not present

## 2023-05-01 DIAGNOSIS — E291 Testicular hypofunction: Secondary | ICD-10-CM | POA: Diagnosis not present

## 2023-05-01 DIAGNOSIS — M5440 Lumbago with sciatica, unspecified side: Secondary | ICD-10-CM

## 2023-05-01 DIAGNOSIS — G8929 Other chronic pain: Secondary | ICD-10-CM

## 2023-05-01 DIAGNOSIS — E559 Vitamin D deficiency, unspecified: Secondary | ICD-10-CM

## 2023-05-01 DIAGNOSIS — G8220 Paraplegia, unspecified: Secondary | ICD-10-CM

## 2023-05-01 DIAGNOSIS — F439 Reaction to severe stress, unspecified: Secondary | ICD-10-CM | POA: Insufficient documentation

## 2023-05-01 MED ORDER — TESTOSTERONE 50 MG/5GM (1%) TD GEL: 10.0000 g | Freq: Every day | TRANSDERMAL | 2 refills | Status: DC

## 2023-05-01 MED ORDER — ATORVASTATIN CALCIUM 10 MG PO TABS: 10.0000 mg | ORAL_TABLET | Freq: Every day | ORAL | 3 refills | Status: DC

## 2023-05-01 NOTE — Assessment & Plan Note (Signed)
 In a power w/c Tramadol ER every day, Gabapentin for pain

## 2023-05-01 NOTE — Assessment & Plan Note (Signed)
Cont on Tramadol ER ?Potential benefits of a long term Tramadol/opioids use as well as potential risks (i.e. addiction risk, apnea etc) and complications (i.e. Somnolence, constipation and others) were explained to the patient and were aknowledged. ?Cont on Baclofen ?

## 2023-05-01 NOTE — Assessment & Plan Note (Signed)
 On Vit D

## 2023-05-01 NOTE — Progress Notes (Signed)
 Subjective:  Patient ID: [name], male    DOB: Apr 16, 1955  Age: 68 y.o. MRN: [id number]  CC: Medical Management of Chronic Issues (6 Month Follow Up)   HPI [name] presents for MS, chronic pain, hypogonadism Wife [name] has PE, CHF, a lot of bone fractures, lupus - in hospice now. Coping OK   Outpatient Medications Prior to Visit  Medication Sig Dispense Refill   amitriptyline (ELAVIL) 25 MG tablet TAKE 2 TABLETS AT BEDTIME AS NEEDED FOR SLEEP 180 tablet 3   aspirin EC 81 MG tablet Take 1 tablet (81 mg total) by mouth daily. 100 tablet 3   baclofen (LIORESAL) 20 MG tablet TAKE 1 TABLET FOUR TIMES A DAY AS NEEDED FOR MUSCLE SPASMS 360 tablet 3   cetirizine (ZYRTEC) 10 MG tablet Take 10 mg by mouth daily as needed for allergies.     cholecalciferol (VITAMIN D) 1000 units tablet Take 1,000 Units by mouth daily.     clotrimazole-betamethasone (LOTRISONE) cream Apply topically 2 (two) times daily. 60 g 1   CONZIP 200 MG CP24 TAKE 1 CAPSULE DAILY 90 capsule 1   fluticasone (FLONASE) 50 MCG/ACT nasal spray Place 2 sprays into both nostrils daily as needed for allergies or rhinitis.     gabapentin (NEURONTIN) 100 MG capsule Take 2 capsules (200 mg total) by mouth 3 (three) times daily. 540 capsule 3   ketorolac (TORADOL) 10 MG tablet Take 1 tablet (10 mg total) by mouth every 8 (eight) hours as needed for severe pain. 20 tablet 1   Multiple Vitamin (MULTIVITAMIN) capsule Take 1 tablet by mouth daily.     pantoprazole (PROTONIX) 40 MG tablet Take 1 tablet (40 mg total) by mouth daily. Annual appt due in Sept must see provider for future refills 90 tablet 3   Probiotic Product (ALIGN) 4 MG CAPS Take 1 capsule (4 mg total) by mouth daily. 90 capsule 3   tadalafil (CIALIS) 5 MG tablet Take 1 tablet (5 mg total) by mouth daily. 90 tablet 3   furosemide (LASIX) 20 MG tablet Take 1 tablet (20 mg total) by mouth daily. (Patient not taking: Reported on 05/01/2023) 30 tablet 3   atorvastatin  (LIPITOR) 10 MG tablet Take 1 tablet (10 mg total) by mouth daily. 90 tablet 3   HYDROcodone bit-homatropine (HYCODAN) 5-1.5 MG/5ML syrup Take 5 mLs by mouth at bedtime as needed for cough. (Patient not taking: Reported on 05/01/2023) 120 mL 0   testosterone (ANDROGEL) 50 MG/5GM (1%) GEL Place 10 g onto the skin daily. (Patient not taking: Reported on 05/01/2023) 300 g 5   No facility-administered medications prior to visit.    ROS: Review of Systems  Constitutional:  Positive for fatigue. Negative for appetite change and unexpected weight change.  HENT:  Negative for congestion, nosebleeds, sneezing, sore throat and trouble swallowing.   Eyes:  Negative for itching and visual disturbance.  Respiratory:  Negative for cough.   Cardiovascular:  Negative for chest pain, palpitations and leg swelling.  Gastrointestinal:  Negative for abdominal distention, blood in stool, diarrhea and nausea.  Genitourinary:  Negative for frequency and hematuria.  Musculoskeletal:  Negative for back pain, gait problem, joint swelling and neck pain.  Skin:  Negative for rash.  Neurological:  Positive for weakness and numbness. Negative for dizziness, tremors and speech difficulty.  Hematological:  Does not bruise/bleed easily.  Psychiatric/Behavioral:  Negative for agitation, decreased concentration, dysphoric mood, sleep disturbance and suicidal ideas. The patient is not nervous/anxious.  Objective:  BP 124/80   Pulse 82   Temp 98.8 F (37.1 C)   Ht 5\' 7"  (1.702 m)   Wt 166 lb (75.3 kg)   SpO2 94%   BMI 26.00 kg/m   BP Readings from Last 3 Encounters:  05/01/23 124/80  03/01/23 (!) 149/84  12/01/22 110/65    Wt Readings from Last 3 Encounters:  05/01/23 166 lb (75.3 kg)  03/01/23 175 lb 3.2 oz (79.5 kg)  12/01/22 171 lb (77.6 kg)    Physical Exam Constitutional:      General: He is not in acute distress.    Appearance: He is well-developed. He is not ill-appearing or toxic-appearing.      Comments: NAD  Eyes:     Conjunctiva/sclera: Conjunctivae normal.     Pupils: Pupils are equal, round, and reactive to light.  Neck:     Thyroid: No thyromegaly.     Vascular: No JVD.  Cardiovascular:     Rate and Rhythm: Normal rate and regular rhythm.     Heart sounds: Normal heart sounds. No murmur heard.    No friction rub. No gallop.  Pulmonary:     Effort: Pulmonary effort is normal. No respiratory distress.     Breath sounds: Normal breath sounds. No wheezing or rales.  Chest:     Chest wall: No tenderness.  Abdominal:     General: Bowel sounds are normal. There is no distension.     Palpations: Abdomen is soft. There is no mass.     Tenderness: There is no abdominal tenderness. There is no guarding or rebound.  Musculoskeletal:        General: No tenderness. Normal range of motion.     Cervical back: Normal range of motion.  Lymphadenopathy:     Cervical: No cervical adenopathy.  Skin:    General: Skin is warm and dry.     Findings: No rash.  Neurological:     Mental Status: He is alert and oriented to person, place, and time.     Cranial Nerves: No cranial nerve deficit.     Motor: Weakness present. No abnormal muscle tone.     Coordination: Coordination normal.     Gait: Gait abnormal.     Deep Tendon Reflexes: Reflexes are normal and symmetric.  Psychiatric:        Behavior: Behavior normal.        Thought Content: Thought content normal.        Judgment: Judgment normal.   In a power w/c today  Lab Results  Component Value Date   WBC 5.8 11/01/2022   HGB 15.0 11/01/2022   HCT 45.7 11/01/2022   PLT 158.0 11/01/2022   GLUCOSE 97 11/01/2022   CHOL 121 11/01/2022   TRIG 65.0 11/01/2022   HDL 53.40 11/01/2022   LDLDIRECT 118.4 06/09/2009   LDLCALC 55 11/01/2022   ALT 28 11/01/2022   AST 27 11/01/2022   NA 139 11/01/2022   K 4.1 11/01/2022   CL 103 11/01/2022   CREATININE 0.82 11/01/2022   BUN 15 11/01/2022   CO2 29 11/01/2022   TSH 2.44 11/01/2022    PSA 3.37 04/04/2022   INR 1.0 08/24/2008   HGBA1C 5.8 07/11/2019    VAS [place] LOWER EXTREMITY VENOUS (DVT) Result Date: 09/05/2022  Lower Venous DVT Study Patient Name:  [name]  Date of Exam:   09/05/2022 Medical Rec #: [id number]      Accession #:    [id number] Date of Birth:  [birth year]     Patient Gender: M Patient Age:   89 years Exam Location:  [facility] Procedure:      VAS [place] LOWER EXTREMITY VENOUS (DVT) Referring Phys: Epifanio Lesches --------------------------------------------------------------------------------  Indications: Patient has multiple sclerosis with left leg swelling. He denies chest pain and SOB.  Limitations: Body habitus. Comparison Study: None Performing Technologist: Alecia Mackin RVT, RDCS (AE), RDMS  Examination Guidelines: A complete evaluation includes B-mode imaging, spectral Doppler, color Doppler, and power Doppler as needed of all accessible portions of each vessel. Bilateral testing is considered an integral part of a complete examination. Limited examinations for reoccurring indications may be performed as noted. The reflux portion of the exam is performed with the patient in reverse Trendelenburg.  +-----+---------------+---------+-----------+----------+--------------+ RIGHTCompressibilityPhasicitySpontaneityPropertiesThrombus Aging +-----+---------------+---------+-----------+----------+--------------+ CFV  Full           Yes      Yes                                 +-----+---------------+---------+-----------+----------+--------------+   +-------+---------------+---------+-----------+----------+--------------+ LEFT   CompressibilityPhasicitySpontaneityPropertiesThrombus Aging +-------+---------------+---------+-----------+----------+--------------+ CFV    Full           Yes      Yes                                 +-------+---------------+---------+-----------+----------+--------------+ SFJ    Full           Yes      Yes                                  +-------+---------------+---------+-----------+----------+--------------+ FV ProxFull           Yes      Yes                                 +-------+---------------+---------+-----------+----------+--------------+ FV Mid Full           Yes      Yes                                 +-------+---------------+---------+-----------+----------+--------------+ PFV    Full                                                        +-------+---------------+---------+-----------+----------+--------------+ POP    Full           Yes      Yes                                 +-------+---------------+---------+-----------+----------+--------------+ PTV    Full           Yes      Yes                                 +-------+---------------+---------+-----------+----------+--------------+ PERO   Full           Yes  Yes                                 +-------+---------------+---------+-----------+----------+--------------+ GSV    Full           Yes      Yes                                 +-------+---------------+---------+-----------+----------+--------------+    Findings reported to Dr. Jerene Pitch thru secure chat at 1:05 pm.  Summary: RIGHT: - No evidence of common femoral vein obstruction.  LEFT: - No evidence of deep vein thrombosis in the lower extremity. No indirect evidence of obstruction proximal to the inguinal ligament. - Technically challenging due to patient's inability to move leg or bend knee. The veins that were seen were all negative for DVT.  *See table(s) above for measurements and observations. Electronically signed by Waverly Ferrari MD on 09/05/2022 at 8:14:16 PM.    Final     Assessment & Plan:   Problem List Items Addressed This Visit     Vitamin D deficiency   On Vit D      Multiple sclerosis (HCC) - Primary   100[place] In a power w/c Tramadol prn       Low back pain   Cont on Tramadol ER Potential  benefits of a long term Tramadol/opioids use as well as potential risks (i.e. addiction risk, apnea etc) and complications (i.e. Somnolence, constipation and others) were explained to the patient and were aknowledged. 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 Coping OK         Meds ordered this encounter  Medications   atorvastatin (LIPITOR) 10 MG tablet    Sig: Take 1 tablet (10 mg total) by mouth daily.    Dispense:  90 tablet    Refill:  3   testosterone (ANDROGEL) 50 MG/5GM (1%) GEL    Sig: Place 10 g onto the skin daily.    Dispense:  300 g    Refill:  2      Follow-up: Return in about 6 months (around 11/01/2023) for Wellness Exam.  Sonda Primes, MD

## 2023-05-01 NOTE — Assessment & Plan Note (Signed)
 Wife [name] has PE, CHF, a lot of bone fractures, lupus - in hospice now. Coping OK

## 2023-05-01 NOTE — Assessment & Plan Note (Signed)
 Not using testosterone gel yet Labs q 6 months - PSA, CBC, LFTs

## 2023-05-01 NOTE — Assessment & Plan Note (Signed)
 100% [place] - [place] In a power w/c Tramadol prn

## 2023-05-03 DIAGNOSIS — H0288A Meibomian gland dysfunction right eye, upper and lower eyelids: Secondary | ICD-10-CM | POA: Diagnosis not present

## 2023-05-03 DIAGNOSIS — H0102B Squamous blepharitis left eye, upper and lower eyelids: Secondary | ICD-10-CM | POA: Diagnosis not present

## 2023-05-03 DIAGNOSIS — H2513 Age-related nuclear cataract, bilateral: Secondary | ICD-10-CM | POA: Diagnosis not present

## 2023-05-03 DIAGNOSIS — H4720 Unspecified optic atrophy: Secondary | ICD-10-CM | POA: Diagnosis not present

## 2023-05-03 DIAGNOSIS — H43811 Vitreous degeneration, right eye: Secondary | ICD-10-CM | POA: Diagnosis not present

## 2023-05-03 DIAGNOSIS — H0288B Meibomian gland dysfunction left eye, upper and lower eyelids: Secondary | ICD-10-CM | POA: Diagnosis not present

## 2023-05-03 DIAGNOSIS — H0102A Squamous blepharitis right eye, upper and lower eyelids: Secondary | ICD-10-CM | POA: Diagnosis not present

## 2023-05-04 ENCOUNTER — Telehealth: Payer: Self-pay | Admitting: Internal Medicine

## 2023-05-04 NOTE — Telephone Encounter (Signed)
 Copied from CRM [id number]. Topic: Clinical - Medication Question >> May 04, 2023 11:41 AM Arley Phenix D wrote: Reason for CRM: Dot Lanes from [facility] is calling to confirm medication for testosterone (ANDROGEL) 50 MG/5GM (1%) GEL. Dot Lanes stated that she needs someone to clarify the medication and also the directions and quantity. Call back number is [phone], Reference number is [id number].

## 2023-05-09 ENCOUNTER — Other Ambulatory Visit: Payer: Self-pay | Admitting: *Deleted

## 2023-05-09 ENCOUNTER — Encounter: Payer: Self-pay | Admitting: *Deleted

## 2023-05-09 MED ORDER — METOPROLOL SUCCINATE ER 25 MG PO TB24: 25.0000 mg | ORAL_TABLET | Freq: Every day | ORAL | 3 refills | Status: DC

## 2023-05-10 NOTE — Telephone Encounter (Signed)
 Called and spoke with pharmacist and confirmed with them the script for the androgel testosterone.

## 2023-05-15 ENCOUNTER — Telehealth: Payer: Self-pay

## 2023-05-15 ENCOUNTER — Other Ambulatory Visit (HOSPITAL_COMMUNITY): Payer: Self-pay

## 2023-05-15 NOTE — Telephone Encounter (Signed)
 Pharmacy Patient Advocate Encounter   Received notification from Onbase that prior authorization for TESTOSTERONE is required/requested.   Insurance verification completed.   The patient is insured through [employer] .   Per test claim: PA required; However, NEW/RECENT labs/notes are needed to complete & submit PA request. Please see below.

## 2023-05-24 ENCOUNTER — Other Ambulatory Visit: Payer: Self-pay

## 2023-05-28 ENCOUNTER — Other Ambulatory Visit: Payer: Self-pay

## 2023-05-28 DIAGNOSIS — E291 Testicular hypofunction: Secondary | ICD-10-CM

## 2023-05-28 NOTE — Telephone Encounter (Signed)
 Called and spoke with patient. Informed them labs have since been placed and that we will require these readings before prior authorization is completed.

## 2023-05-30 ENCOUNTER — Ambulatory Visit (HOSPITAL_COMMUNITY): Payer: Medicare Other | Attending: Cardiovascular Disease

## 2023-05-30 DIAGNOSIS — I2510 Atherosclerotic heart disease of native coronary artery without angina pectoris: Secondary | ICD-10-CM | POA: Insufficient documentation

## 2023-05-30 DIAGNOSIS — I77810 Thoracic aortic ectasia: Secondary | ICD-10-CM | POA: Diagnosis not present

## 2023-05-30 LAB — ECHOCARDIOGRAM COMPLETE
Area-P 1/2: 3.37 cm2
S' Lateral: 3.2 cm

## 2023-07-11 ENCOUNTER — Encounter: Payer: Self-pay | Admitting: Cardiology

## 2023-09-04 ENCOUNTER — Ambulatory Visit (INDEPENDENT_AMBULATORY_CARE_PROVIDER_SITE_OTHER): Payer: Medicare Other

## 2023-09-04 VITALS — Ht 67.0 in | Wt 166.0 lb

## 2023-09-04 DIAGNOSIS — Z0000 Encounter for general adult medical examination without abnormal findings: Secondary | ICD-10-CM | POA: Diagnosis not present

## 2023-09-04 NOTE — Progress Notes (Signed)
 Subjective:   [name] is a 68 y.o. who presents for a Medicare Wellness preventive visit.  As a reminder, Annual Wellness Visits don't include a physical exam, and some assessments may be limited, especially if this visit is performed virtually. We may recommend an in-person follow-up visit with your provider if needed.  Visit Complete: Virtual I connected with  [name] on 09/04/23 by a audio enabled telemedicine application and verified that I am speaking with the correct person using two identifiers.  Patient Location: Home  Provider Location: Home Office  I discussed the limitations of evaluation and management by telemedicine. The patient expressed understanding and agreed to proceed.  Vital Signs: Because this visit was a virtual/telehealth visit, some criteria may be missing or patient reported. Any vitals not documented were not able to be obtained and vitals that have been documented are patient reported.  VideoDeclined- This patient declined Librarian, academic. Therefore the visit was completed with audio only.  Persons Participating in Visit: Patient.  AWV Questionnaire: Yes: Patient Medicare AWV questionnaire was completed by the patient on 08/31/2023; I have confirmed that all information answered by patient is correct and no changes since this date.  Cardiac Risk Factors include: advanced age (>77men, >86 women);male gender;Other (see comment);dyslipidemia, Risk factor comments: Coronary atherosclerosis     Objective:    Today's Vitals   08/31/23 1820 09/04/23 0919  Weight:  166 lb (75.3 kg)  Height:  5' 7 (1.702 m)  PainSc: 3     Body mass index is 26 kg/m.     09/04/2023    9:36 AM 08/29/2022    9:58 AM 04/06/2021    2:19 PM 03/29/2020   11:15 AM 08/29/2019    9:31 PM 08/29/2019    9:30 AM 01/30/2018   10:01 PM  Advanced Directives  Does Patient Have a Medical Advance Directive? Yes Yes Yes Yes Yes No Yes   Type of  Estate agent of [place];Living will Healthcare Power of [place];Living will Living will;Healthcare Power of [employer] Power of [place];Living will  Out of facility DNR (pink MOST or yellow form)  Does patient want to make changes to medical advance directive?   No - Patient declined No - Patient declined No - Patient declined    Copy of Healthcare Power of Attorney in Chart? No - copy requested No - copy requested No - copy requested  No - copy requested    Would patient like information on creating a medical advance directive?     No - Patient declined No - Patient declined      Data saved with a previous flowsheet row definition    Current Medications (verified) Outpatient Encounter Medications as of 09/04/2023  Medication Sig   amitriptyline  (ELAVIL ) 25 MG tablet TAKE 2 TABLETS AT BEDTIME AS NEEDED FOR SLEEP   atorvastatin  (LIPITOR) 10 MG tablet Take 1 tablet (10 mg total) by mouth daily.   baclofen  (LIORESAL ) 20 MG tablet TAKE 1 TABLET FOUR TIMES A DAY AS NEEDED FOR MUSCLE SPASMS   cetirizine (ZYRTEC) 10 MG tablet Take 10 mg by mouth daily as needed for allergies.   cholecalciferol  (VITAMIN D ) 1000 units tablet Take 1,000 Units by mouth daily.   clotrimazole -betamethasone  (LOTRISONE ) cream Apply topically 2 (two) times daily.   CONZIP  200 MG CP24 TAKE 1 CAPSULE DAILY   fluticasone  (FLONASE ) 50 MCG/ACT nasal spray Place 2 sprays into both nostrils daily as needed for allergies or rhinitis.  furosemide  (LASIX ) 20 MG tablet Take 1 tablet (20 mg total) by mouth daily.   gabapentin  (NEURONTIN ) 100 MG capsule Take 2 capsules (200 mg total) by mouth 3 (three) times daily.   ketorolac  (TORADOL ) 10 MG tablet Take 1 tablet (10 mg total) by mouth every 8 (eight) hours as needed for severe pain.   metoprolol  succinate (TOPROL  XL) 25 MG 24 hr tablet Take 1 tablet (25 mg total) by mouth daily.   Multiple Vitamin (MULTIVITAMIN) capsule Take 1 tablet by mouth daily.    pantoprazole  (PROTONIX ) 40 MG tablet Take 1 tablet (40 mg total) by mouth daily. Annual appt due in Sept must see provider for future refills   Probiotic Product (ALIGN) 4 MG CAPS Take 1 capsule (4 mg total) by mouth daily.   tadalafil  (CIALIS ) 5 MG tablet Take 1 tablet (5 mg total) by mouth daily.   testosterone  (ANDROGEL ) 50 MG/5GM (1%) GEL Place 10 g onto the skin daily.   No facility-administered encounter medications on file as of 09/04/2023.    Allergies (verified) Meperidine, Ocrevus  [ocrelizumab ], Penicillin g, and Prednisone   History: Past Medical History:  Diagnosis Date   Arthritis    Asthma    C. difficile diarrhea    Gallstone pancreatitis    Hernia    History of inguinal hernia repair    Hypertension    Low back pain    MS (multiple sclerosis) (HCC)    Pancreatitis hosp[italized with gallstone pancreatitis 3/12[   Vitamin D  deficiency    Past Surgical History:  Procedure Laterality Date   BIOPSY  09/02/2019   Procedure: BIOPSY;  Surgeon: Aneita Gwendlyn DASEN, MD;  Location: [facility];  Service: Endoscopy;;   CHOLECYSTECTOMY  04/2010   COLONOSCOPY WITH PROPOFOL  N/A 09/02/2019   Procedure: COLONOSCOPY WITH PROPOFOL ;  Surgeon: Aneita Gwendlyn DASEN, MD;  Location: [facility];  Service: Endoscopy;  Laterality: N/A;   CYSTECTOMY     on back and R hand   CYSTECTOMY     left arm   HERNIA REPAIR     x 2   INGUINAL HERNIA REPAIR  03/17/2011   Procedure: HERNIA REPAIR INGUINAL ADULT;  Surgeon: Elon CHRISTELLA Pacini, MD;  Location: [facility];  Service: General;  Laterality: Left;  Open left inguinal hernia repair with mesh   KNEE ARTHROSCOPY     right   NOSE SURGERY     POLYPECTOMY  09/02/2019   Procedure: POLYPECTOMY;  Surgeon: Aneita Gwendlyn DASEN, MD;  Location: [facility];  Service: Endoscopy;;   TONSILLECTOMY     WISDOM TOOTH EXTRACTION     Family History  Problem Relation Age of Onset   Diabetes Mother    Lung cancer Father        not related to smoking    Colon cancer Neg Hx    Esophageal cancer Neg Hx    Pancreatic cancer Neg Hx    Stomach cancer Neg Hx    Liver disease Neg Hx    Colon polyps Neg Hx    Rectal cancer Neg Hx    Social History   Socioeconomic History   Marital status: Married    Spouse name: Angeline   Number of children: 2   Years of education: Not on file   Highest education level: Associate degree: occupational, Scientist, product/process development, or vocational program  Occupational History   Occupation: Water quality scientist: [employer]   Occupation: RETIRED/Disablied  Tobacco Use   Smoking status: Never   Smokeless tobacco: Never  Vaping Use   Vaping status: Never Used  Substance and Sexual Activity   Alcohol use: No   Drug use: No   Sexual activity: Yes  Other Topics Concern   Not on file  Social History Narrative   Lives with wife/2025   Social Drivers of Health   Financial Resource Strain: Low Risk  (08/31/2023)   Overall Financial Resource Strain (CARDIA)    Difficulty of Paying Living Expenses: Not hard at all  Food Insecurity: No Food Insecurity (08/31/2023)   Hunger Vital Sign    Worried About Running Out of Food in the Last Year: Never true    Ran Out of Food in the Last Year: Never true  Transportation Needs: No Transportation Needs (08/31/2023)   PRAPARE - Administrator, Civil Service (Medical): No    Lack of Transportation (Non-Medical): No  Physical Activity: Inactive (08/31/2023)   Exercise Vital Sign    Days of Exercise per Week: 0 days    Minutes of Exercise per Session: Not on file  Stress: No Stress Concern Present (08/31/2023)   [employer] of Occupational Health - Occupational Stress Questionnaire    Feeling of Stress: Not at all  Social Connections: Moderately Integrated (08/31/2023)   Social Connection and Isolation Panel    Frequency of Communication with Friends and Family: More than three times a week    Frequency of Social Gatherings with Friends and Family: Once a week     Attends Religious Services: Never    Database administrator or Organizations: Yes    Attends Banker Meetings: Never    Marital Status: Married    Tobacco Counseling Counseling given: Not Answered    Clinical Intake:  Pre-visit preparation completed: Yes  Pain : 0-10 (Has MS) Pain Score: 3  Pain Type: Chronic pain Pain Onset: More than a month ago Pain Frequency: Constant Pain Relieving Factors: Tylenol  200 mg  Pain Relieving Factors: Tylenol  200 mg  BMI - recorded: 26 Nutritional Status: BMI 25 -29 Overweight Nutritional Risks: Nausea/ vomitting/ diarrhea (gall bladder removed) Diabetes: No  Lab Results  Component Value Date   HGBA1C 5.8 07/11/2019     How often do you need to have someone help you when you read instructions, pamphlets, or other written materials from your doctor or pharmacy?: 1 - Never  Interpreter Needed?: No  Information entered by :: [name], RMA   Activities of Daily Living     08/31/2023    6:20 PM  In your present state of health, do you have any difficulty performing the following activities:  Hearing? 0  Vision? 0  Difficulty concentrating or making decisions? 0  Walking or climbing stairs? 1  Dressing or bathing? 0  Doing errands, shopping? 0  Preparing Food and eating ? Y  Using the Toilet? N  In the past six months, have you accidently leaked urine? Y  Do you have problems with loss of bowel control? N  Managing your Medications? N  Managing your Finances? N  Housekeeping or managing your Housekeeping? Y    Patient Care Team: Garald Karlynn GAILS, MD as PCP - Diedre Clarice Perkins, MD as Referring Physician (Neurology) Teressa Toribio SQUIBB, MD (Inactive) as Attending Physician (Gastroenterology) Octavia Charleston, MD as Consulting Physician (Ophthalmology) Addie Cordella Hamilton, MD as Consulting Physician (Orthopedic Surgery)  I have updated your Care Teams any recent Medical Services you may have received from  other providers in the past year.  Assessment:   This is a routine wellness examination for [place].  Hearing/Vision screen Hearing Screening - Comments:: Denies hearing difficulties   Vision Screening - Comments:: Denies vision issues.Dr Octavia    Goals Addressed               This Visit's Progress     maintain current health status (pt-stated)   On track     Continue to be as active as possible and eat a healthy diet.  Since immobility, MS,  does not get the exercise like I used to.       Depression Screen     09/04/2023    9:37 AM 08/29/2022   10:00 AM 06/27/2022    8:27 AM 05/01/2022    2:17 PM 03/31/2022    1:06 PM 10/07/2021    2:02 PM 09/07/2021    3:36 PM  PHQ 2/9 Scores  PHQ - 2 Score 0 0 0 0 0  0  PHQ- 9 Score 0 0  0 0    Exception Documentation      Patient refusal     Fall Risk     08/31/2023    6:20 PM 08/29/2022    9:58 AM 08/25/2022   11:46 AM 06/27/2022    8:26 AM 05/01/2022    2:16 PM  Fall Risk   Falls in the past year? 1 0 0 0 0  Number falls in past yr: 0 0 0 0 0  Injury with Fall? 0 0 0 0 0  Risk for fall due to : Impaired balance/gait No Fall Risks  No Fall Risks Impaired balance/gait;Impaired mobility  Follow up Falls evaluation completed;Falls prevention discussed Falls prevention discussed  Falls evaluation completed Falls evaluation completed    MEDICARE RISK AT HOME:  Medicare Risk at Home Any stairs in or around the home?: (Patient-Rptd) No If so, are there any without handrails?: (Patient-Rptd) No Home free of loose throw rugs in walkways, pet beds, electrical cords, etc?: (Patient-Rptd) No Adequate lighting in your home to reduce risk of falls?: (Patient-Rptd) Yes Life alert?: (Patient-Rptd) No Use of a cane, walker or w/c?: (Patient-Rptd) Yes Grab bars in the bathroom?: (Patient-Rptd) Yes Shower chair or bench in shower?: (Patient-Rptd) Yes Elevated toilet seat or a handicapped toilet?: (Patient-Rptd) No  TIMED UP AND GO:  Was  the test performed?  No  Cognitive Function: Declined/Normal: No cognitive concerns noted by patient or family. Patient alert, oriented, able to answer questions appropriately and recall recent events. No signs of memory loss or confusion.        08/29/2022    9:58 AM  6CIT Screen  What Year? 0 points  What month? 0 points  What time? 0 points  Count back from 20 0 points  Months in reverse 0 points  Repeat phrase 0 points  Total Score 0 points    Immunizations Immunization History  Administered Date(s) Administered   Influenza Split 01/24/2011   Influenza Whole 11/07/2011   Influenza,inj,Quad PF,6+ Mos 11/15/2016, 11/14/2017, 11/16/2018, 12/08/2019   PFIZER(Purple Top)SARS-COV-2 Vaccination 04/24/2019, 05/20/2019, 09/29/2019   Pneumococcal Conjugate-13 03/27/2014   Pneumococcal Polysaccharide-23 06/14/2009, 07/06/2020   Tdap 09/04/2012   Zoster, Live 03/29/2015    Screening Tests Health Maintenance  Topic Date Due   DTaP/Tdap/Td (2 - Td or Tdap) 09/05/2022   COVID-19 Vaccine (4 - 2024-25 season) 10/08/2022   INFLUENZA VACCINE  09/07/2023   Medicare Annual Wellness (AWV)  09/03/2024   Pneumococcal Vaccine: 50+ Years  Completed   Hepatitis  C Screening  Completed   Hepatitis B Vaccines  Aged Out   HPV VACCINES  Aged Out   Meningococcal B Vaccine  Aged Out   Colonoscopy  Discontinued   Fecal DNA (Cologuard)  Discontinued   Zoster Vaccines- Shingrix  Discontinued    Health Maintenance  Health Maintenance Due  Topic Date Due   DTaP/Tdap/Td (2 - Td or Tdap) 09/05/2022   COVID-19 Vaccine (4 - 2024-25 season) 10/08/2022   Health Maintenance Items Addressed: See Nurse Notes at the end of this note  Additional Screening:  Vision Screening: Recommended annual ophthalmology exams for early detection of glaucoma and other disorders of the eye. Would you like a referral to an eye doctor? No    Dental Screening: Recommended annual dental exams for proper oral  hygiene  Community Resource Referral / Chronic Care Management: CRR required this visit?  No   CCM required this visit?  No   Plan:    I have personally reviewed and noted the following in the patient's chart:   Medical and social history Use of alcohol, tobacco or illicit drugs  Current medications and supplements including opioid prescriptions. Patient is not currently taking opioid prescriptions. Functional ability and status Nutritional status Physical activity Advanced directives List of other physicians Hospitalizations, surgeries, and ER visits in previous 12 months Vitals Screenings to include cognitive, depression, and falls Referrals and appointments  In addition, I have reviewed and discussed with patient certain preventive protocols, quality metrics, and best practice recommendations. A written personalized care plan for preventive services as well as general preventive health recommendations were provided to patient.   Ludene Stokke L Damiya Sandefur, CMA   09/04/2023   After Visit Summary: (MyChart) Due to this being a telephonic visit, the after visit summary with patients personalized plan was offered to patient via MyChart   Notes: Patient is due for a Tdap vaccine.  Patient is up to date on all other health maintenance with no concerns to address today.

## 2023-09-04 NOTE — Patient Instructions (Addendum)
 [name] , Thank you for taking time out of your busy schedule to complete your Annual Wellness Visit with me. I enjoyed our conversation and look forward to speaking with you again next year. I, as well as your care team,  appreciate your ongoing commitment to your health goals. Please review the following plan we discussed and let me know if I can assist you in the future. Your Game plan/ To Do List    Follow up Visits: Next Medicare AWV with our clinical staff: 09/04/2024.   Have you seen your provider in the last 6 months (3 months if uncontrolled diabetes)? Yes Next Office Visit with your provider: 11/01/2023.  Clinician Recommendations:  Aim for 30 minutes of exercise or stretching, 6-8 glasses of water, and 5 servings of fruits and vegetables each day. You are due for a tetanus vaccine and can get that done at your pharmacy. Keep up the good work.      This is a list of the screening recommended for you and due dates:  Health Maintenance  Topic Date Due   DTaP/Tdap/Td vaccine (2 - Td or Tdap) 09/05/2022   COVID-19 Vaccine (4 - 2024-25 season) 10/08/2022   Flu Shot  09/07/2023   Medicare Annual Wellness Visit  09/03/2024   Pneumococcal Vaccine for age over 84  Completed   Hepatitis C Screening  Completed   Hepatitis B Vaccine  Aged Out   HPV Vaccine  Aged Out   Meningitis B Vaccine  Aged Out   Colon Cancer Screening  Discontinued   Cologuard (Stool DNA test)  Discontinued   Zoster (Shingles) Vaccine  Discontinued    Advanced directives: (Copy Requested) Please bring a copy of your health care power of attorney and living will to the office to be added to your chart at your convenience. You can mail to [facility] [address] [place], [place] [address] or email to [email or web address] Advance Care Planning is important because it:  [x]  Makes sure you receive the medical care that is consistent with your values, goals, and preferences  [x]  It provides  guidance to your family and loved ones and reduces their decisional burden about whether or not they are making the right decisions based on your wishes.  Follow the link provided in your after visit summary or read over the paperwork we have mailed to you to help you started getting your Advance Directives in place. If you need assistance in completing these, please reach out to us  so that we can help you!  See attachments for Preventive Care and Fall Prevention Tips.

## 2023-10-15 ENCOUNTER — Other Ambulatory Visit: Payer: Self-pay | Admitting: Internal Medicine

## 2023-10-17 ENCOUNTER — Encounter: Payer: Self-pay | Admitting: Internal Medicine

## 2023-11-01 ENCOUNTER — Encounter: Payer: Self-pay | Admitting: Internal Medicine

## 2023-11-01 ENCOUNTER — Ambulatory Visit: Admitting: Internal Medicine

## 2023-11-01 VITALS — BP 138/80 | HR 62 | Resp 18 | Ht 67.0 in | Wt 171.0 lb

## 2023-11-01 DIAGNOSIS — R202 Paresthesia of skin: Secondary | ICD-10-CM

## 2023-11-01 DIAGNOSIS — I2583 Coronary atherosclerosis due to lipid rich plaque: Secondary | ICD-10-CM

## 2023-11-01 DIAGNOSIS — N320 Bladder-neck obstruction: Secondary | ICD-10-CM

## 2023-11-01 DIAGNOSIS — Z23 Encounter for immunization: Secondary | ICD-10-CM | POA: Diagnosis not present

## 2023-11-01 DIAGNOSIS — G35 Multiple sclerosis: Secondary | ICD-10-CM | POA: Diagnosis not present

## 2023-11-01 DIAGNOSIS — E559 Vitamin D deficiency, unspecified: Secondary | ICD-10-CM | POA: Diagnosis not present

## 2023-11-01 DIAGNOSIS — E785 Hyperlipidemia, unspecified: Secondary | ICD-10-CM

## 2023-11-01 DIAGNOSIS — E291 Testicular hypofunction: Secondary | ICD-10-CM

## 2023-11-01 LAB — CBC WITH DIFFERENTIAL/PLATELET
Basophils Absolute: 0 K/uL (ref 0.0–0.1)
Basophils Relative: 0.5 % (ref 0.0–3.0)
Eosinophils Absolute: 0.1 K/uL (ref 0.0–0.7)
Eosinophils Relative: 1.8 % (ref 0.0–5.0)
HCT: 44.7 % (ref 39.0–52.0)
Hemoglobin: 14.8 g/dL (ref 13.0–17.0)
Lymphocytes Relative: 26.4 % (ref 12.0–46.0)
Lymphs Abs: 1.3 K/uL (ref 0.7–4.0)
MCHC: 33.2 g/dL (ref 30.0–36.0)
MCV: 93.8 fl (ref 78.0–100.0)
Monocytes Absolute: 0.4 K/uL (ref 0.1–1.0)
Monocytes Relative: 8.6 % (ref 3.0–12.0)
Neutro Abs: 3 K/uL (ref 1.4–7.7)
Neutrophils Relative %: 62.7 % (ref 43.0–77.0)
Platelets: 151 K/uL (ref 150.0–400.0)
RBC: 4.76 Mil/uL (ref 4.22–5.81)
RDW: 12.2 % (ref 11.5–15.5)
WBC: 4.8 K/uL (ref 4.0–10.5)

## 2023-11-01 LAB — VITAMIN D 25 HYDROXY (VIT D DEFICIENCY, FRACTURES): VITD: 27.51 ng/mL — ABNORMAL LOW (ref 30.00–100.00)

## 2023-11-01 LAB — LIPID PANEL
Cholesterol: 118 mg/dL (ref 0–200)
HDL: 46.4 mg/dL (ref >39.00)
LDL Cholesterol: 59 mg/dL (ref 0–99)
NonHDL: 72.08
Total CHOL/HDL Ratio: 3
Triglycerides: 66 mg/dL (ref 0.0–149.0)
VLDL: 13.2 mg/dL (ref 0.0–40.0)

## 2023-11-01 LAB — URINALYSIS
Bilirubin Urine: NEGATIVE
Hgb urine dipstick: NEGATIVE
Ketones, ur: 15 — AB
Leukocytes,Ua: NEGATIVE
Nitrite: NEGATIVE
Specific Gravity, Urine: >=1.03 — AB (ref 1.000–1.030)
Total Protein, Urine: NEGATIVE
Urine Glucose: NEGATIVE
Urobilinogen, UA: 1 (ref 0.0–1.0)
pH: 6 (ref 5.0–8.0)

## 2023-11-01 LAB — COMPREHENSIVE METABOLIC PANEL WITH GFR
ALT: 22 U/L (ref 0–53)
AST: 20 U/L (ref 0–37)
Albumin: 4.2 g/dL (ref 3.5–5.2)
Alkaline Phosphatase: 72 U/L (ref 39–117)
BUN: 18 mg/dL (ref 6–23)
CO2: 30 meq/L (ref 19–32)
Calcium: 9 mg/dL (ref 8.4–10.5)
Chloride: 102 meq/L (ref 96–112)
Creatinine, Ser: 0.73 mg/dL (ref 0.40–1.50)
GFR: 93.33 mL/min (ref >60.00)
Glucose, Bld: 95 mg/dL (ref 70–99)
Potassium: 4.3 meq/L (ref 3.5–5.1)
Sodium: 140 meq/L (ref 135–145)
Total Bilirubin: 0.6 mg/dL (ref 0.2–1.2)
Total Protein: 6.6 g/dL (ref 6.0–8.3)

## 2023-11-01 LAB — VITAMIN B12: Vitamin B-12: 407 pg/mL (ref 211–911)

## 2023-11-01 LAB — PSA: PSA: 3 ng/mL (ref 0.10–4.00)

## 2023-11-01 LAB — TESTOSTERONE: Testosterone: 215.09 ng/dL — ABNORMAL LOW (ref 300.00–890.00)

## 2023-11-01 LAB — TSH: TSH: 3.07 u[IU]/mL (ref 0.35–5.50)

## 2023-11-01 MED ORDER — CLOTRIMAZOLE-BETAMETHASONE 1-0.05 % EX CREA: TOPICAL_CREAM | Freq: Two times a day (BID) | CUTANEOUS | 1 refills | Status: AC

## 2023-11-01 MED ORDER — BACLOFEN 20 MG PO TABS: ORAL_TABLET | ORAL | 3 refills | Status: AC

## 2023-11-01 MED ORDER — PANTOPRAZOLE SODIUM 40 MG PO TBEC: 40.0000 mg | DELAYED_RELEASE_TABLET | Freq: Every day | ORAL | 3 refills | Status: AC

## 2023-11-01 NOTE — Assessment & Plan Note (Signed)
 100% disability - [employer] In a power w/c - needs repairs Tramadol  prn

## 2023-11-01 NOTE — Progress Notes (Signed)
 Subjective:  Patient ID: [name], male    DOB: 03-17-1955  Age: 68 y.o. MRN: [id number]  CC: Follow-up (6 month f/u/Has some concerns with regards to getting his wheelchair fixed) and Headache (Persistent headache)   HPI [name] presents for MS, chronic pain, dyslipidemia  Outpatient Medications Prior to Visit  Medication Sig Dispense Refill   amitriptyline  (ELAVIL ) 25 MG tablet TAKE 2 TABLETS AT BEDTIME AS NEEDED FOR SLEEP 180 tablet 3   atorvastatin  (LIPITOR) 10 MG tablet Take 1 tablet (10 mg total) by mouth daily. 90 tablet 3   cetirizine (ZYRTEC) 10 MG tablet Take 10 mg by mouth daily as needed for allergies.     cholecalciferol  (VITAMIN D ) 1000 units tablet Take 1,000 Units by mouth daily.     CONZIP  200 MG CP24 TAKE 1 CAPSULE DAILY 90 capsule 1   fluticasone  (FLONASE ) 50 MCG/ACT nasal spray Place 2 sprays into both nostrils daily as needed for allergies or rhinitis.     gabapentin  (NEURONTIN ) 100 MG capsule Take 2 capsules (200 mg total) by mouth 3 (three) times daily. 540 capsule 3   ketorolac  (TORADOL ) 10 MG tablet Take 1 tablet (10 mg total) by mouth every 8 (eight) hours as needed for severe pain. 20 tablet 1   metoprolol  succinate (TOPROL  XL) 25 MG 24 hr tablet Take 1 tablet (25 mg total) by mouth daily. 90 tablet 3   Multiple Vitamin (MULTIVITAMIN) capsule Take 1 tablet by mouth daily.     Probiotic Product (ALIGN) 4 MG CAPS Take 1 capsule (4 mg total) by mouth daily. 90 capsule 3   tadalafil  (CIALIS ) 5 MG tablet Take 1 tablet (5 mg total) by mouth daily. 90 tablet 3   furosemide  (LASIX ) 20 MG tablet Take 1 tablet (20 mg total) by mouth daily. (Patient not taking: Reported on 11/01/2023) 30 tablet 3   testosterone  (ANDROGEL ) 50 MG/5GM (1%) GEL Place 10 g onto the skin daily. (Patient not taking: Reported on 11/01/2023) 300 g 2   baclofen  (LIORESAL ) 20 MG tablet TAKE 1 TABLET FOUR TIMES A DAY AS NEEDED FOR MUSCLE SPASMS 360 tablet 3   clotrimazole -betamethasone   (LOTRISONE ) cream Apply topically 2 (two) times daily. 60 g 1   pantoprazole  (PROTONIX ) 40 MG tablet Take 1 tablet (40 mg total) by mouth daily. Annual appt due in Sept must see provider for future refills 90 tablet 3   No facility-administered medications prior to visit.    ROS: Review of Systems  Constitutional:  Positive for fatigue. Negative for appetite change and unexpected weight change.  HENT:  Negative for congestion, nosebleeds, sneezing, sore throat and trouble swallowing.   Eyes:  Negative for itching and visual disturbance.  Respiratory:  Negative for cough.   Cardiovascular:  Negative for chest pain, palpitations and leg swelling.  Gastrointestinal:  Negative for abdominal distention, blood in stool, diarrhea and nausea.  Genitourinary:  Negative for frequency and hematuria.  Musculoskeletal:  Positive for arthralgias, back pain, gait problem and neck stiffness. Negative for joint swelling and neck pain.  Skin:  Negative for rash.  Neurological:  Negative for dizziness, tremors, speech difficulty and weakness.  Psychiatric/Behavioral:  Negative for agitation, behavioral problems, decreased concentration, dysphoric mood, sleep disturbance and suicidal ideas. The patient is not nervous/anxious.     Objective:  BP 138/80 (BP Location: Left Arm, Patient Position: Sitting)   Pulse 62   Resp 18   Ht 5' 7 (1.702 m)   Wt 171 lb (77.6 kg)   SpO2  95%   BMI 26.78 kg/m   BP Readings from Last 3 Encounters:  11/01/23 138/80  05/01/23 124/80  03/01/23 (!) 149/84    Wt Readings from Last 3 Encounters:  11/01/23 171 lb (77.6 kg)  09/04/23 166 lb (75.3 kg)  05/01/23 166 lb (75.3 kg)    Physical Exam Constitutional:      General: He is not in acute distress.    Comments: NAD  Eyes:     Conjunctiva/sclera: Conjunctivae normal.     Pupils: Pupils are equal, round, and reactive to light.  Neck:     Thyroid : No thyromegaly.     Vascular: No JVD.  Cardiovascular:      Rate and Rhythm: Normal rate and regular rhythm.     Heart sounds: Normal heart sounds. No murmur heard.    No friction rub. No gallop.  Pulmonary:     Effort: Pulmonary effort is normal. No respiratory distress.     Breath sounds: Normal breath sounds. No wheezing, rhonchi or rales.  Chest:     Chest wall: No tenderness.  Abdominal:     General: Bowel sounds are normal. There is no distension.     Palpations: Abdomen is soft. There is no mass.     Tenderness: There is no abdominal tenderness. There is no guarding or rebound.  Musculoskeletal:        General: No tenderness. Normal range of motion.     Cervical back: Normal range of motion.  Lymphadenopathy:     Cervical: No cervical adenopathy.  Skin:    General: Skin is warm and dry.     Findings: Rash present.  Neurological:     Mental Status: He is alert and oriented to person, place, and time. Mental status is at baseline.     Cranial Nerves: No cranial nerve deficit.     Motor: Weakness present. No abnormal muscle tone.     Coordination: Coordination normal.     Gait: Gait abnormal.     Deep Tendon Reflexes: Reflexes abnormal.  Psychiatric:        Mood and Affect: Mood normal.        Behavior: Behavior normal.        Thought Content: Thought content normal.        Judgment: Judgment normal.   In a power w/c - batteries discharge after 75%  Lab Results  Component Value Date   WBC 5.8 11/01/2022   HGB 15.0 11/01/2022   HCT 45.7 11/01/2022   PLT 158.0 11/01/2022   GLUCOSE 97 11/01/2022   CHOL 121 11/01/2022   TRIG 65.0 11/01/2022   HDL 53.40 11/01/2022   LDLDIRECT 118.4 06/09/2009   LDLCALC 55 11/01/2022   ALT 28 11/01/2022   AST 27 11/01/2022   NA 139 11/01/2022   K 4.1 11/01/2022   CL 103 11/01/2022   CREATININE 0.82 11/01/2022   BUN 15 11/01/2022   CO2 29 11/01/2022   TSH 2.44 11/01/2022   PSA 3.37 04/04/2022   INR 1.0 08/24/2008   HGBA1C 5.8 07/11/2019    VAS US  LOWER EXTREMITY VENOUS (DVT) Result  Date: 09/05/2022  Lower Venous DVT Study Patient Name:  [name]  Date of Exam:   09/05/2022 Medical Rec #: [id number]      Accession #:    [id number] Date of Birth: 07/28/1955     Patient Gender: M Patient Age:   83 years Exam Location:  [facility] Procedure:      VAS US  LOWER EXTREMITY VENOUS (  DVT) Referring Phys: LONNI NANAS --------------------------------------------------------------------------------  Indications: Patient has multiple sclerosis with left leg swelling. He denies chest pain and SOB.  Limitations: Body habitus. Comparison Study: None Performing Technologist: Alecia Mackin RVT, RDCS (AE), RDMS  Examination Guidelines: A complete evaluation includes B-mode imaging, spectral Doppler, color Doppler, and power Doppler as needed of all accessible portions of each vessel. Bilateral testing is considered an integral part of a complete examination. Limited examinations for reoccurring indications may be performed as noted. The reflux portion of the exam is performed with the patient in reverse Trendelenburg.  +-----+---------------+---------+-----------+----------+--------------+ RIGHTCompressibilityPhasicitySpontaneityPropertiesThrombus Aging +-----+---------------+---------+-----------+----------+--------------+ CFV  Full           Yes      Yes                                 +-----+---------------+---------+-----------+----------+--------------+   +-------+---------------+---------+-----------+----------+--------------+ LEFT   CompressibilityPhasicitySpontaneityPropertiesThrombus Aging +-------+---------------+---------+-----------+----------+--------------+ CFV    Full           Yes      Yes                                 +-------+---------------+---------+-----------+----------+--------------+ SFJ    Full           Yes      Yes                                 +-------+---------------+---------+-----------+----------+--------------+ FV ProxFull            Yes      Yes                                 +-------+---------------+---------+-----------+----------+--------------+ FV Mid Full           Yes      Yes                                 +-------+---------------+---------+-----------+----------+--------------+ PFV    Full                                                        +-------+---------------+---------+-----------+----------+--------------+ POP    Full           Yes      Yes                                 +-------+---------------+---------+-----------+----------+--------------+ PTV    Full           Yes      Yes                                 +-------+---------------+---------+-----------+----------+--------------+ PERO   Full           Yes      Yes                                 +-------+---------------+---------+-----------+----------+--------------+  GSV    Full           Yes      Yes                                 +-------+---------------+---------+-----------+----------+--------------+    Findings reported to Dr. Victor thru secure chat at 1:05 pm.  Summary: RIGHT: - No evidence of common femoral vein obstruction.  LEFT: - No evidence of deep vein thrombosis in the lower extremity. No indirect evidence of obstruction proximal to the inguinal ligament. - Technically challenging due to patient's inability to move leg or bend knee. The veins that were seen were all negative for DVT.  *See table(s) above for measurements and observations. Electronically signed by Lonni Blade MD on 09/05/2022 at 8:14:16 PM.    Final     Assessment & Plan:   Problem List Items Addressed This Visit     Coronary atherosclerosis - Primary   05/2022 coronary calcium  CT is 56.5.  On Lipitor ASA      Dyslipidemia   Relevant Orders   TSH   Urinalysis   CBC with Differential/Platelet   Lipid panel   PSA   Comprehensive metabolic panel with GFR   Testosterone    Vitamin B12   Hypogonadism in male    Relevant Orders   PSA   Testosterone    MS (multiple sclerosis)   [facility]% [facility] - [facility] In a power w/c - needs repairs Tramadol  prn       Relevant Orders   TSH   Urinalysis   CBC with Differential/Platelet   Lipid panel   PSA   Comprehensive metabolic panel with GFR   Testosterone    Vitamin B12   Paresthesia   Relevant Orders   Vitamin B12   Vitamin D  deficiency   Relevant Orders   VITAMIN D  25 Hydroxy (Vit-D Deficiency, Fractures)   Other Visit Diagnoses       Multiple sclerosis         Bladder neck obstruction       Relevant Orders   PSA         Meds ordered this encounter  Medications   pantoprazole  (PROTONIX ) 40 MG tablet    Sig: Take 1 tablet (40 mg total) by mouth daily. Annual appt due in Sept must see provider for future refills    Dispense:  90 tablet    Refill:  3   baclofen  (LIORESAL ) 20 MG tablet    Sig: TAKE 1 TABLET FOUR TIMES A DAY AS NEEDED FOR MUSCLE SPASMS    Dispense:  360 tablet    Refill:  3   clotrimazole -betamethasone  (LOTRISONE ) cream    Sig: Apply topically 2 (two) times daily.    Dispense:  60 g    Refill:  1      Follow-up: Return in about 3 months (around 01/31/2024) for a follow-up visit.  Marolyn Noel, MD

## 2023-11-01 NOTE — Assessment & Plan Note (Signed)
 05/2022 coronary calcium  CT is 56.5.  On Lipitor ASA

## 2023-11-02 ENCOUNTER — Ambulatory Visit: Payer: Self-pay | Admitting: Internal Medicine

## 2023-11-12 ENCOUNTER — Other Ambulatory Visit: Payer: Self-pay | Admitting: Internal Medicine

## 2023-11-13 NOTE — Telephone Encounter (Signed)
 Message sent to patient

## 2023-11-18 ENCOUNTER — Encounter: Payer: Self-pay | Admitting: Internal Medicine

## 2023-11-20 ENCOUNTER — Other Ambulatory Visit: Payer: Self-pay | Admitting: Internal Medicine

## 2023-11-20 MED ORDER — TRAMADOL HCL (ER BIPHASIC) 200 MG PO CP24: 1.0000 | ORAL_CAPSULE | Freq: Every day | ORAL | 1 refills | Status: DC

## 2023-11-28 ENCOUNTER — Telehealth: Payer: Self-pay

## 2023-11-28 NOTE — Telephone Encounter (Signed)
 Copied from CRM [id number]. Topic: General - Other >> Nov 28, 2023 10:28 AM Revonda D wrote: Reason for CRM: Chole with [facility] stated that they re faxed over some forms on 10/8 and never received the fax back. Chole stated that the forms needs to be signed by Dr.Plotnikov and faxed back.

## 2023-11-30 NOTE — Telephone Encounter (Signed)
 Copied from CRM [id number]. Topic: General - Other >> Nov 28, 2023 10:28 AM Revonda D wrote: Reason for CRM: Chole with [facility] stated that they re faxed over some forms on 10/8 and never received the fax back. Chole stated that the forms needs to be signed by Dr.Plotnikov and faxed back. >> Nov 30, 2023  1:03 PM Robinson H wrote: Patients wife Angeline calling to verify if office received paperwork for patient for repairs to patients power chair that needed signatures from provider, please reach back out.  Angeline [phone]

## 2023-12-03 NOTE — Telephone Encounter (Signed)
 I am pretty sure I signed the form a while ago.  Thanks

## 2023-12-03 NOTE — Telephone Encounter (Signed)
 Upon looking into all paperwork that provider had and what was left that has been completed prior to my return from leave I was not able to locate forms.  I am requesting that the paperwork be re-faxed so I can ensure provider receives it...  Please get the fax number that forms have to be returned to.

## 2023-12-04 ENCOUNTER — Telehealth: Payer: Self-pay

## 2023-12-04 NOTE — Telephone Encounter (Unsigned)
 Copied from CRM [id number]. Topic: Clinical - Order For Equipment >> Dec 04, 2023  2:09 PM Harlene ORN wrote: Reason for CRM: [facility]  On 10/08 faxed over paperwork for PCP to sign. Trying to get that paperwork back since. Last called on 10/22. Titled, [facility]. Needs it to move forward with fixing patient's wheelchair.  Phone: [phone]

## 2024-02-04 ENCOUNTER — Encounter: Payer: Self-pay | Admitting: Internal Medicine

## 2024-02-04 ENCOUNTER — Other Ambulatory Visit: Payer: Self-pay

## 2024-02-04 MED ORDER — METOPROLOL SUCCINATE ER 25 MG PO TB24: 25.0000 mg | ORAL_TABLET | Freq: Every day | ORAL | 3 refills | Status: DC

## 2024-02-04 MED ORDER — TADALAFIL 5 MG PO TABS: 5.0000 mg | ORAL_TABLET | Freq: Every day | ORAL | 3 refills | Status: DC

## 2024-02-04 MED ORDER — ATORVASTATIN CALCIUM 10 MG PO TABS: 10.0000 mg | ORAL_TABLET | Freq: Every day | ORAL | 3 refills | Status: DC

## 2024-02-07 ENCOUNTER — Other Ambulatory Visit: Payer: Self-pay | Admitting: Internal Medicine

## 2024-02-07 MED ORDER — TRAMADOL HCL (ER BIPHASIC) 200 MG PO CP24: 1.0000 | ORAL_CAPSULE | Freq: Every day | ORAL | 1 refills | Status: AC

## 2024-02-07 MED ORDER — TADALAFIL 5 MG PO TABS: 5.0000 mg | ORAL_TABLET | Freq: Every day | ORAL | 3 refills | Status: AC

## 2024-02-07 MED ORDER — ATORVASTATIN CALCIUM 10 MG PO TABS: 10.0000 mg | ORAL_TABLET | Freq: Every day | ORAL | 3 refills | Status: AC

## 2024-02-07 MED ORDER — METOPROLOL SUCCINATE ER 25 MG PO TB24: 25.0000 mg | ORAL_TABLET | Freq: Every day | ORAL | 3 refills | Status: AC

## 2024-02-12 ENCOUNTER — Ambulatory Visit (INDEPENDENT_AMBULATORY_CARE_PROVIDER_SITE_OTHER): Admitting: Internal Medicine

## 2024-02-12 ENCOUNTER — Encounter: Payer: Self-pay | Admitting: Internal Medicine

## 2024-02-12 VITALS — BP 130/86 | HR 67 | Ht 67.0 in | Wt 176.0 lb

## 2024-02-12 DIAGNOSIS — F439 Reaction to severe stress, unspecified: Secondary | ICD-10-CM

## 2024-02-12 DIAGNOSIS — G35D Multiple sclerosis, unspecified: Secondary | ICD-10-CM

## 2024-02-12 DIAGNOSIS — G8929 Other chronic pain: Secondary | ICD-10-CM

## 2024-02-12 DIAGNOSIS — M5440 Lumbago with sciatica, unspecified side: Secondary | ICD-10-CM

## 2024-02-12 DIAGNOSIS — E291 Testicular hypofunction: Secondary | ICD-10-CM | POA: Diagnosis not present

## 2024-02-12 MED ORDER — TESTOSTERONE 50 MG/5GM (1%) TD GEL: 10.0000 g | Freq: Every day | TRANSDERMAL | 2 refills | Status: AC

## 2024-02-12 NOTE — Assessment & Plan Note (Signed)
 Testim  gel Rx did not go through.SABRASABRASent to [facility] this time

## 2024-02-12 NOTE — Progress Notes (Signed)
 "  Subjective:  Patient ID: [name], male    DOB: [birth year]  Age: 69 y.o. MRN: [id number]  CC: Medical Management of Chronic Issues (3 Month follow up)   HPI [name] presents for dyslipidemia, MS - worse, chronic pain, HTN Testim  gel Rx did not go through... C/o being weaker  Outpatient Medications Prior to Visit  Medication Sig Dispense Refill   amitriptyline  (ELAVIL ) 25 MG tablet TAKE 2 TABLETS AT BEDTIME AS NEEDED FOR SLEEP 180 tablet 3   atorvastatin  (LIPITOR) 10 MG tablet Take 1 tablet (10 mg total) by mouth daily. 90 tablet 3   baclofen  (LIORESAL ) 20 MG tablet TAKE 1 TABLET FOUR TIMES A DAY AS NEEDED FOR MUSCLE SPASMS 360 tablet 3   cetirizine (ZYRTEC) 10 MG tablet Take 10 mg by mouth daily as needed for allergies.     cholecalciferol  (VITAMIN D ) 1000 units tablet Take 1,000 Units by mouth daily.     clotrimazole -betamethasone  (LOTRISONE ) cream Apply topically 2 (two) times daily. 60 g 1   fluticasone  (FLONASE ) 50 MCG/ACT nasal spray Place 2 sprays into both nostrils daily as needed for allergies or rhinitis.     gabapentin  (NEURONTIN ) 100 MG capsule Take 2 capsules (200 mg total) by mouth 3 (three) times daily. 540 capsule 3   ketorolac  (TORADOL ) 10 MG tablet Take 1 tablet (10 mg total) by mouth every 8 (eight) hours as needed for severe pain. 20 tablet 1   metoprolol  succinate (TOPROL  XL) 25 MG 24 hr tablet Take 1 tablet (25 mg total) by mouth daily. 90 tablet 3   Multiple Vitamin (MULTIVITAMIN) capsule Take 1 tablet by mouth daily.     pantoprazole  (PROTONIX ) 40 MG tablet Take 1 tablet (40 mg total) by mouth daily. Annual appt due in Sept must see provider for future refills 90 tablet 3   Probiotic Product (ALIGN) 4 MG CAPS Take 1 capsule (4 mg total) by mouth daily. 90 capsule 3   tadalafil  (CIALIS ) 5 MG tablet Take 1 tablet (5 mg total) by mouth daily. 90 tablet 3   TraMADol  HCl (CONZIP ) 200 MG CP24 Take 1 capsule by mouth daily. 90 capsule 1   furosemide  (LASIX ) 20  MG tablet Take 1 tablet (20 mg total) by mouth daily. (Patient not taking: Reported on 02/12/2024) 30 tablet 3   testosterone  (ANDROGEL ) 50 MG/5GM (1%) GEL Place 10 g onto the skin daily. (Patient not taking: Reported on 02/12/2024) 300 g 2   No facility-administered medications prior to visit.    ROS: Review of Systems  Constitutional:  Negative for appetite change, fatigue and unexpected weight change.  HENT:  Negative for congestion, nosebleeds, sneezing, sore throat and trouble swallowing.   Eyes:  Negative for itching and visual disturbance.  Respiratory:  Negative for cough.   Cardiovascular:  Negative for chest pain, palpitations and leg swelling.  Gastrointestinal:  Negative for abdominal distention, blood in stool, diarrhea and nausea.  Genitourinary:  Negative for frequency and hematuria.  Musculoskeletal:  Positive for arthralgias, back pain, gait problem and neck stiffness. Negative for joint swelling and neck pain.  Skin:  Negative for rash.  Neurological:  Positive for weakness. Negative for dizziness, tremors and speech difficulty.  Psychiatric/Behavioral:  Negative for agitation, dysphoric mood, sleep disturbance and suicidal ideas. The patient is not nervous/anxious.     Objective:  BP 130/86   Pulse 67   Ht 5' 7 (1.702 m)   Wt 176 lb (79.8 kg)   SpO2 96%   BMI 27.57  kg/m   BP Readings from Last 3 Encounters:  02/12/24 130/86  11/01/23 138/80  05/01/23 124/80    Wt Readings from Last 3 Encounters:  02/12/24 176 lb (79.8 kg)  11/01/23 171 lb (77.6 kg)  09/04/23 166 lb (75.3 kg)    Physical Exam Constitutional:      General: He is not in acute distress.    Appearance: Normal appearance. He is well-developed.     Comments: NAD  Eyes:     Conjunctiva/sclera: Conjunctivae normal.     Pupils: Pupils are equal, round, and reactive to light.  Neck:     Thyroid : No thyromegaly.     Vascular: No JVD.  Cardiovascular:     Rate and Rhythm: Normal rate. Rhythm  irregular.     Heart sounds: Normal heart sounds. No murmur heard.    No friction rub. No gallop.  Pulmonary:     Effort: Pulmonary effort is normal. No respiratory distress.     Breath sounds: Normal breath sounds. No wheezing or rales.  Chest:     Chest wall: No tenderness.  Abdominal:     General: Bowel sounds are normal. There is no distension.     Palpations: Abdomen is soft. There is no mass.     Tenderness: There is no abdominal tenderness. There is no guarding or rebound.  Musculoskeletal:        General: Tenderness present. Normal range of motion.     Cervical back: Normal range of motion.     Right lower leg: No edema.     Left lower leg: No edema.  Lymphadenopathy:     Cervical: No cervical adenopathy.  Skin:    General: Skin is warm and dry.     Findings: No rash.  Neurological:     Mental Status: He is alert and oriented to person, place, and time.     Cranial Nerves: No cranial nerve deficit.     Motor: Weakness present. No abnormal muscle tone.     Coordination: Coordination abnormal.     Gait: Gait abnormal.     Deep Tendon Reflexes: Reflexes are normal and symmetric.  Psychiatric:        Behavior: Behavior normal.        Thought Content: Thought content normal.        Judgment: Judgment normal.   In a power w/c   Lab Results  Component Value Date   WBC 4.8 11/01/2023   HGB 14.8 11/01/2023   HCT 44.7 11/01/2023   PLT 151.0 11/01/2023   GLUCOSE 95 11/01/2023   CHOL 118 11/01/2023   TRIG 66.0 11/01/2023   HDL 46.40 11/01/2023   LDLDIRECT 118.4 06/09/2009   LDLCALC 59 11/01/2023   ALT 22 11/01/2023   AST 20 11/01/2023   NA 140 11/01/2023   K 4.3 11/01/2023   CL 102 11/01/2023   CREATININE 0.73 11/01/2023   BUN 18 11/01/2023   CO2 30 11/01/2023   TSH 3.07 11/01/2023   PSA 3.00 11/01/2023   INR 1.0 08/24/2008   HGBA1C 5.8 07/11/2019    VAS US  LOWER EXTREMITY VENOUS (DVT) Result Date: 09/05/2022  Lower Venous DVT Study Patient Name:  KWINTON MAAHS  Date of Exam:   09/05/2022 Medical Rec #: [id number]      Accession #:    [id number] Date of Birth: [birth year]     Patient Gender: M Patient Age:   77 years Exam Location:  [facility] Procedure:      VAS US  LOWER  EXTREMITY VENOUS (DVT) Referring Phys: LONNI NANAS --------------------------------------------------------------------------------  Indications: Patient has multiple sclerosis with left leg swelling. He denies chest pain and SOB.  Limitations: Body habitus. Comparison Study: None Performing Technologist: Alecia Mackin RVT, RDCS (AE), RDMS  Examination Guidelines: A complete evaluation includes B-mode imaging, spectral Doppler, color Doppler, and power Doppler as needed of all accessible portions of each vessel. Bilateral testing is considered an integral part of a complete examination. Limited examinations for reoccurring indications may be performed as noted. The reflux portion of the exam is performed with the patient in reverse Trendelenburg.  +-----+---------------+---------+-----------+----------+--------------+ RIGHTCompressibilityPhasicitySpontaneityPropertiesThrombus Aging +-----+---------------+---------+-----------+----------+--------------+ CFV  Full           Yes      Yes                                 +-----+---------------+---------+-----------+----------+--------------+   +-------+---------------+---------+-----------+----------+--------------+ LEFT   CompressibilityPhasicitySpontaneityPropertiesThrombus Aging +-------+---------------+---------+-----------+----------+--------------+ CFV    Full           Yes      Yes                                 +-------+---------------+---------+-----------+----------+--------------+ SFJ    Full           Yes      Yes                                 +-------+---------------+---------+-----------+----------+--------------+ FV ProxFull           Yes      Yes                                  +-------+---------------+---------+-----------+----------+--------------+ FV Mid Full           Yes      Yes                                 +-------+---------------+---------+-----------+----------+--------------+ PFV    Full                                                        +-------+---------------+---------+-----------+----------+--------------+ POP    Full           Yes      Yes                                 +-------+---------------+---------+-----------+----------+--------------+ PTV    Full           Yes      Yes                                 +-------+---------------+---------+-----------+----------+--------------+ PERO   Full           Yes      Yes                                 +-------+---------------+---------+-----------+----------+--------------+  GSV    Full           Yes      Yes                                 +-------+---------------+---------+-----------+----------+--------------+    Findings reported to Dr. Victor thru secure chat at 1:05 pm.  Summary: RIGHT: - No evidence of common femoral vein obstruction.  LEFT: - No evidence of deep vein thrombosis in the lower extremity. No indirect evidence of obstruction proximal to the inguinal ligament. - Technically challenging due to patient's inability to move leg or bend knee. The veins that were seen were all negative for DVT.  *See table(s) above for measurements and observations. Electronically signed by Lonni Blade MD on 09/05/2022 at 8:14:16 PM.    Final     Assessment & Plan:   Problem List Items Addressed This Visit     MS (multiple sclerosis)   Worse In a power w/c more Tramadol  prn Start PT       Relevant Orders   Ambulatory referral to Physical Therapy   Low back pain - Primary   Cont on Tramadol  ER Potential benefits of a long term Tramadol /opioids use as well as potential risks (i.e. addiction risk, apnea etc) and complications (i.e. Somnolence, constipation and  others) were explained to the patient and were aknowledged. Cont on Baclofen       Relevant Orders   Ambulatory referral to Physical Therapy   Hypogonadism in male   Testim  gel Rx did not go through.SABRASABRASent to [facility] this time      Stress   Wife Tillman has PE, CHF, a lot of bone fractures, lupus - off hospice now. On O2 [place]oping [place]         Meds ordered this encounter  Medications   testosterone  (ANDROGEL ) 50 MG/5GM (1%) GEL    Sig: Place 10 g onto the skin daily.    Dispense:  300 g    Refill:  2      Follow-up: Return in about 3 months (around 05/12/2024) for f/u with PCP.  Marolyn Noel, MD "

## 2024-02-12 NOTE — Assessment & Plan Note (Signed)
Cont on Tramadol ER ?Potential benefits of a long term Tramadol/opioids use as well as potential risks (i.e. addiction risk, apnea etc) and complications (i.e. Somnolence, constipation and others) were explained to the patient and were aknowledged. ?Cont on Baclofen ?

## 2024-02-12 NOTE — Assessment & Plan Note (Signed)
 Wife Tillman has PE, CHF, a lot of bone fractures, lupus - off hospice now. On O2 [place]oping [place]

## 2024-02-12 NOTE — Assessment & Plan Note (Signed)
 Worse In a power w/c more Tramadol  prn Start PT

## 2024-02-21 ENCOUNTER — Other Ambulatory Visit (HOSPITAL_COMMUNITY): Payer: Self-pay

## 2024-02-21 ENCOUNTER — Telehealth: Payer: Self-pay

## 2024-02-21 NOTE — Telephone Encounter (Signed)
 Pharmacy Patient Advocate Encounter   Received notification from [facility] that prior authorization for Testosterone  50 mg/5 gm is required/requested.   Insurance verification completed.   The patient is insured through [employer].   Per test claim: PA required; PA submitted to above mentioned insurance via Latent Key/confirmation #/EOC [id number] Status is pending

## 2024-03-07 NOTE — Telephone Encounter (Signed)
 Pharmacy Patient Advocate Encounter  Received notification from [employer] that Prior Authorization for Testosterone  50 mg/5 gm   has been DENIED.  Full denial letter will be uploaded to the media tab. See denial reason below. Coverage is provided in situations where the patient has a confirmed diagnosis of hypogonadism as evidenced by morning total serum testosterone  levels below 300 ng/dL taken on at least two separate occasions OR testosterone  is prescribed by an endocrinologist or urologist who has made the diagnosis of hypogonadism based on unequivocally and consistently low serum total testosterone  or free testosterone  levels. Coverage cannot be authorized at this time. Other coverage conditions apply.   PA #/Case ID/Reference #: [id number]

## 2024-05-13 ENCOUNTER — Ambulatory Visit: Admitting: Internal Medicine

## 2024-09-04 ENCOUNTER — Ambulatory Visit
